# Patient Record
Sex: Female | Born: 1955 | Race: White | Hispanic: No | Smoking: Current every day smoker
Health system: Southern US, Community
[De-identification: ages and names within clinical notes are randomized; demographics above are authoritative.]

## PROBLEM LIST (undated history)

## (undated) DIAGNOSIS — K08109 Complete loss of teeth, unspecified cause, unspecified class: Secondary | ICD-10-CM

## (undated) DIAGNOSIS — Z972 Presence of dental prosthetic device (complete) (partial): Secondary | ICD-10-CM

## (undated) DIAGNOSIS — Z973 Presence of spectacles and contact lenses: Secondary | ICD-10-CM

## (undated) DIAGNOSIS — L905 Scar conditions and fibrosis of skin: Secondary | ICD-10-CM

## (undated) DIAGNOSIS — M797 Fibromyalgia: Secondary | ICD-10-CM

## (undated) DIAGNOSIS — M1990 Unspecified osteoarthritis, unspecified site: Secondary | ICD-10-CM

## (undated) DIAGNOSIS — E785 Hyperlipidemia, unspecified: Secondary | ICD-10-CM

## (undated) DIAGNOSIS — K219 Gastro-esophageal reflux disease without esophagitis: Secondary | ICD-10-CM

## (undated) DIAGNOSIS — K257 Chronic gastric ulcer without hemorrhage or perforation: Secondary | ICD-10-CM

## (undated) DIAGNOSIS — R51 Headache: Secondary | ICD-10-CM

## (undated) DIAGNOSIS — F32A Depression, unspecified: Secondary | ICD-10-CM

## (undated) DIAGNOSIS — K469 Unspecified abdominal hernia without obstruction or gangrene: Secondary | ICD-10-CM

## (undated) DIAGNOSIS — R351 Nocturia: Secondary | ICD-10-CM

## (undated) DIAGNOSIS — A4902 Methicillin resistant Staphylococcus aureus infection, unspecified site: Secondary | ICD-10-CM

## (undated) DIAGNOSIS — K5909 Other constipation: Secondary | ICD-10-CM

## (undated) DIAGNOSIS — F419 Anxiety disorder, unspecified: Secondary | ICD-10-CM

## (undated) DIAGNOSIS — F329 Major depressive disorder, single episode, unspecified: Secondary | ICD-10-CM

## (undated) DIAGNOSIS — I10 Essential (primary) hypertension: Secondary | ICD-10-CM

## (undated) DIAGNOSIS — Z9889 Other specified postprocedural states: Secondary | ICD-10-CM

## (undated) DIAGNOSIS — S069XAA Unspecified intracranial injury with loss of consciousness status unknown, initial encounter: Secondary | ICD-10-CM

## (undated) DIAGNOSIS — R42 Dizziness and giddiness: Secondary | ICD-10-CM

## (undated) DIAGNOSIS — I639 Cerebral infarction, unspecified: Secondary | ICD-10-CM

## (undated) DIAGNOSIS — IMO0001 Reserved for inherently not codable concepts without codable children: Secondary | ICD-10-CM

## (undated) DIAGNOSIS — S069X9A Unspecified intracranial injury with loss of consciousness of unspecified duration, initial encounter: Secondary | ICD-10-CM

## (undated) DIAGNOSIS — K429 Umbilical hernia without obstruction or gangrene: Secondary | ICD-10-CM

## (undated) DIAGNOSIS — G479 Sleep disorder, unspecified: Secondary | ICD-10-CM

## (undated) DIAGNOSIS — Z5189 Encounter for other specified aftercare: Secondary | ICD-10-CM

## (undated) HISTORY — DX: Presence of dental prosthetic device (complete) (partial): K08.109

## (undated) HISTORY — DX: Anxiety disorder, unspecified: F41.9

## (undated) HISTORY — DX: Unspecified osteoarthritis, unspecified site: M19.90

## (undated) HISTORY — DX: Umbilical hernia without obstruction or gangrene: K42.9

## (undated) HISTORY — DX: Chronic gastric ulcer without hemorrhage or perforation: K25.7

## (undated) HISTORY — DX: Complete loss of teeth, unspecified cause, unspecified class: Z97.2

## (undated) HISTORY — DX: Major depressive disorder, single episode, unspecified: F32.9

## (undated) HISTORY — PX: ABDOMINAL HYSTERECTOMY: SHX81

## (undated) HISTORY — DX: Unspecified abdominal hernia without obstruction or gangrene: K46.9

## (undated) HISTORY — DX: Other specified postprocedural states: Z98.890

## (undated) HISTORY — DX: Methicillin resistant Staphylococcus aureus infection, unspecified site: A49.02

## (undated) HISTORY — DX: Presence of spectacles and contact lenses: Z97.3

## (undated) HISTORY — DX: Depression, unspecified: F32.A

## (undated) HISTORY — DX: Fibromyalgia: M79.7

## (undated) HISTORY — DX: Reserved for inherently not codable concepts without codable children: IMO0001

## (undated) HISTORY — PX: APPENDECTOMY: SHX54

## (undated) HISTORY — PX: BACK SURGERY: SHX140

## (undated) HISTORY — DX: Unspecified intracranial injury with loss of consciousness status unknown, initial encounter: S06.9XAA

## (undated) HISTORY — DX: Scar conditions and fibrosis of skin: L90.5

## (undated) HISTORY — DX: Dizziness and giddiness: R42

## (undated) HISTORY — PX: TUBAL LIGATION: SHX77

## (undated) HISTORY — DX: Sleep disorder, unspecified: G47.9

## (undated) HISTORY — DX: Unspecified intracranial injury with loss of consciousness of unspecified duration, initial encounter: S06.9X9A

## (undated) HISTORY — DX: Headache: R51

## (undated) HISTORY — DX: Essential (primary) hypertension: I10

## (undated) HISTORY — DX: Other constipation: K59.09

## (undated) HISTORY — DX: Nocturia: R35.1

## (undated) HISTORY — DX: Gastro-esophageal reflux disease without esophagitis: K21.9

## (undated) HISTORY — DX: Hyperlipidemia, unspecified: E78.5

## (undated) HISTORY — PX: HERNIA REPAIR: SHX51

## (undated) HISTORY — DX: Encounter for other specified aftercare: Z51.89

## (undated) HISTORY — PX: CEREBRAL ANEURYSM REPAIR: SHX164

---

## 1982-07-05 HISTORY — PX: CHOLECYSTECTOMY: SHX55

## 1998-04-08 ENCOUNTER — Ambulatory Visit (HOSPITAL_COMMUNITY): Admission: RE | Admit: 1998-04-08 | Discharge: 1998-04-08 | Payer: Self-pay | Admitting: Specialist

## 1998-04-08 ENCOUNTER — Encounter: Payer: Self-pay | Admitting: Specialist

## 2000-11-22 ENCOUNTER — Encounter: Admission: RE | Admit: 2000-11-22 | Discharge: 2000-11-22 | Payer: Self-pay | Admitting: Neurosurgery

## 2001-03-03 ENCOUNTER — Ambulatory Visit (HOSPITAL_COMMUNITY): Admission: RE | Admit: 2001-03-03 | Discharge: 2001-03-03 | Payer: Self-pay | Admitting: Internal Medicine

## 2001-03-09 ENCOUNTER — Encounter: Admission: RE | Admit: 2001-03-09 | Discharge: 2001-03-09 | Payer: Self-pay | Admitting: Internal Medicine

## 2001-09-19 ENCOUNTER — Encounter: Admission: RE | Admit: 2001-09-19 | Discharge: 2001-09-19 | Payer: Self-pay | Admitting: Internal Medicine

## 2001-09-20 ENCOUNTER — Encounter: Admission: RE | Admit: 2001-09-20 | Discharge: 2001-09-20 | Payer: Self-pay | Admitting: Internal Medicine

## 2001-10-09 ENCOUNTER — Encounter: Payer: Self-pay | Admitting: Specialist

## 2001-10-09 ENCOUNTER — Ambulatory Visit (HOSPITAL_COMMUNITY): Admission: RE | Admit: 2001-10-09 | Discharge: 2001-10-09 | Payer: Self-pay | Admitting: Specialist

## 2001-10-26 ENCOUNTER — Ambulatory Visit (HOSPITAL_COMMUNITY): Admission: RE | Admit: 2001-10-26 | Discharge: 2001-10-26 | Payer: Self-pay | Admitting: Specialist

## 2001-10-26 ENCOUNTER — Encounter: Payer: Self-pay | Admitting: Specialist

## 2001-11-13 ENCOUNTER — Encounter (HOSPITAL_COMMUNITY): Admission: RE | Admit: 2001-11-13 | Discharge: 2001-12-13 | Payer: Self-pay | Admitting: Internal Medicine

## 2001-11-21 ENCOUNTER — Encounter: Admission: RE | Admit: 2001-11-21 | Discharge: 2001-11-21 | Payer: Self-pay | Admitting: Infectious Diseases

## 2002-02-14 ENCOUNTER — Encounter: Admission: RE | Admit: 2002-02-14 | Discharge: 2002-02-14 | Payer: Self-pay | Admitting: Infectious Diseases

## 2002-02-17 ENCOUNTER — Ambulatory Visit (HOSPITAL_COMMUNITY): Admission: RE | Admit: 2002-02-17 | Discharge: 2002-02-17 | Payer: Self-pay | Admitting: Infectious Diseases

## 2002-02-23 ENCOUNTER — Ambulatory Visit (HOSPITAL_COMMUNITY): Admission: RE | Admit: 2002-02-23 | Discharge: 2002-02-23 | Payer: Self-pay | Admitting: Family Medicine

## 2002-02-23 ENCOUNTER — Encounter: Payer: Self-pay | Admitting: Family Medicine

## 2002-04-23 ENCOUNTER — Other Ambulatory Visit: Admission: RE | Admit: 2002-04-23 | Discharge: 2002-04-23 | Payer: Self-pay | Admitting: Dermatology

## 2002-05-03 ENCOUNTER — Ambulatory Visit (HOSPITAL_COMMUNITY): Admission: RE | Admit: 2002-05-03 | Discharge: 2002-05-03 | Payer: Self-pay | Admitting: Internal Medicine

## 2002-05-03 ENCOUNTER — Encounter: Payer: Self-pay | Admitting: Internal Medicine

## 2002-05-08 ENCOUNTER — Encounter: Admission: RE | Admit: 2002-05-08 | Discharge: 2002-05-08 | Payer: Self-pay | Admitting: Infectious Diseases

## 2002-05-08 ENCOUNTER — Ambulatory Visit (HOSPITAL_COMMUNITY): Admission: RE | Admit: 2002-05-08 | Discharge: 2002-05-08 | Payer: Self-pay | Admitting: Internal Medicine

## 2002-05-08 ENCOUNTER — Encounter: Payer: Self-pay | Admitting: Internal Medicine

## 2002-05-11 ENCOUNTER — Encounter: Payer: Self-pay | Admitting: Specialist

## 2002-05-18 ENCOUNTER — Encounter: Payer: Self-pay | Admitting: Specialist

## 2002-05-19 ENCOUNTER — Inpatient Hospital Stay (HOSPITAL_COMMUNITY): Admission: RE | Admit: 2002-05-19 | Discharge: 2002-05-22 | Payer: Self-pay | Admitting: Specialist

## 2002-09-08 ENCOUNTER — Emergency Department (HOSPITAL_COMMUNITY): Admission: EM | Admit: 2002-09-08 | Discharge: 2002-09-09 | Payer: Self-pay | Admitting: Emergency Medicine

## 2002-12-11 ENCOUNTER — Ambulatory Visit (HOSPITAL_COMMUNITY): Admission: RE | Admit: 2002-12-11 | Discharge: 2002-12-11 | Payer: Self-pay | Admitting: Urology

## 2002-12-11 ENCOUNTER — Encounter: Payer: Self-pay | Admitting: Urology

## 2003-03-11 ENCOUNTER — Emergency Department (HOSPITAL_COMMUNITY): Admission: EM | Admit: 2003-03-11 | Discharge: 2003-03-11 | Payer: Self-pay | Admitting: Emergency Medicine

## 2003-03-29 ENCOUNTER — Encounter: Payer: Self-pay | Admitting: Emergency Medicine

## 2003-03-29 ENCOUNTER — Emergency Department (HOSPITAL_COMMUNITY): Admission: EM | Admit: 2003-03-29 | Discharge: 2003-03-29 | Payer: Self-pay

## 2003-03-29 ENCOUNTER — Emergency Department (HOSPITAL_COMMUNITY): Admission: EM | Admit: 2003-03-29 | Discharge: 2003-03-29 | Payer: Self-pay | Admitting: Emergency Medicine

## 2003-06-06 ENCOUNTER — Emergency Department (HOSPITAL_COMMUNITY): Admission: AD | Admit: 2003-06-06 | Discharge: 2003-06-06 | Payer: Self-pay | Admitting: Family Medicine

## 2003-07-24 ENCOUNTER — Emergency Department (HOSPITAL_COMMUNITY): Admission: EM | Admit: 2003-07-24 | Discharge: 2003-07-24 | Payer: Self-pay | Admitting: Emergency Medicine

## 2004-07-05 HISTORY — PX: SPINAL FUSION: SHX223

## 2004-09-24 ENCOUNTER — Ambulatory Visit (HOSPITAL_COMMUNITY): Admission: RE | Admit: 2004-09-24 | Discharge: 2004-09-24 | Payer: Self-pay | Admitting: Family Medicine

## 2004-10-01 ENCOUNTER — Encounter (HOSPITAL_COMMUNITY): Admission: RE | Admit: 2004-10-01 | Discharge: 2004-10-31 | Payer: Self-pay | Admitting: Pediatrics

## 2004-12-28 ENCOUNTER — Inpatient Hospital Stay (HOSPITAL_COMMUNITY): Admission: RE | Admit: 2004-12-28 | Discharge: 2004-12-30 | Payer: Self-pay | Admitting: Obstetrics and Gynecology

## 2004-12-28 ENCOUNTER — Encounter: Payer: Self-pay | Admitting: Obstetrics and Gynecology

## 2005-03-01 ENCOUNTER — Encounter: Admission: RE | Admit: 2005-03-01 | Discharge: 2005-03-01 | Payer: Self-pay | Admitting: Specialist

## 2005-09-21 ENCOUNTER — Ambulatory Visit (HOSPITAL_COMMUNITY): Admission: RE | Admit: 2005-09-21 | Discharge: 2005-09-21 | Payer: Self-pay | Admitting: Family Medicine

## 2005-10-06 ENCOUNTER — Emergency Department (HOSPITAL_COMMUNITY): Admission: EM | Admit: 2005-10-06 | Discharge: 2005-10-06 | Payer: Self-pay | Admitting: Emergency Medicine

## 2006-01-18 ENCOUNTER — Ambulatory Visit (HOSPITAL_COMMUNITY): Admission: RE | Admit: 2006-01-18 | Discharge: 2006-01-18 | Payer: Self-pay | Admitting: Family Medicine

## 2006-01-21 ENCOUNTER — Ambulatory Visit (HOSPITAL_COMMUNITY): Admission: RE | Admit: 2006-01-21 | Discharge: 2006-01-21 | Payer: Self-pay | Admitting: Obstetrics & Gynecology

## 2006-01-24 ENCOUNTER — Ambulatory Visit (HOSPITAL_COMMUNITY): Admission: RE | Admit: 2006-01-24 | Discharge: 2006-01-24 | Payer: Self-pay | Admitting: Family Medicine

## 2006-02-04 ENCOUNTER — Ambulatory Visit: Payer: Self-pay | Admitting: Gastroenterology

## 2006-04-06 ENCOUNTER — Ambulatory Visit (HOSPITAL_COMMUNITY): Admission: RE | Admit: 2006-04-06 | Discharge: 2006-04-06 | Payer: Self-pay | Admitting: Gastroenterology

## 2006-04-13 ENCOUNTER — Ambulatory Visit (HOSPITAL_COMMUNITY): Admission: RE | Admit: 2006-04-13 | Discharge: 2006-04-13 | Payer: Self-pay | Admitting: Gastroenterology

## 2006-12-29 ENCOUNTER — Ambulatory Visit: Payer: Self-pay | Admitting: Gastroenterology

## 2007-01-20 ENCOUNTER — Emergency Department (HOSPITAL_COMMUNITY): Admission: EM | Admit: 2007-01-20 | Discharge: 2007-01-20 | Payer: Self-pay | Admitting: Emergency Medicine

## 2007-03-10 ENCOUNTER — Ambulatory Visit: Payer: Self-pay | Admitting: Gastroenterology

## 2007-04-07 ENCOUNTER — Ambulatory Visit: Payer: Self-pay | Admitting: Gastroenterology

## 2007-04-07 ENCOUNTER — Ambulatory Visit (HOSPITAL_COMMUNITY): Admission: RE | Admit: 2007-04-07 | Discharge: 2007-04-07 | Payer: Self-pay | Admitting: Gastroenterology

## 2007-05-05 ENCOUNTER — Encounter (HOSPITAL_COMMUNITY): Admission: RE | Admit: 2007-05-05 | Discharge: 2007-06-04 | Payer: Self-pay | Admitting: Specialist

## 2007-05-26 ENCOUNTER — Ambulatory Visit: Payer: Self-pay | Admitting: Gastroenterology

## 2008-07-05 HISTORY — PX: EYE SURGERY: SHX253

## 2009-02-13 ENCOUNTER — Ambulatory Visit (HOSPITAL_COMMUNITY): Admission: RE | Admit: 2009-02-13 | Discharge: 2009-02-13 | Payer: Self-pay | Admitting: Ophthalmology

## 2009-04-15 ENCOUNTER — Encounter (HOSPITAL_COMMUNITY): Admission: RE | Admit: 2009-04-15 | Discharge: 2009-05-15 | Payer: Self-pay | Admitting: Specialist

## 2009-06-12 ENCOUNTER — Ambulatory Visit (HOSPITAL_COMMUNITY): Admission: RE | Admit: 2009-06-12 | Discharge: 2009-06-12 | Payer: Self-pay | Admitting: Ophthalmology

## 2009-06-17 ENCOUNTER — Observation Stay (HOSPITAL_COMMUNITY): Admission: RE | Admit: 2009-06-17 | Discharge: 2009-06-17 | Payer: Self-pay | Admitting: Ophthalmology

## 2009-06-19 ENCOUNTER — Observation Stay (HOSPITAL_COMMUNITY): Admission: EM | Admit: 2009-06-19 | Discharge: 2009-06-20 | Payer: Self-pay | Admitting: Emergency Medicine

## 2009-07-28 ENCOUNTER — Emergency Department (HOSPITAL_COMMUNITY): Admission: EM | Admit: 2009-07-28 | Discharge: 2009-07-28 | Payer: Self-pay | Admitting: Emergency Medicine

## 2009-07-30 ENCOUNTER — Emergency Department (HOSPITAL_COMMUNITY): Admission: EM | Admit: 2009-07-30 | Discharge: 2009-07-30 | Payer: Self-pay | Admitting: Emergency Medicine

## 2009-10-31 ENCOUNTER — Emergency Department (HOSPITAL_COMMUNITY): Admission: EM | Admit: 2009-10-31 | Discharge: 2009-10-31 | Payer: Self-pay | Admitting: Emergency Medicine

## 2009-12-28 ENCOUNTER — Inpatient Hospital Stay (HOSPITAL_COMMUNITY)
Admission: EM | Admit: 2009-12-28 | Discharge: 2010-01-01 | Payer: Self-pay | Source: Home / Self Care | Admitting: Emergency Medicine

## 2009-12-31 ENCOUNTER — Ambulatory Visit: Payer: Self-pay | Admitting: Physical Medicine & Rehabilitation

## 2010-01-28 ENCOUNTER — Encounter
Admission: RE | Admit: 2010-01-28 | Discharge: 2010-03-31 | Payer: Self-pay | Admitting: Physical Medicine & Rehabilitation

## 2010-02-03 ENCOUNTER — Encounter
Admission: RE | Admit: 2010-02-03 | Discharge: 2010-02-10 | Payer: Self-pay | Admitting: Physical Medicine & Rehabilitation

## 2010-02-10 ENCOUNTER — Ambulatory Visit: Payer: Self-pay | Admitting: Physical Medicine & Rehabilitation

## 2010-05-13 ENCOUNTER — Encounter
Admission: RE | Admit: 2010-05-13 | Discharge: 2010-05-13 | Payer: Self-pay | Source: Home / Self Care | Attending: Physical Medicine & Rehabilitation | Admitting: Physical Medicine & Rehabilitation

## 2010-06-03 ENCOUNTER — Encounter: Admission: RE | Admit: 2010-06-03 | Discharge: 2010-06-03 | Payer: Self-pay | Admitting: Family Medicine

## 2010-06-09 ENCOUNTER — Ambulatory Visit (HOSPITAL_COMMUNITY): Admission: RE | Admit: 2010-06-09 | Discharge: 2010-06-09 | Payer: Self-pay | Source: Home / Self Care

## 2010-06-19 ENCOUNTER — Encounter
Admission: RE | Admit: 2010-06-19 | Discharge: 2010-06-19 | Payer: Self-pay | Source: Home / Self Care | Attending: General Surgery | Admitting: General Surgery

## 2010-07-16 ENCOUNTER — Inpatient Hospital Stay (HOSPITAL_COMMUNITY)
Admission: RE | Admit: 2010-07-16 | Discharge: 2010-07-17 | Payer: Self-pay | Source: Home / Self Care | Attending: Interventional Radiology | Admitting: Interventional Radiology

## 2010-07-20 LAB — BASIC METABOLIC PANEL
BUN: 7 mg/dL (ref 6–23)
BUN: 4 mg/dL — ABNORMAL LOW (ref 6–23)
CO2: 29 mEq/L (ref 19–32)
CO2: 24 mEq/L (ref 19–32)
Calcium: 9.4 mg/dL (ref 8.4–10.5)
Calcium: 8.6 mg/dL (ref 8.4–10.5)
Chloride: 102 mEq/L (ref 96–112)
Chloride: 107 mEq/L (ref 96–112)
Creatinine, Ser: 0.61 mg/dL (ref 0.4–1.2)
Creatinine, Ser: 0.39 mg/dL — ABNORMAL LOW (ref 0.4–1.2)
GFR calc Af Amer: >60 mL/min (ref >60)
GFR calc Af Amer: >60 mL/min (ref >60)
GFR calc non Af Amer: >60 mL/min (ref >60)
GFR calc non Af Amer: >60 mL/min (ref >60)
Glucose, Bld: 129 mg/dL — ABNORMAL HIGH (ref 70–99)
Glucose, Bld: 149 mg/dL — ABNORMAL HIGH (ref 70–99)
Potassium: 4.1 mEq/L (ref 3.5–5.1)
Potassium: 3.5 mEq/L (ref 3.5–5.1)
Sodium: 140 mEq/L (ref 135–145)
Sodium: 139 mEq/L (ref 135–145)

## 2010-07-20 LAB — GLUCOSE, CAPILLARY
Glucose-Capillary: 154 mg/dL — ABNORMAL HIGH (ref 70–99)
Glucose-Capillary: 163 mg/dL — ABNORMAL HIGH (ref 70–99)
Glucose-Capillary: 260 mg/dL — ABNORMAL HIGH (ref 70–99)
Glucose-Capillary: 128 mg/dL — ABNORMAL HIGH (ref 70–99)
Glucose-Capillary: 162 mg/dL — ABNORMAL HIGH (ref 70–99)
Glucose-Capillary: 145 mg/dL — ABNORMAL HIGH (ref 70–99)

## 2010-07-20 LAB — DIFFERENTIAL
Basophils Absolute: 0 10*3/uL (ref 0.0–0.1)
Basophils Relative: 0 % (ref 0–1)
Eosinophils Absolute: 0.3 10*3/uL (ref 0.0–0.7)
Eosinophils Relative: 4 % (ref 0–5)
Lymphocytes Relative: 21 % (ref 12–46)
Lymphs Abs: 1.8 10*3/uL (ref 0.7–4.0)
Monocytes Absolute: 0.6 10*3/uL (ref 0.1–1.0)
Monocytes Relative: 8 % (ref 3–12)
Neutro Abs: 5.5 10*3/uL (ref 1.7–7.7)
Neutrophils Relative %: 67 % (ref 43–77)

## 2010-07-20 LAB — PLATELET INHIBITION P2Y12
P2Y12 % Inhibition: 6 %
P2Y12 % Inhibition: 0 %
Platelet Function  P2Y12: 205 [PRU] (ref 194–418)
Platelet Function  P2Y12: 259 [PRU] (ref 194–418)
Platelet Function Baseline: 218 [PRU] (ref 194–418)
Platelet Function Baseline: 230 [PRU] (ref 194–418)

## 2010-07-20 LAB — CBC
HCT: 40.5 % (ref 36.0–46.0)
HCT: 35.2 % — ABNORMAL LOW (ref 36.0–46.0)
Hemoglobin: 13.7 g/dL (ref 12.0–15.0)
Hemoglobin: 12 g/dL (ref 12.0–15.0)
MCH: 28.7 pg (ref 26.0–34.0)
MCH: 28.2 pg (ref 26.0–34.0)
MCHC: 33.8 g/dL (ref 30.0–36.0)
MCHC: 34.1 g/dL (ref 30.0–36.0)
MCV: 84.9 fL (ref 78.0–100.0)
MCV: 82.6 fL (ref 78.0–100.0)
Platelets: 159 10*3/uL (ref 150–400)
Platelets: 143 10*3/uL — ABNORMAL LOW (ref 150–400)
RBC: 4.77 MIL/uL (ref 3.87–5.11)
RBC: 4.26 MIL/uL (ref 3.87–5.11)
RDW: 13.9 % (ref 11.5–15.5)
RDW: 13.8 % (ref 11.5–15.5)
WBC: 8.2 10*3/uL (ref 4.0–10.5)
WBC: 8 10*3/uL (ref 4.0–10.5)

## 2010-07-20 LAB — SURGICAL PCR SCREEN
MRSA, PCR: NEGATIVE
Staphylococcus aureus: NEGATIVE

## 2010-07-20 LAB — APTT
aPTT: 27 seconds (ref 24–37)
aPTT: 42 seconds — ABNORMAL HIGH (ref 24–37)

## 2010-07-20 LAB — PROTIME-INR
INR: 0.9 (ref 0.00–1.49)
INR: 1.01 (ref 0.00–1.49)
Prothrombin Time: 12.4 seconds (ref 11.6–15.2)
Prothrombin Time: 13.5 seconds (ref 11.6–15.2)

## 2010-07-20 LAB — HEPARIN LEVEL (UNFRACTIONATED): Heparin Unfractionated: <0.1 IU/mL — ABNORMAL LOW (ref 0.30–0.70)

## 2010-07-25 ENCOUNTER — Encounter: Payer: Self-pay | Admitting: Family Medicine

## 2010-07-26 ENCOUNTER — Encounter: Payer: Self-pay | Admitting: Specialist

## 2010-07-26 ENCOUNTER — Encounter: Payer: Self-pay | Admitting: Family Medicine

## 2010-07-27 NOTE — Discharge Summary (Signed)
Mallory Mendez, Mallory Mendez              ACCOUNT NO.:  [id number]  MEDICAL RECORD NO.:  [id number]          PATIENT TYPE:  INP  LOCATION:  3101                         FACILITY:  [facility]  PHYSICIAN:  Kamden Stanislaw K. Wal[place]ga Hudman, M.D.DATE OF BIRTH:  17-Apr-1956  DATE OF ADMISSION:  07/16/2010 DATE OF DISCHARGE:  07/17/2010                              DISCHARGE SUMMARY   REASON FOR HOSPITALIZATION:  A 55 year old female with history of headaches that worsening since November 2011.  CT angiogram was performed on June 03, 2010, which showed basilar tip aneurysm.  She was referred to Dr. Corliss Skains by Dr. Gerilyn Pilgrim for evaluation and consult.  She was scheduled for angiogram and probable stent placement and coiling of aneurysm to be performed on July 15, 2010.  Basilar artery aneurysm coiling performed on July 16, 2010 without complication.  The patient tolerated well.  Left-sided weakness did occur following procedure.  CT was negative and MRI was negative. Gradual increase in movement over the next several hours.  When the patient was examined by Dr. Corliss Skains at 5:30 p.m. on July 16, 2010, she had much improved movement.  This continued overnight.  The patient reports all fours with good movement equally.  The patient has had slight nausea, but no vomiting.  She is tolerating liquids well and has no other complaints.  Treatment performed on July 16, 1010 was basilar artery aneurysm coiling with Dr. Corliss Skains.  Procedure went without complications.  OBJECTIVE:  Temperature 97.9, blood pressure 122/61, heart rate 80, 100% O2 sat on 2 liters.  Urine output on July 16, 2010 was 2.6 liters, on July 17, 2010 it was 475 mL and yellow.  Labs on July 17, 2010; potassium 3.5, BUN 4, creatinine 0.39, WBC 8, hemoglobin 12, hematocrit 35.2.  PHYSICAL EXAMINATION:   GENERAL:  Extraocular movements intact.  Alert and oriented; the patient is slightly sleepy.  She puffs cheeks  equally, smiles equally.  Face is symmetrical.  Full range of motion in all fours; good grips; push in both feet with left being slightly weaker on grip and push.  Right groin nontender.  No bleeding, no hematoma.  HEART:  Regular rate and rhythm without murmur. LUNGS:  Clear to auscultation. ABDOMEN:  Soft, positive bowel sounds and nontender and no masses.  DISCHARGE MEDICATIONS: 1. Plavix 75 mg. 2. Ambien 10 mg. 3. Amlodipine 10 mg. 4. Humalog 1-10 units. 5. Imipramine 50 mg. 6. Lantus insulin 21 units. 7. Lipitor 40 mg. 8. Lisinopril 20 mg. 9. Lubiprostone mcg. 10.MiraLax daily. 11.MS Contin. 12.Neurontin. 13.Nexium 40 mg. 14.Percocet 10/325 mg. 15.Prefest 1 mg. 16.Pristiq 100 mg. 17.Torsemide 20 mg. 18.Xanax 1 mg. 19.Aspirin 81 mg.  CONDITION AT DISCHARGE:  Stable and improved.  INSTRUCTIONS: 1. The patient is to be restful at home for the next 48 hours.  She     can get around her home and get to the restroom and kitchen. 2. She is to take Plavix 75 mg and aspirin 81 mg daily.3. She is not to drive for 2 weeks. 4. No heavy lifting over 10 pounds or strenuous exercise for 2 weeks. 5. No bending or stooping for 2 weeks.  6. She is to follow up with Dr. Corliss Skains, this appointment is     scheduled for July 30, 2010, which is a Thursday at 3 p.m. 7. She will have a repeat angiogram in 3 months.     Beatrice Cellar Carleene Mains, P.A.   ______________________________ Grandville Silos Corliss Skains, M.D.    PAT/MEDQ  D:  07/20/2010  T:  07/20/2010  Job:  [id number]  Electronically Signed by Ralene Muskrat P.A. on 07/22/2010 04:26:21 PM Electronically Signed by Julieanne Cotton M.D. on 07/27/2010 11:22:57 AM

## 2010-07-27 NOTE — H&P (Signed)
  Mallory Mendez, VALLIN              ACCOUNT NO.:  [id number]  MEDICAL RECORD NO.:  [id number]          PATIENT TYPE:  INP  LOCATION:  3101                         FACILITY:  [facility]  PHYSICIAN:  Gerrard Crystal K. [name], M.D.DATE OF BIRTH:  [birth year]  DATE OF ADMISSION:  07/16/2010 DATE OF DISCHARGE:                             HISTORY & PHYSICAL   This is a 55 year old female with history of headaches, worsening since November 2011.  CT angiogram was performed on June 03, 2010, which showed basilar tip aneurysm.  She was referred to Dr. Corliss Skains by Dr. Gerilyn Pilgrim for evaluation and consults.  This was performed on June 09, 2010.  She is now scheduled today for possible stent and possible coiling of aneurysm.  The patient is agreeable to proceed.  PAST MEDICAL HISTORY: 1. Diabetes. 2. Hyperlipidemia. 3. Hypertension. 4. Diabetic retinopathy. 5. Pancreatitis. 6. Anxiety/depression. 7. History of MRSA post back surgery. 8. Gastroparesis. 9. Fibromyalgia. 10.Previous head injury. 11.Ventral hernia. 12.Smoker.  SURGICAL HISTORY: 1. Cholecystectomy. 2. Hysterectomy. 3. Six back surgeries. 4. Hernia repair. 5. Cataract surgery.  ALLERGIES: 1. LATEX. 2. LYRICA. 3. CYMBALTA.  MEDICATIONS: 1. Lipitor 40 mg. 2. Prefest 1 mg. 3. Percocet 10/325 mg. 4. Nexium 40 mg. 5. Neurontin 800 mg. 6. MS Contin 30 mg. 7. Humalog. 8. Lantus 21 units. 9. Imipramine 50 mg. 10.MiraLax. 11.Lubiprostone. 12.Lisinopril 20 mg. 13.Amlodipine 10 mg. 14.Ambien 10 mg. 15.Pristiq XR 100 mg. 16.Torsemide 20 mg. 17.Xanax 1 mg. 18.Plavix 75 mg. 19.Aspirin 81 mg.  OBJECTIVE:  Extraocular movements intact; alert, appropriate.  Face symmetrical.  Tongue is midline; neuro intact.  HEART:  Regular rate and rhythm without murmur.  LUNGS:  Clear to auscultation.  ABDOMEN:  Soft, positive bowel sounds, nontender, no masses.  EXTREMITIES:  Full range of motion.  Gait is  steady.  ASSESSMENT:  Basilar artery tip aneurysm.  PLAN:  Scheduled for a stent/coiling of aneurysm today with Dr. Corliss Skains.  To be admitted to [address] Neuro ICU overnight.     Bliss Cellar Carleene Mains, P.A.   ______________________________ Grandville Silos Corliss Skains, M.D.    PAT/MEDQ  D:  07/16/2010  T:  07/17/2010  Job:  [id number]  Electronically Signed by Ralene Muskrat P.A. on 07/22/2010 04:24:39 PM Electronically Signed by Julieanne Cotton M.D. on 07/27/2010 11:22:55 AM

## 2010-07-30 LAB — POCT ACTIVATED CLOTTING TIME
Activated Clotting Time: 187 s
Activated Clotting Time: 199 s

## 2010-07-31 ENCOUNTER — Ambulatory Visit (HOSPITAL_COMMUNITY)
Admission: RE | Admit: 2010-07-31 | Discharge: 2010-07-31 | Payer: Self-pay | Source: Home / Self Care | Attending: Interventional Radiology | Admitting: Interventional Radiology

## 2010-07-31 NOTE — Consult Note (Addendum)
[name], [name] NO.:  [id number]  MEDICAL RECORD NO.:  [id number]          PATIENT TYPE:  INP  LOCATION:  3101                         FACILITY:  [facility]  PHYSICIAN:  Marlan Palau, M.D.  DATE OF BIRTH:  02-24-1956  DATE OF CONSULTATION:  07/16/2010 DATE OF DISCHARGE:                                CONSULTATION   HISTORY OF PRESENT ILLNESS:  Mallory Mendez is a 55 year old white female born, 04/16/1956, with a history of diabetes, hypertension, and dyslipidemia.  The patient was found to have a basilar tip aneurysm and has had occipital headaches and was admitted for coiling of the aneurysm.  This procedure was done today.  The aneurysm had to be stented and then was coiled.  The patient seemed to be doing well initially.  Following the procedure about 1:30 p.m. today, the patient was noted to have onset of increased headache and has had some left- sided weakness.  The patient was noted to have some drowsiness as well and was given fentanyl and some steroids.  The patient underwent a CT scan of the brain that was unremarkable and an MRI scan was subsequently done and showed no evidence of hemorrhage or stroke.  The patient was transferred to Neuroscience Intensive Care Unit.  Neurology was asked to see the patient as a code stroke.  The patient was not a tPA candidate secondary to the use of heparin bolus and the fact that no stroke was apparent on MRI.  PAST MEDICAL HISTORY: 1. New onset of lethargy and left-sided weakness. 2. 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 3. Diabetes. 4. Hypertension. 5. Dyslipidemia. 6. Diabetic retinopathy. 7. History of anxiety and depression. 8. Pancreatitis. 9. Chronic low back pain. 10.Fibromyalgia. 11.Gastroparesis. 12.Glaucoma. 13.Cataract surgery bilaterally. 14.Lumbosacral spine surgery on several occasions. 15.Bilateral tubal ligation. 16.History of  vertigo.  ALLERGIES:  The patient has allergies to LATEX, LYRICA and CYMBALTA.  HABITS:  Smokes 1/2-pack cigarettes daily.  Does not drink alcohol.  SOCIAL HISTORY:  The patient is separated, has 5 children, lives in [place], [place] and is on disability.  FAMILY MEDICAL HISTORY:  Mother is living and has a history of breast cancer.  Father died with liver cancer.  REVIEW OF SYSTEMS:  Difficult to obtain.  The patient does report some headache and left-sided numbness.  The patient denies shortness of breath, chest pain, abdominal pain, or nausea.  PHYSICAL EXAMINATION:  VITAL SIGNS:  Blood pressure is 130/88, heart rate 92, respiratory rate 20, and temperature afebrile. GENERAL:  This patient is a fairly well-developed white female who is sleepy at the time examination. HEENT:  Head is atraumatic.  Eyes:  Pupils are round and reactive to light. NECK:  Supple.  No carotid bruits noted. RESPIRATORY:  Clear. CARDIOVASCULAR:  Regular rate and rhythm.  No obvious murmurs or rubs noted. EXTREMITIES:  Without significant edema. NEUROLOGIC:  Cranial nerves as above.  Facial symmetry is present.  The patient has slight dysarthria.  Has full extraocular movements.  Visual fields are full.  Speech is not aphasic.  The patient notes  decreased pinprick sensation on the left lower face compared to right.  The patient has some drift with the left arm and left leg as compared to the right.  Decreased pinprick sensation on the left arm and left leg as compared to the right.  Vibratory sensation is more symmetric.  The patient has fair finger-nose-finger and heel-to-shin bilaterally.  Gait was not tested.  The patient has some questionable extinction to double simultaneous stimulation on the left as compared to right.  NIH stroke scale score was 6.  LABORATORY VALUES:  White count of 8.2, hemoglobin 13.7, hematocrit 40.5, MCV 84.9, and platelets of 159.  INR 0.90.  Sodium of  140, potassium 4.1, chloride of 102, CO2 29, glucose 129, BUN 7, creatinine 0.61, and calcium 9.4.  IMPRESSION: 1. History basilar tip aneurysm status post coiling procedure today. 2. Onset of headache, left-sided weakness, numbness. 3. Diabetes. 4. Hypertension. 5. Dyslipidemia.  This patient does have risk factors for stroke.  The patient, however, has not had any evidence of a stroke by MRI, but he does have some focal deficits on clinical examination.  Given the increasing headache and the coiling procedure, we need to consider the possibility of vasospasm or migrainous type of an event resulting in some focal symptoms.  At this point, the patient will be supported medically and be continued on heparin.  The patient will be followed clinically while in-house.  If symptoms persist, a repeat MRI can be done.  No indication for tPA given the fact the patient got a heparin bolus and that no stroke is apparent on MRI.  We will follow the patient's clinical course while in-house.  Thank you very much.    Marlan Palau, M.D.    CKW/MEDQ  D:  07/16/2010  T:  07/17/2010  Job:  [id number]  cc:   [facility]  Electronically Signed by Thana Farr M.D. on 07/31/2010 09:21:07 AM

## 2010-09-17 ENCOUNTER — Encounter (HOSPITAL_COMMUNITY)
Admission: RE | Admit: 2010-09-17 | Discharge: 2010-09-17 | Disposition: A | Discharge status: Home / Self Care | Payer: Medicare Other | Source: Ambulatory Visit | Attending: General Surgery | Admitting: General Surgery

## 2010-09-17 LAB — COMPREHENSIVE METABOLIC PANEL
Albumin: 4 g/dL (ref 3.5–5.2)
Alkaline Phosphatase: 135 U/L — ABNORMAL HIGH (ref 39–117)
BUN: 7 mg/dL (ref 6–23)
Creatinine, Ser: 0.56 mg/dL (ref 0.4–1.2)
Glucose, Bld: 116 mg/dL — ABNORMAL HIGH (ref 70–99)
Potassium: 4.5 mEq/L (ref 3.5–5.1)
Total Protein: 6.7 g/dL (ref 6.0–8.3)

## 2010-09-17 LAB — DIFFERENTIAL
Eosinophils Absolute: 0.2 10*3/uL (ref 0.0–0.7)
Eosinophils Relative: 2 % (ref 0–5)
Lymphocytes Relative: 15 % (ref 12–46)
Lymphs Abs: 1.4 10*3/uL (ref 0.7–4.0)
Monocytes Absolute: 0.5 10*3/uL (ref 0.1–1.0)
Monocytes Relative: 5 % (ref 3–12)

## 2010-09-17 LAB — CBC
HCT: 40.5 % (ref 36.0–46.0)
MCH: 28.3 pg (ref 26.0–34.0)
MCHC: 34.1 g/dL (ref 30.0–36.0)
MCV: 83.2 fL (ref 78.0–100.0)
Platelets: 147 10*3/uL — ABNORMAL LOW (ref 150–400)
RDW: 13.7 % (ref 11.5–15.5)
WBC: 9.5 10*3/uL (ref 4.0–10.5)

## 2010-09-17 LAB — SURGICAL PCR SCREEN
MRSA, PCR: NEGATIVE
Staphylococcus aureus: NEGATIVE

## 2010-09-20 LAB — BASIC METABOLIC PANEL
CO2: 25 mEq/L (ref 19–32)
Calcium: 8.6 mg/dL (ref 8.4–10.5)
Chloride: 109 mEq/L (ref 96–112)
Creatinine, Ser: 0.53 mg/dL (ref 0.4–1.2)
GFR calc Af Amer: >60 mL/min (ref >60)
GFR calc non Af Amer: >60 mL/min (ref >60)
Glucose, Bld: 114 mg/dL — ABNORMAL HIGH (ref 70–99)
Potassium: 3.8 mEq/L (ref 3.5–5.1)
Sodium: 136 mEq/L (ref 135–145)
Sodium: 140 mEq/L (ref 135–145)

## 2010-09-20 LAB — URINALYSIS, ROUTINE W REFLEX MICROSCOPIC
Bilirubin Urine: NEGATIVE
Ketones, ur: NEGATIVE mg/dL
Leukocytes, UA: NEGATIVE
Nitrite: NEGATIVE
Specific Gravity, Urine: 1.02 (ref 1.005–1.030)
Urobilinogen, UA: 0.2 mg/dL (ref 0.0–1.0)
pH: 6 (ref 5.0–8.0)

## 2010-09-20 LAB — GLUCOSE, CAPILLARY
Glucose-Capillary: 130 mg/dL — ABNORMAL HIGH (ref 70–99)
Glucose-Capillary: 132 mg/dL — ABNORMAL HIGH (ref 70–99)
Glucose-Capillary: 132 mg/dL — ABNORMAL HIGH (ref 70–99)
Glucose-Capillary: 161 mg/dL — ABNORMAL HIGH (ref 70–99)
Glucose-Capillary: 162 mg/dL — ABNORMAL HIGH (ref 70–99)
Glucose-Capillary: 176 mg/dL — ABNORMAL HIGH (ref 70–99)
Glucose-Capillary: 195 mg/dL — ABNORMAL HIGH (ref 70–99)
Glucose-Capillary: 221 mg/dL — ABNORMAL HIGH (ref 70–99)
Glucose-Capillary: 225 mg/dL — ABNORMAL HIGH (ref 70–99)

## 2010-09-20 LAB — DIFFERENTIAL
Basophils Relative: 0 % (ref 0–1)
Eosinophils Relative: 0 % (ref 0–5)
Lymphocytes Relative: 20 % (ref 12–46)
Lymphs Abs: 1.1 10*3/uL (ref 0.7–4.0)
Monocytes Absolute: 0.6 10*3/uL (ref 0.1–1.0)
Monocytes Relative: 8 % (ref 3–12)
Monocytes Relative: 6 % (ref 3–12)
Neutro Abs: 4 10*3/uL (ref 1.7–7.7)
Neutro Abs: 7.7 10*3/uL (ref 1.7–7.7)
Neutrophils Relative %: 71 % (ref 43–77)

## 2010-09-20 LAB — CBC
HCT: 36 % (ref 36.0–46.0)
Hemoglobin: 12.2 g/dL (ref 12.0–15.0)
Hemoglobin: 12.7 g/dL (ref 12.0–15.0)
MCHC: 35.4 g/dL (ref 30.0–36.0)
MCV: 83.6 fL (ref 78.0–100.0)
RBC: 4.24 MIL/uL (ref 3.87–5.11)
WBC: 5.6 10*3/uL (ref 4.0–10.5)

## 2010-09-20 LAB — RAPID URINE DRUG SCREEN, HOSP PERFORMED
Amphetamines: NOT DETECTED
Benzodiazepines: NOT DETECTED

## 2010-09-20 LAB — URINE MICROSCOPIC-ADD ON

## 2010-09-20 LAB — ETHANOL: Alcohol, Ethyl (B): <5 mg/dL (ref 0–10)

## 2010-09-21 ENCOUNTER — Other Ambulatory Visit: Payer: Self-pay | Admitting: General Surgery

## 2010-09-21 ENCOUNTER — Inpatient Hospital Stay (HOSPITAL_COMMUNITY)
Admission: RE | Admit: 2010-09-21 | Discharge: 2010-09-24 | DRG: 355 | Disposition: A | Discharge status: Home / Self Care | Payer: Medicare Other | Source: Ambulatory Visit | Attending: General Surgery | Admitting: General Surgery

## 2010-09-21 DIAGNOSIS — E119 Type 2 diabetes mellitus without complications: Secondary | ICD-10-CM | POA: Diagnosis present

## 2010-09-21 DIAGNOSIS — Z7982 Long term (current) use of aspirin: Secondary | ICD-10-CM

## 2010-09-21 DIAGNOSIS — K432 Incisional hernia without obstruction or gangrene: Principal | ICD-10-CM | POA: Diagnosis present

## 2010-09-21 DIAGNOSIS — Z01818 Encounter for other preprocedural examination: Secondary | ICD-10-CM

## 2010-09-21 DIAGNOSIS — G8929 Other chronic pain: Secondary | ICD-10-CM | POA: Diagnosis present

## 2010-09-21 DIAGNOSIS — Z794 Long term (current) use of insulin: Secondary | ICD-10-CM

## 2010-09-21 DIAGNOSIS — Z01812 Encounter for preprocedural laboratory examination: Secondary | ICD-10-CM

## 2010-09-21 LAB — GLUCOSE, CAPILLARY: Glucose-Capillary: 155 mg/dL — ABNORMAL HIGH (ref 70–99)

## 2010-09-22 LAB — BASIC METABOLIC PANEL
Calcium: 8.6 mg/dL (ref 8.4–10.5)
Creatinine, Ser: 0.4 mg/dL (ref 0.4–1.2)
GFR calc Af Amer: >60 mL/min (ref >60)

## 2010-09-22 LAB — GLUCOSE, CAPILLARY: Glucose-Capillary: 127 mg/dL — ABNORMAL HIGH (ref 70–99)

## 2010-09-22 LAB — CBC
MCH: 28.2 pg (ref 26.0–34.0)
MCHC: 33.8 g/dL (ref 30.0–36.0)
Platelets: 160 10*3/uL (ref 150–400)
RDW: 13.6 % (ref 11.5–15.5)

## 2010-09-23 LAB — GLUCOSE, CAPILLARY
Glucose-Capillary: 182 mg/dL — ABNORMAL HIGH (ref 70–99)
Glucose-Capillary: 80 mg/dL (ref 70–99)
Glucose-Capillary: 106 mg/dL — ABNORMAL HIGH (ref 70–99)

## 2010-09-24 LAB — GLUCOSE, CAPILLARY: Glucose-Capillary: 120 mg/dL — ABNORMAL HIGH (ref 70–99)

## 2010-10-05 LAB — COMPREHENSIVE METABOLIC PANEL
Albumin: 4 g/dL (ref 3.5–5.2)
Alkaline Phosphatase: 112 U/L (ref 39–117)
BUN: 9 mg/dL (ref 6–23)
Chloride: 103 mEq/L (ref 96–112)
Creatinine, Ser: 0.4 mg/dL (ref 0.4–1.2)
GFR calc non Af Amer: >60 mL/min (ref >60)
Glucose, Bld: 222 mg/dL — ABNORMAL HIGH (ref 70–99)
Potassium: 3.6 mEq/L (ref 3.5–5.1)
Total Bilirubin: 0.5 mg/dL (ref 0.3–1.2)

## 2010-10-05 LAB — DIFFERENTIAL
Basophils Absolute: 0 10*3/uL (ref 0.0–0.1)
Basophils Relative: 0 % (ref 0–1)
Lymphocytes Relative: 7 % — ABNORMAL LOW (ref 12–46)
Monocytes Absolute: 0.2 10*3/uL (ref 0.1–1.0)
Neutro Abs: 10 10*3/uL — ABNORMAL HIGH (ref 1.7–7.7)
Neutrophils Relative %: 92 % — ABNORMAL HIGH (ref 43–77)

## 2010-10-05 LAB — GLUCOSE, CAPILLARY
Glucose-Capillary: 138 mg/dL — ABNORMAL HIGH (ref 70–99)
Glucose-Capillary: 201 mg/dL — ABNORMAL HIGH (ref 70–99)

## 2010-10-05 LAB — CBC
HCT: 40.9 % (ref 36.0–46.0)
Hemoglobin: 14.2 g/dL (ref 12.0–15.0)
MCV: 84.2 fL (ref 78.0–100.0)
Platelets: 160 10*3/uL (ref 150–400)
WBC: 10.9 10*3/uL — ABNORMAL HIGH (ref 4.0–10.5)

## 2010-10-05 LAB — URINALYSIS, ROUTINE W REFLEX MICROSCOPIC
Bilirubin Urine: NEGATIVE
Nitrite: NEGATIVE
Protein, ur: 30 mg/dL — AB
Specific Gravity, Urine: 1.016 (ref 1.005–1.030)
Urobilinogen, UA: 0.2 mg/dL (ref 0.0–1.0)

## 2010-10-05 LAB — URINE MICROSCOPIC-ADD ON

## 2010-10-05 LAB — LIPASE, BLOOD: Lipase: 17 U/L (ref 11–59)

## 2010-10-05 NOTE — Op Note (Signed)
[name], [name]              ACCOUNT NO.:  [id number]  MEDICAL RECORD NO.:  [id number]           PATIENT TYPE:  O  LOCATION:  5118                         FACILITY:  [facility]  PHYSICIAN:  Anselm Pancoast. Breylan Lefevers, M.D.DATE OF BIRTH:  [birth year]  DATE OF PROCEDURE:  09/21/2010 DATE OF DISCHARGE:                              OPERATIVE REPORT   PREOPERATIVE DIAGNOSIS:  Recurrent incisional hernia, lower abdomen, previous repair with laparoscopic mesh repair, general anesthesia.  OPERATION:  Takedown of previous repaired incisional hernia and repair with [place] Prolene mesh with 3 x 6 inches area, general anesthesia.  HISTORY:  Mallory Mendez is a 55 year old Caucasian female, who has had a long, complex medical history.  She is a diabetic and she has had significant problems with back surgery, multiple surgeries for her back, fusion at 6 levels, and she I think it was in 2003 had her first procedure that was operated from the front and back and she developed an incisional hernia.  She then had a hysterectomy about 2-3 years later and then has had several repairs when trying to repair the hernia and then a laparoscopic hernia repair was performed down over in [place] area approximately 5 or 6 years ago.  She is on chronic narcotics.  She has got a history also of aneurysm within her head that was originally repaired with coils radiologically and she about 6 months ago had a loop of bowel that presented up above the fascia where she got a recurrent hernia in the lower abdomen, was reduced in emergency room down in [facility] area.  They suggested that she return to her regular physician. She lives in [place], but her mother lives down in the [place], [place], and I saw her back in the office in January at which time, she was not having any obstructive-type symptoms.  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  I have seen her back in the office, and she wants to go ahead and proceed with repair of this.  I recommended that we not plan on doing it laparoscopically since she has got a piece of mesh that was all up in the hernia sac, where it is broken down and I think it will be necessary to actually remove the previous mesh and then decide what type would be the best way of repairing it.  The patient is a diabetic.  She is on Percocet and Xanax and other medications chronically and did have a mechanical bowel prep including a mag citrate and clear liquids yesterday, which supposedly cleaned her out nicely. She is here for this planned procedure.  Her lab studies were done on Friday and at that time, her hematocrit was normal.  Her white count was normal.  Her glucose was 116 and her liver function studies were normal.  Preop, she was given a gram of Ancef.  She has got PAS stockings.  The patient had been identified and then taken  back to the operative suite. Induction of general anesthesia, endotracheal tube and then taken back to the OR,  Foley catheter was inserted.  She is allergic to LATEX and then the abdomen was prepped with the iodine with a standard prep and then draped in a sterile manner.  I made a small incision in the midportion of the old previous lower midline incision.  Sharp dissection down through the skin and the subcutaneous tissue identifying the hernia sac, and then I very carefully opened into this.  There was a piece of omentum that was up anterior to the mesh.  You could see where it had broken down, predominantly on the right and the superior side, and we made our skin incision as small as I could safely work through.  After we reduced the omentum and the little pedicles where it was tied, sutured up or actually adhered up to where the mesh was sutured to the under abdomen area,  was freed up circumferentially.  I went ahead and removed the old mesh.  I am not sure whether it is a piece of Gore-Tex or exactly what.  It was not incorporated into the abdominal wall, but was kind of up in the actual hernia sac.  After the mesh was removed, the small bowel fortunately, she has not that much of adhesion former, a lap was placed over it and then on, I decided to remove the hernia sac in all areas and then she also has a small fascial defect actually up the umbilicus.  I freed the fascia right on up near the umbilicus defect and then elected to since the hernia is not all that big, I think it would be best instead of trying to put a piece of mesh intraperitoneally, I do not think I can get anything actually within the abdominal layers since she has had so many previous surgeries, but I actually closed the full-thickness with interrupted sutures of #1 Novafil and the fascia comes together without too much or basically minimal tension.  I then used a piece of Prolene mesh 3 x 6 inches and incorporated the sutures that I had used for closure of the fascia bringing it through the middle of the mesh and then sutured the edges of the mesh down circumferentially with interrupted sutures of 2-0 Vicryl. The subcutaneous tissue was then closed over the mesh with several two layers of superficial layer and deep layer using 2-0 Vicryl and I put a 10-mm Blake drain in over the fascia and brought it out high.  The skin was then closed with staples and the abdominal binder, Vaseline gauze was placed around the incision where the drain comes out and then I used the reservoir hooked up and we got a water seal.  The 4 x 4's opened up, was placed over the gauze and then ABDs and abdominal binder placed. The patient tolerated the procedure nicely.  We will try not to use a nasogastric tube since it will not allow bowel manipulation, but not restarting on a diet today.  She can have ice chips  only.  The patient will continue PAS stockings.  We will start the Lovenox tomorrow and go with PCA morphine, hopefully on a low dose.  I am sure she will want a full dose because of her chronic narcotic use.  The patient's  estimated blood loss was minimal, and I think she should be able to go to the floor postoperatively.     Anselm Pancoast. Zachery Dakins, M.D.  WJW/MEDQ  D:  09/21/2010  T:  09/22/2010  Job:  [id number]  Electronically Signed by Consuello Bossier M.D. on 10/05/2010 03:55:17 PM

## 2010-10-05 NOTE — Discharge Summary (Signed)
NAMEAHTZIRI, JEFFRIES              ACCOUNT NO.:  [id number]  MEDICAL RECORD NO.:  [id number]           PATIENT TYPE:  I  LOCATION:  5118                         FACILITY:  [facility]  PHYSICIAN:  Anselm Pancoast. Jeanna Giuffre, M.D.DATE OF BIRTH:  [birth year]  DATE OF ADMISSION:  09/21/2010 DATE OF DISCHARGE:  09/24/2010                              DISCHARGE SUMMARY   DISCHARGING DIAGNOSES: 1. Recurrent incisional hernia. 2. History of diabetes. 3. Chronic pain problem with multiple back orthopedic previous     surgeries. 4. History of hypertension.  OPERATION:  Repair of recurrent incisional hernia in lower abdomen with mesh placed extraperitoneally.  HISTORY:  Mallory Mendez is a 55 year old Caucasian female who has a long complex medical history.  She is a diabetic and is on insulin four times a day and her problems with pain management, etc., first started with a back issue for which she had multiple back surgeries, firststarted in 2003.  She had an operation from posterior and anterior approach, had problems postoperatively, I think it was reoperated on, some of the hardware was removed.  Then, she had a hysterectomy and then has had other problems.  She has also had a problem of cerebral aneurysm that has been treated with coils; and because of progression of this, she had the last coil placed approximately 3 months ago.  I first saw her in the office about 4 months ago when she was referred in from [facility] where she was visiting her mother.  Mother lives down in [place] and had a problem with a painful bulge in the lower abdomen, went to the emergency room in [facility], they did a plain x-rays saw that she had a hernia.  They were able to manually reduce it and suggested that she see a general surgeon.  She had had a previous hernia repair with mesh placed, I think it was laparoscopically approached about 5 or 6 years ago in [place].  This area of course had given way and I saw  her and obtained a CT and the CT shows she has a fascia defect separation in the lower abdomen below the umbilicus with a piece of mesh as kind of within the actual hernia sac with the loop of bowel actually above the mesh but within the actual hernia sac, it was reducible; and when I saw her, she had been scheduled for a repeat coil placement in the cerebral aneurysm and this was done approximately 2 months ago and everything went successfully.  She is followed by one of the chronic pain medications for her narcotics and pain management which are as follows.  1. Ibuprofen 800 mg t.i.d. on p.r.n. basis. 2. She takes Phenergan 25 mg every 6 hours p.r.n. 3. She is on scopolamine patches 1.5 mg every 3 days. 4. She takes adult aspirin daily. 5. She is on Xanax three times a day. 6. She is on Percocet 10/325 one tablet every 8 hours p.r.n. 7. She is on OxyContin. 8. She is on MiraLax one dose daily. 9. MS Contin 30 mg t.i.d. 10.As far as for her diabetes, she is on 21 units of  Lantus at bedtime     or 8 o'clock and then she is on sliding scale basically at     mealtimes and she is using ranges from about 3-6 or 7 units t.i.d. 11.She takes Ambien at bedtime. 12.She takes amlodipine 10 mg daily.  I discussed with her in the office about the pain medication.  I thought it was excessive and she said she would talk to her pain doctor about that but also stated with her preoperatively that we would not add anymore pain medications postoperatively since I think that already she is more than ideal.  She was admitted, taken to surgery, Dr. Abbey Chatters assisted, and we were able to free up this hernia sac, the mesh that had separated from the anchoring muscle on the right side was what would allow the intestines to basically rotate above the mesh, and we removed the hernia sac, removed the mesh, and then with basically good mobilization, I was able to bring the midline fascia together, the  neck of the hernia was not that far apart; and instead of putting mesh back in the intraperitoneal cavity, I thought it would be best to try to close the fashion using the onlay mesh which I did with Prolene mesh 3 x 6 inches area.  Postop, she has done reasonably well.  We had PCA morphine in reduced dose, the first night of a Foley catheter.  The catheter has been removed.  She was able to void.  She was on 24 hours of Ancef and then her diet has been advanced to basically her diabetic diet.  She has not actually really had a good bowel movement.  We had a good mag citrate, bowel was cleaned out prior to surgery.  Her incision looks like is doing fine.  There was one Blake drain that has been removed with the incision painted with Betadine.  A ABD  and abdominal binder that has been narrowed to fit her proportionally.  She is ready for discharge.  Her sugars have not been a significant issue during this hospitalization.  She has ranged from about 120-175 and then she says this is a kind of typical for her and she is well adapted managing her sugars.  She is back on the MiraLax and will be taking all of her usual medications with my hopes that she can taper these down and not a plan of increasing or adding further narcotics.  She appears comfortable. She is up ambulating, will see me in the office every Tuesday or Wednesday for wound check, and we will start taking out the staples. She can shower on Saturday, not to get into a tub until after all the staples are out, and so far the incision appears to be healing nicely. She has been on Lovenox during this hospitalization and is ready for discharge in improved condition at this time.  The path report showed hernia sac in old mesh and it looked as this was probably a piece of cortex and it was a very kind of canvassy type of  mesh that had no incorporation of scar tissue and it probably leading to its failure.     Anselm Pancoast. Zachery Dakins,  M.D.     WJW/MEDQ  D:  09/24/2010  T:  09/25/2010  Job:  [id number]  Electronically Signed by Consuello Bossier M.D. on 10/05/2010 03:55:11 PM

## 2010-10-06 LAB — BASIC METABOLIC PANEL
CO2: 26 mEq/L (ref 19–32)
Chloride: 103 mEq/L (ref 96–112)
Creatinine, Ser: 0.57 mg/dL (ref 0.4–1.2)
GFR calc Af Amer: >60 mL/min (ref >60)
Sodium: 138 mEq/L (ref 135–145)

## 2010-10-06 LAB — CBC
Hemoglobin: 13.2 g/dL (ref 12.0–15.0)
MCHC: 35.9 g/dL (ref 30.0–36.0)
MCV: 83.2 fL (ref 78.0–100.0)
RBC: 4.43 MIL/uL (ref 3.87–5.11)
WBC: 6.7 10*3/uL (ref 4.0–10.5)

## 2010-10-06 LAB — GLUCOSE, CAPILLARY
Glucose-Capillary: 107 mg/dL — ABNORMAL HIGH (ref 70–99)
Glucose-Capillary: 112 mg/dL — ABNORMAL HIGH (ref 70–99)
Glucose-Capillary: 115 mg/dL — ABNORMAL HIGH (ref 70–99)
Glucose-Capillary: 90 mg/dL (ref 70–99)

## 2010-10-06 LAB — POCT I-STAT GLUCOSE
Glucose, Bld: 116 mg/dL — ABNORMAL HIGH (ref 70–99)
Operator id: 219281

## 2010-10-11 LAB — CBC
Platelets: 175 10*3/uL (ref 150–400)
RDW: 14.2 % (ref 11.5–15.5)
WBC: 8.6 10*3/uL (ref 4.0–10.5)

## 2010-10-11 LAB — GLUCOSE, CAPILLARY
Glucose-Capillary: 113 mg/dL — ABNORMAL HIGH (ref 70–99)
Glucose-Capillary: 94 mg/dL (ref 70–99)

## 2010-10-11 LAB — BASIC METABOLIC PANEL WITH GFR
BUN: 14 mg/dL (ref 6–23)
CO2: 30 meq/L (ref 19–32)
Calcium: 9.3 mg/dL (ref 8.4–10.5)
Chloride: 103 meq/L (ref 96–112)
Creatinine, Ser: 0.56 mg/dL (ref 0.4–1.2)
GFR calc Af Amer: >60 mL/min (ref >60)
GFR calc non Af Amer: >60 mL/min (ref >60)
Glucose, Bld: 111 mg/dL — ABNORMAL HIGH (ref 70–99)
Potassium: 4.5 meq/L (ref 3.5–5.1)
Sodium: 139 meq/L (ref 135–145)

## 2010-10-28 ENCOUNTER — Other Ambulatory Visit: Payer: Self-pay | Admitting: Neurology

## 2010-10-28 ENCOUNTER — Ambulatory Visit (HOSPITAL_COMMUNITY)
Admission: RE | Admit: 2010-10-28 | Discharge: 2010-10-28 | Disposition: A | Discharge status: Home / Self Care | Payer: Medicare Other | Source: Ambulatory Visit | Attending: Neurology | Admitting: Neurology

## 2010-10-28 DIAGNOSIS — M545 Low back pain, unspecified: Secondary | ICD-10-CM

## 2010-10-28 DIAGNOSIS — Z981 Arthrodesis status: Secondary | ICD-10-CM | POA: Insufficient documentation

## 2010-11-16 ENCOUNTER — Encounter (INDEPENDENT_AMBULATORY_CARE_PROVIDER_SITE_OTHER): Payer: Self-pay | Admitting: General Surgery

## 2010-11-17 NOTE — Assessment & Plan Note (Signed)
NAMEMarland Kitchen  ZORI, BENBROOK              CHART#:  [id number]   DATE:                                   DOB:  Nov 04, 1955   REFERRING PHYSICIAN:  Jeoffrey Massed, MD   PROBLEM LIST:  1. Constipation, likely secondary to irritable bowel.  2. Intrahepatic and extrahepatic bowel duct dilation, secondary to      papillary stenosis.  3. Hypertension.  4. Chronic pain and fibromyalgia.  5. Anxiety.  6. Diabetes.  7. Depression.  8. Hysterectomy.  9. Cholecystectomy in 1984 due to gallstones.  10.Appendectomy.   SUBJECTIVE:  [name] is a 55 year old female who presents as a  return patient visit.  She continues to complain of her chronic  abdominal pain.  She had an upper endoscopy in October of 2007, which  showed retained food in the stomach.  She had a colonoscopy in October  of 2008, which revealed a slightly tortuous colon and was asked to start  Amitiza for constipation.  She took the Amitiza, but when it ran out,  she was told by her pharmacist that she was taking too many medicines  for her constipation.  She has one bowel movement every 3 days, with the  help of Dulcolax, one to two tablets.  She did notice some improvement  in her constipation with the Amitiza.  She takes Dulcolax every 3 days,  two in the morning and two in the evening.  She also has bloating  associated with her constipation, as well as nausea.  She continues to  take Senna, two at night time.  If she passes gas, the pressure feeling  in her mid-abdomen gets better.  The pain is not bad today.  She has had  intentional weight loss.   MEDICATIONS:  1. Neurontin.  2. Nexium 40 mg daily.  3. Senna 2 q.h.s.  4. Ambien.  5. Oxycodone 3 daily.  6. Cymbalta 60 mg b.i.d.  7. Lantus.  8. Humalog.  9. GI cocktail less than 1 time a week, as she does not like the      taste.  10.Nausea medicine every other day.  11.Dulcolax four every three days.   OBJECTIVE:  VITAL SIGNS:  Weight 167 pounds (down 12  pounds since  September of 2008), height 5 foot 4 inches, BMI 28.7 (overweight),  temperature 98.8, blood pressure 128/70, pulse 72.  GENERAL:  She is in no apparent distress, alert and oriented x4.  LUNGS:  Clear to auscultation bilaterally.  CARDIOVASCULAR:  Regular rhythm, no murmurs, normal S1 and S2.  ABDOMEN:  Bowel sounds are present, soft.  Mild tenderness to palpation  in the epigastrium without rebound or guarding, no distention.   ASSESSMENT:  [name] is a 55 year old female who presents with  chronic abdominal pain.  The etiology of her abdominal pain is  multifactorial.  When being assisted off the exam table, she complains  of increased pain in her abdomen, with suggested musculoskeletal  component to her pain.  It would not be unusual considering she has  multiple complaints of low back pain.  She may also have irritable  bowel, constipation predominant. her constipation is exacebrated by  narcotic use.  Thank you for allowing me to see [name] in  consultation.  My recommendations follow.   RECOMMENDATIONS:  1. She is asked to restart the Amitiza 8 mcg b.i.d.  I reassured her      that she is not taking too many medicines for her constipation.  2. She may continue to use the Dulcolax as needed, but hopefully with      the Amitiza on board regularly, she will decrease her use of      Dulcolax.  3. She continued to use a high fiber diet.  4. She has a return patient visit to see me in two months.       Kassie Mends, M.D.  Electronically Signed     SM/MEDQ  D:  05/26/2007  T:  05/27/2007  Job:  [id number]   cc:   Jeoffrey Massed, MD

## 2010-11-17 NOTE — Op Note (Signed)
Mallory Mendez, Mallory Mendez NO.:  [id number]   MEDICAL RECORD NO.:  [id number]          PATIENT TYPE:  AMB   LOCATION:  DAY                           FACILITY:  [facility]   PHYSICIAN:  Kassie Mends, M.D.      DATE OF BIRTH:  03/11/1956   DATE OF PROCEDURE:  04/07/2007  DATE OF DISCHARGE:  04/07/2007                               OPERATIVE REPORT   REFERRING PHYSICIAN:  Jeoffrey Massed, MD.   PROCEDURE:  Colonoscopy.   INDICATION FOR EXAM:  Ms. Harle Stanford is a 55 year old female who  presented with severe constipation.  She is average risk for developing  colon cancer. She presents for screening.   FINDINGS:  1. Slightly tortuous colon.  Otherwise normal colon without evidence      of polyps, masses, inflammatory changes, diverticula or AVMs.  2. Small internal hemorrhoids.  Otherwise normal retroflexed view of      the rectum.   RECOMMENDATIONS:  1. She should continue all measures instituted for her constipation      including Amitiza 8 mcg twice daily.  2. She has a follow-up appointment to see me in November 2008.  3. She should follow a high fiber diet.  She is given a handout on      high-fiber diet and constipation.  4. Screening colonoscopy in 10 years.   MEDICATIONS:  1. Demerol 100 mg IV.  2. Versed 4 mg IV.  3. Phenergan 25 mg IV.   PROCEDURE TECHNIQUE:  Physical exam was performed.  Informed consent was  obtained from the patient after explaining the benefits, risks and  alternatives to the procedure.  The patient was connected to the monitor  and placed in the left lateral position.  Continuous oxygen was provided  by nasal cannula and IV medicine administered through an indwelling  cannula.  After administration of sedation and rectal exam, the  patient's rectum was intubated  and the scope was advanced under direct visualization to the cecum.  The  scope was removed slowly by carefully examining the color, texture,  anatomy and integrity of the  mucosa on the way out.  The patient was  recovered in endoscopy and discharged home in satisfactory condition.      Kassie Mends, M.D.  Electronically Signed     SM/MEDQ  D:  04/07/2007  T:  04/09/2007  Job:  [id number]   cc:   Jeoffrey Massed, MD  Fax: [phone]

## 2010-11-17 NOTE — Assessment & Plan Note (Signed)
NAMEMarland Kitchen  Mallory, Mendez              CHART#:  [id number]   DATE:  03/10/2007                       DOB:  August 19, 1955   REFERRING PHYSICIAN:  Dr. Nicoletta Mendez.   PROBLEM LIST:  1. 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  2. Constipation.  3. Hypertension.  4. Chronic pain and fibromyalgia.  5. Anxiety.  6. Diabetes.  7. Depression.  8. Hysterectomy.  9. Cholecystectomy in 1984 due to gallstones.  10.Appendectomy.   SUBJECTIVE:  [name] is a 55 year old female who presents as a  return patient visit. She was last seen in June 2008 and was complaining  of a long standing history of constipation. She also thought that she  had a colonoscopy in the past. The records obtained from Dr. Juanda Chance only  included a upper endoscopy performed in 1991. She complains of having a  bowel movement only 1 to 2 times per week. She has been taking milk of  magnesia, Benefiber, Colace, and has been on a high fiber diet, as well  as consuming 6 to 8 cups of water or juice daily. She complains that she  is gaining weight, but she is not hardly eating anything. She also  complains of bloating. She occasionally has pain at nighttime in her mid-  abdomen. She denies any bleeding her rectum.   MEDICATIONS:  1. Neurontin 800 mg 3 times daily.  2. Nexium 40 mg daily.  3. Senna daily.  4. Ambien CR daily.  5. Oxycodone 3 times daily.  6. Prefest daily.  7. Cymbalta twice daily.  8. Lantus 15 units nightly.  9. Humalog.  10.GI cocktails as needed.  11.Azor.  12.Nausea medicine.  13.Dulcolax as needed.  14.Stool softener.  15.Milk of magnesia once daily.   OBJECTIVE:  VITAL SIGNS:  Weight 179 pounds (up 14 pounds since August  2007), height 5 foot 4 inches, temperature 98.3, blood pressure 140/80,  pulse 80.  GENERAL:  She is in no apparent distress. She is alert and oriented  x4.LUNGS:  Clear to auscultation bilaterally.CARDIOVASCULAR:  She has  a  regular rhythm, no murmur, normal S1, S2.ABDOMEN:  Bowel sounds are  present, soft, nontender, nondistended, no rebound or  guarding.EXTREMITIES:  Without cyanosis, clubbing, or edema. NEUROLOGIC:  She has no focal neurologic deficits.   ASSESSMENT:  [name] is a 55 year old female who has a long  standing history of constipation which is being partially treated with  her current regimen. She also complains of nausea and mid-abdominal  pain, and she has never had age-appropriate colon cancer screening.  Thank you for allowing me to see [name] in consultation. My  recommendations follow.   RECOMMENDATIONS:  1. We will schedule a colonoscopy within the next month for colon      cancer screening, as well as to investigate nausea and abdominal      pain. She will have TriLyte bowel prep.  2. She may use Phenergan, 1/2 to 1 as needed, or every 6 hours as      needed for nausea or vomiting with 3 refills.  3. Begin Amitiza 8 mcg twice daily, 2 weeks supply, refill x3.  4. She should continue the milk of magnesia, Benefiber, Colace, high      fiber diet, and adequate fluid intake.  5.  She has a follow up appointment to see me in 2 months.       Mallory Mendez, M.D.  Electronically Signed     SM/MEDQ  D:  03/10/2007  T:  03/11/2007  Job:  [id number]   cc:   Mallory Massed, MD

## 2010-11-17 NOTE — Op Note (Signed)
NAMECASSADEE, VANZANDT NO.:  [id number]   MEDICAL RECORD NO.:  [id number]          PATIENT TYPE:  AMB   LOCATION:  SDS                          FACILITY:  [facility]   PHYSICIAN:  Chalmers Guest, M.D.     DATE OF BIRTH:  1955/12/05   DATE OF PROCEDURE:  02/13/2009  DATE OF DISCHARGE:  02/13/2009                               OPERATIVE REPORT   PREOPERATIVE DIAGNOSIS:  Visually significant cataract, right eye.  The  patient requires surgery of [facility] because of anxiety  disorder, MRSA, and latex allergy.   ANESTHESIA:  Consisted of 2% Xylocaine with epinephrine 50:50 mixture of  0.75% Marcaine with an ampule of Wydase.   PROCEDURE:  The patient was taken to the operating room.  After the  appropriate monitors were fixed, the patient was given a peribulbar  block with aforementioned local anesthetic agent.  Following this, the  patient's face was prepped and draped in usual sterile fashion with the  surgeon sitting temporally and the operating microscope in position.  A  Weck-cel sponge was used to fixate the eye, 15-degree blade was used to  enter the eye at the 11 o'clock position, and a Viscoat was injected.  Following this, a 2.75 mm keratome blade was used through the temporal  clear cornea in a stepwise fashion to enter the anterior chamber.  Then,  a 25-gauge needle was used to incise the anterior capsule.  A  curvilinear capsulorrhexis was formed.  Following this using Utrata  forceps, a capsule was removed.  BSS was used to hydrodissect the  nucleus.   Following this, with the nucleus hydrodissected, the phacoemulsification  unit was then used to sculpt the nucleus.  The nucleus was sculpted into  a ball, and the epinuclear shell was then removed with the IA.  The  epinuclear shell was quite difficult to remove, but it was removed with  IA with the posterior capsule remaining intact.  Following this, Viscoat  was injected in the eye, and  intraocular lens was with an alcohol anchor  saw lens was examined and noted have no defects.   It was an SN60WF 25.5 diopter lens, SN number [id number] lens was  placed in the lens shooter.  It was injected into the capsular bag,  positioned with a Kuglen hook.  The IA was then used to remove  viscoelastic and sub incisional cortex.  A small remaining portion of  sub incisional cortex would not come off the posterior capsule.  This  was left in place rather and rupture the capsule.  Following this, BSS  was used to fill the anterior chamber.  A single 10-0 nylon suture was  placed.  There was no leakage.  Therefore, the lid speculum was removed  as well as Steri-Strips had been placed to bury away the eyelashes.  Following this, topical TobraDex was applied to the eye patch and Fox  shields were placed, and the patient returned recovery area in stable  condition.  Complications were none.      Chalmers Guest, M.D.  Electronically Signed     RW/MEDQ  D:  02/13/2009  T:  02/13/2009  Job:  [id number]

## 2010-11-17 NOTE — Assessment & Plan Note (Signed)
NAMEMarland Kitchen  Mallory Mendez              CHART#:  [id number]   DATE:  12/29/2006                       DOB:  10-05-1955   PROBLEM LIST:  1. Intra and extrahepatic biliary duct dilatation due to papillary      stenosis.  2. Constipation.  3. Hypertension.  4. Chronic pain and fibromyalgia.  5. Anxiety.  6. Diabetes.  7. Depression.  8. Hysterectomy.  9. Cholecystectomy in 1984 due to cholelithiasis.  10.Appendectomy.   SUBJECTIVE:  Mallory Mendez is a 55 year old female who presents as a  return patient visit.  She was last seen in clinic in August, 2007.  She  was last seen by me in October 2007 to have her pancreatic duct stent  removed.  She states she did fairly well for a couple of months and then  she began to have stomach swelling and could not go to the bathroom.  She initially was using Dulcolax alone to have bowel movements.  She  would take Dulcolax 2 at nighttime and have a bowel movement the next  day.  Then she began to add MiraLax twice a day.  She took the MiraLax  with orange juice because she did not like the taste.  With the MiraLax,  she has a lot of gas and liquidy stool.  The combination of MiraLax and  Dulcolax causes her to have a bowel movement every other day.  With  Dulcolax previously, she would have a bowel movement every other day.  She has only had a few changes in her medications.  She is now off  Cardizem as well as Altace.  Since October 2007, she has gained 10 lb.  For her nausea, Dr. Milinda Cave has added a medicine.  She is not sure of  the name.  She reports having a colonoscopy 3 years ago in [place].  She says she has never ever had her thyroid checked.  She is not having  any rectal bleeding.  She reports drinking 4 glasses of water a day.  She has had no change in her diet.   MEDICATIONS:  1. Neurontin 800 mg 4 times a day for back pain and fibromyalgia.  2. Nexium 40 mg daily.  3. Senna 2 at bedtime.  4. Ambien 12.5 mg q. h.s.  5.  Oxycodone 10 mg 3 times a day.  6. Prefest daily.  7. Cymbalta 60 mg b.i.d.  8. Lantus 11 units at nighttime.  9. Humalog sliding scale 3 times a day.  10.GI cocktail 3 times a day.  11.Azor daily.  12.Nausea medicine twice daily.   OBJECTIVE:  VITAL SIGNS:  Weight 165.5 lb, height 5 feet 4 inches, BMI  28.3 (overweight), temperature 98.8, blood pressure 110/70, pulse 72.  GENERAL:  In no apparent distress.  Alert and oriented x4.  LUNGS:  Clear to auscultation bilaterally. CARDIOVASCULAR:  Regular rhythm, no  murmur.  Normal S1 and S2.ABDOMEN:  Bowel sounds present.  Soft,  nontender, nondistended.  No rebound or guarding, obese.EXTREMITIES:  Without clubbing, cyanosis or edema.  NEUROLOGIC:  She has no focal neurologic deficits.   ASSESSMENT:  Mallory Mendez is a 55 year old female who has a  longstanding history of constipation.  Her symptoms do not appear to be  significantly different now than before her endoscopic retrograde  cholangio-pancreatography and sphincterotomy.  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  The differential diagnosis includes  electrolyte disturbance, which may be contributing to her constipation  or thyroid disease.   Thank you for allowing me to see Mallory Mendez in consultation.  My  recommendations follow.   RECOMMENDATIONS:  1. Mallory Mendez is given her instructions in writing.  She is asked      to drink 6-8 cups of water or juice per day.  2. She is asked to follow a high fiber diet.  She is to try different      items and remember that some items may increase bloating.  If so,      she is asked to avoid them.  3. She is asked to add Benefiber once a day to achieve a bowel      movement every 2-3 days.  4. She is asked to discontinue the use of MiraLax, which can cause      bloating.  It may also cause gas.  She is asked to add Milk of      Magnesia once twice daily to achieve a bowel movement once every 2-      3 days.  5. She may  use Colace (docusate sodium) 100 mg capsules once twice      daily as needed for her constipation.  6. She has a followup appointment to see me in 2 months.  7. I will obtain the colonoscopy report from  GI to confirm      that she actually had a colonoscopy approximately 3 years ago.  8. She may use Dulcolax if needed if she has no bowel movement after 3      days.       Mallory Mendez, M.D.  Electronically Signed     SM/MEDQ  D:  12/29/2006  T:  12/29/2006  Job:  [id number]   cc:   Mallory Massed, MD

## 2010-11-20 NOTE — H&P (Signed)
NAME:  Mallory Mendez, Mallory Mendez                       ACCOUNT NO.:  [id number]   MEDICAL RECORD NO.:  [id number]                   PATIENT TYPE:  INP   LOCATION:  0449                                 [facility]ACILITY:  [facility]   PHYSICIAN:  Kerrin Champagne, M.D.                DATE OF BIRTH:  [birth year]   DATE OF ADMISSION:  DATE OF DISCHARGE:  05/22/2002                                HISTORY & PHYSICAL   CHIEF COMPLAINT:  Low back and radicular pain to the left lower extremity.   HISTORY OF PRESENT ILLNESS:  The patient is a 55 year old white female who  has had multiple lumbar surgeries previously.  She has undergone a lumbar  fusion L5 to sacrum with pedicle screws and posterolateral fusion  approximately seven to eight years prior to this admission.  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  She was taken back to the operating room and underwent removal of  the plate and screw as well as an anterior lumbar diskectomy and fusion.  Postoperatively she continued with persistent pain radiating into the left  lower extremity and eventually underwent extension of her fusion to the L4-5  level.  This was done by a surgeon in [place], [place] and  unfortunately the patient developed postoperative methicillin resistant  Staphylococcus aureus infection.  She was treated with prolonged antibiotics  and this eventually resolved.  At this time she has persistence of the left  lower extremity discomfort.  She has noted weakness in her left foot.  She  has intermittent symptoms of numbness and tingling.  She is unable to sit  for any period of time without severe discomfort.  She has failed  conservative treatment including antiinflammatory medications, weight loss,  physical therapy, and now requires narcotic analgesics routinely for pain  control.  She is also being seen at the pain management clinic.  An MRI has  shown findings  suggesting left L5-S1 foraminal stenosis.  Due to her  continued symptoms it was felt she would require surgical intervention and  is being admitted at this time to undergo a left L5-S1 and left L4-5  foraminotomy with left L5 hemilaminectomy.  This is being done in hopes of  decompressing the left L5-S1 and L4 nerve roots.  The patient also has  carpal tunnel syndrome of both upper extremities and this has been treated  with splinting and antiinflammatory medications over the last year.  Her  symptoms are progressing and much worse at this time.  She has undergone  previous right carpal tunnel release and now wishes to proceed with left  carpal tunnel release.  She will also undergo the left open left carpal  tunnel release during this operative procedure.   CURRENT MEDICATIONS:  1. OxyContin 20 mg 1 p.o. t.i.d.  2. Lorcet 10 mg 1 p.o. t.i.d.  3. Plavix which she  has not been taking since 05/03/02.  4. Altace questionable dose 1 p.o. b.i.d.  5. Cardizem 360 mg 1 p.o. daily.  6. Xanax questionable dose 1 daily.  7. Neurontin 300 mg 1 p.o. q.h.s.  8. Effexor 75 mg 1 p.o. daily.  9. Ambien 10 mg 1 p.o. q.h.s. p.r.n. sleep.  10.      Lantus insulin 10 units q.a.m. and 5 units after supper.   ALLERGIES:  The patient reports she is allergic to the powder in latex  gloves which causes swelling, hives, and trouble breathing.   PAST MEDICAL HISTORY:  1. Insulin-dependent diabetes mellitus.  2. Hypertension.  3. History of vertigo evaluated with an MRI of the brain on 05/03/02 showing     small vessel ischemic changes.  4. Frequent urinary tract infections.   PAST SURGICAL HISTORY:  1. Multiple lumbar surgeries as stated in history of present illness.  2. Abdominal hernia repair x2.  3. Tubal ligation.   SOCIAL HISTORY:  The patient lives with her family.  She is divorced.  She  smokes approximately 3/4 packs of cigarettes per day.  She has no intake of  alcohol.  Her primary care  physician is Dr. Sherwood Gambler in [place], [place].   FAMILY HISTORY:  Noncontributory to present illness.   REVIEW OF SYSTEMS:  CENTRAL NERVOUS SYSTEM: The patient denies blurred  vision, double vision, or seizure disorder.  She does continue to have  dizziness occasionally.  She has occasional headaches which are treated with  over-the-counter medications as well.  CARDIORESPIRATORY: No chest pain,  shortness of breath, cough, sputum production, or hemoptysis.  GENITOURINARY/GASTROINTESTINAL: No nausea and vomiting, diarrhea, or  constipation.  No dysuria, hematuria, melena, or bloody stools.  MUSCULOSKELETAL: As per history of present illness.  HEMATOLOGIC: The  patient denies history of blood clots, bleeding disorders.  She has had a  blood transfusion in the past in 1991 following a lumbar fusion.   PHYSICAL EXAMINATION:  VITAL SIGNS: Temperature 99.9, pulse 76 and regular,  blood pressure 130/80.  GENERAL: She is a well-developed well-nourished white female, alert and  oriented x4, in no acute distress.  HEENT: Normocephalic, atraumatic.  Pupils equal, round, and reactive to  light and accommodation.  Extraocular movements intact.  NECK: Supple.  No adenopathy or thyromegaly.  LUNGS: Clear to auscultation.  HEART: With regular rate and rhythm.  No murmur heard.  ABDOMEN: Soft and nontender.  Bowel sounds present.  GENITALIA/RECTAL/BREASTS: Not performed.  Not pertinent to present illness.  EXTREMITIES: Distal and bilateral lower extremities the patient has  sensation intact.  Distal pulses are +2 at dorsalis pedis bilaterally.  There is clubbing, cyanosis, or edema.  Deep tendon reflexes at the knee are  +2 and symmetric.  At the ankle 0 on the left and +2 on the right.  Dangling  straight leg raise is slightly positive on the left.  Popliteal compression  sign is slightly positive on the left as well.  There is no clubbing, cyanosis, or edema of the lower extremities.   Examination of the left hand  reveals decreased sensation in the median nerve distribution.  She has a  positive Phalen's sign at 5-10 seconds.  Positive Tinel's sign on the left.   IMPRESSION:  1. 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  2. Carpal tunnel syndrome left.  3. Insulin-dependent diabetes mellitus.  4. Hypertension.  5. Tobacco abuse.  6. Previous postoperative infection following lumbar surgery with  methicillin resistant Staphylococcus aurous requiring prolonged     antibiotic treatment.  7. History of vertigo with magnetic resonance imaging showing small vessel     ischemic changes.    PLAN:  The patient is being admitted to the hospital to undergo left L5 S1  and L4-5 foraminotomy with left L5 hemilaminectomy and decompression of the  left L5 S1 and L4 nerve roots.  She will also undergo an open left carpal  tunnel release.  She will require treatment preoperatively and  postoperatively with vancomycin to prevent increase risk of MSIR infection.     Wende Neighbors, P.A.                    Kerrin Champagne, M.D.    SMV/MEDQ  D:  07/27/2002  T:  07/27/2002  Job:  [id number]

## 2010-11-20 NOTE — Op Note (Signed)
NAMEKATHYRN, WARMUTH NO.:  [id number]   MEDICAL RECORD NO.:  [id number]          PATIENT TYPE:  INP   LOCATION:  A416                          FACILITY:  [facility]   PHYSICIAN:  Tilda Burrow, M.D. DATE OF BIRTH:  [birth year]   DATE OF PROCEDURE:  12/30/2004  DATE OF DISCHARGE:                                 OPERATIVE REPORT   POSTOPERATIVE DIAGNOSIS:  First degree uterine descensus, cystocele,  rectocele, anal polyp.   POSTOPERATIVE DIAGNOSIS:  First degree uterine descensus, cystocele,  rectocele, anal polyp.   PROCEDURE:  Vaginal hysterectomy, left salpingo-oophorectomy, anterior and  posterior repair, excision of anal polyp.   SURGEON:  Dr. Emelda Fear.   ASSISTANT:  Morrie Sheldon, R.N.   ANESTHESIA:  General.   COMPLICATIONS:  None.   ESTIMATED BLOOD LOSS:  400 cc.   FINDINGS:  Technical difficulty accessing right tube and ovary warranting  leaving the ovary in situ.   DETAILS OF PROCEDURE:  The patient was taken to the operating room and  prepped and draped for vaginal procedure. Legs were placed in their  operative position while the patient was awake to ensure no back discomfort  was worsened by the positioning. After general anesthesia was introduced,  the peritoneum was prepped and draped. The patient had stool soilage at  first surgical preparation, so we had to remove the drapes, evacuate the  rectum, and then prep again. We then proceeded with vaginal bib in place and  proceeded with hysterectomy by making a circumferential incision of the skin  which had been infiltrated with Xylocaine with epinephrine, prepped, then  performing posterior colpotomy incision. Uterosacral ligaments were clapped,  cut, and suture ligated using Zeppelin clamps, with the pedicles tagged for  future identification. The lower cardinal ligaments were clamped, cut, and  suture ligated with Zeppelin clamp, Mayo scissors, and 0 chromic suture  ligature. The anterior bladder  elevation had been easily performed, and then  we able to enter the peritoneum anteriorly. We then marched the upper  cardinal ligaments with serial bites using the same clamp, cut and tie  technique. We marched up to the level of the utero-ovarian ligaments on  either side without difficulty. Broad ligament was taken down to the level  of the round ligaments. We were then able to cross clamp the right adnexal  pedicle including the round ligament, doubly ligating this pedicle. Uterus  was then excised, and the left tube and ovary taken out with the surgical  specimen. In other words, we on the left side were able to get up high  enough to cross clamp the infundibular pelvic ligament instead of the utero-  ovarian ligament. Pedicles were doubly ligated at their apex, inspected for  hemostasis, and released. Reinspection of the right adnexa showed that the  ovary did not retract inferior sufficiently for easy access, so we left it  alone since it appeared grossly normal.   A 0 silk suture was placed in the utero-sacral ligaments on either side,  pulled and secured in the midline above the cuff, and the peritoneum was  closed in a transverse continuous running  fashion using 2-0 chromic. Sponge,  needle and instrument counts were correct on the pelvic closure. The cuff  was closed side to side with interrupted 2-0 chromic. The cystocele was then  addressed by splitting the skin over the cystocele, dissecting laterally,  obtaining good paravaginal support tissues, trimming approximately 1.5-cm  wide strip of tissue from the anterior skin over the cystocele and then  reapproximating with horizontal mattress sutures of 0 Dexon and then 2-0  chromic closure of the skin edges. Good elevation of the bladder resulted.   Posterior repair was then performed by splitting the perineal body,  dissecting the tissues off of the underlying rectal tissues. Digital rectal  exam was performed, confirming no  penetration of the thin rectal mucosa in  the area and then transverse horizontal mattress sutures placed to rebuild  the perineal body over the rectocele weakness. This resulted in a strong  improved perineal body, and then skin edges were pulled together with 2-0  chromic after trimming redundant posterior vaginal tissues.   Anal polyp was easily identified and placed on traction and excised at its  base with minimal oozing. The patient tolerated the procedure well and went  to recovery room in good condition, sponge and needle counts being correct.  EBL 400 cc.       JVF/MEDQ  D:  12/30/2004  T:  12/30/2004  Job:  [id number]   cc:   Francoise Schaumann. Halford Chessman  Fax: [phone]

## 2010-11-20 NOTE — Discharge Summary (Signed)
Mallory Mendez, BALENTINE NO.:  [id number]   MEDICAL RECORD NO.:  [id number]          PATIENT TYPE:  INP   LOCATION:  A416                          FACILITY:  [facility]   PHYSICIAN:  Tilda Burrow, M.D. 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   DISCHARGE DIAGNOSIS:  Stable.  Insulin-dependent diabetes mellitus (type 2).  Diffuse anxiety disorder, hyperlipidemia, hypertension, chronic low back  pain with disability.   PROCEDURES:  Vaginal hysterectomy with left salpingo-oophorectomy, anterior  and posterior repair by J.V. Emelda Fear.   DISCHARGE MEDICATIONS:  1.  Altace 10 mg p.o. daily.  2. Alprazolam 1 mg q.8h. p.r.n.  3. Ambien 10      mg p.o. q.h.s.  4. Cymbalta 60 mg p.o. daily.  5. Diltiazem 360 mg p.o.      daily.  6. Lipitor 20 mg p.o. daily.  7. Neurontin 300 mg t.i.d.  8.      Nexium 40 mg p.o. daily.  9. OxyContin 20 mg p.o. t.i.d.   ADDITIONAL MEDICINES THIS PREGNANCY:  1.  Nicoderm 21 mg patch to the skin daily.  2. Tylox 40 tablets 1 q.6h.      p.r.n. pain.  3. Doxycycline 100 mg b.i.d. x1 week.   Followup in one week with [facility].   HOSPITAL SUMMARY:  This 55 year old female on disability due to chronic back  pain with vaginal hysterectomy scheduled for cystocele/rectocele and uterine  descensus with also excision of an anal polyp required.  The patient was  admitted 6/26 and underwent surgery without difficulty.  We were unable to  remove the right tube and ovary, but the rest of the surgery was straight  forward.  Postoperative hemoglobin was 13, unchanged from preoperative.  It  reduced hemoglobin to 11.8 on postoperative day #2.  This was on a 460  estimated blood loss.  She had  normal electrolytes.  Blood sugars were  monitored during the pregnancy and remained less than 200.  She will, thus,  go home on her previous diabetic regimen of Lantus 20 mg subcutaneously  q.h.s.   The patient remained afebrile and received Levaquin intraoperative  prophylaxis and was given one day of vancomycin as an additional  prophylaxis.  She will discharge on doxycycline.  She remained afebrile with  normal white count and followup will be in one week in our office for  reassessment and continuation of care.   Pathology report pending at the time of discharge.       JVF/MEDQ  D:  12/30/2004  T:  12/30/2004  Job:  [id number]   cc:   Jeoffrey Massed, MD  [address]  [place]  [place] [address]  Fax: [phone]   [facility]   Ewing Schlein  [address]  [place]  [place] [address]  Fax: [phone]

## 2010-11-20 NOTE — Op Note (Signed)
NAMEKAMBRE, MESSNER NO.:  [id number]   MEDICAL RECORD NO.:  [id number]          PATIENT TYPE:  AMB   LOCATION:  DAY                           FACILITY:  [facility]   PHYSICIAN:  Kassie Mends, M.D.      DATE OF BIRTH:  28-Sep-1955   DATE OF PROCEDURE:  04/13/2006  DATE OF DISCHARGE:                                 OPERATIVE REPORT   PROCEDURE:  Esophagogastroduodenoscopy with pancreatic duct stent removal  via rat-tooth forceps.   INDICATION FOR EXAM:  Ms. Harle Stanford is a 55 year old female who is status  post complete biliary sphincterotomy on February 23, 2006.  She had a 5-French  3 cm Viaduct pancreatic stent placed to prevent post ERCP pancreatitis.  She  presents for stent removal because KUB confirmed the stent was not  spontaneously expelled.   FINDINGS:  1. Pancreatic duct stent seen in the lumen of the duodenum.  The      pancreatic duct stent was grasped with a rat-tooth forceps and removed.  2. Moderate amount of retained food in the stomach.  A limited view of the      gastric mucosa was obtained.  3. Normal esophagus without evidence of Barrett's esophagus.  4. Normal duodenum.   RECOMMENDATIONS:  1. Return patient visit in 3 weeks.  Will consider gastric emptying study      to evaluate for delayed gastric emptying.  Ms. Nicholson's gastric      emptying is likely secondary to high dose narcotics and her diabetes.  2. Begin step 3 gastroparesis diet.   MEDICATIONS:  1. Demerol 75 mg IV.  2. Versed 4 mg IV.   PROCEDURE TECHNIQUE:  Physical exam was performed and informed consent was  obtained from the patient after explaining all the benefits, risks and  alternatives to the procedure.  The patient was connected to the monitor and  placed in left lateral position.  Continuous oxygen was provided by nasal  cannula and IV medicine administered through an indwelling cannula.  After  administration of sedation and rectal exam, the scope was advanced  under  direct visualization to the second portion of the duodenum.  The scope was  subsequently removed slowly by carefully examining the color, texture,  anatomy and integrity of the mucosa on the way out.  The patient was  recovered in the endoscopy suite and discharged to home in satisfactory  condition.      Kassie Mends, M.D.  Electronically Signed     SM/MEDQ  D:  04/13/2006  T:  04/15/2006  Job:  [id number]   cc:   Holley Bouche, M.D.  Fax: [phone]

## 2010-11-20 NOTE — H&P (Signed)
NAMEALICEA, Mendez NO.:  [id number]   MEDICAL RECORD NO.:  [id number]          PATIENT TYPE:  AMB   LOCATION:  DAY                           FACILITY:  [facility]   PHYSICIAN:  Tilda Burrow, M.D. DATE OF BIRTH:  09-Jul-1955   DATE OF ADMISSION:  DATE OF DISCHARGE:  LH                                HISTORY & PHYSICAL   ADMISSION DIAGNOSES:  1.  First-degree uterine descensus.  2.  Cystocele.  3.  Rectocele.  4.  Left lower quadrant pain.  5.  History of methicillin resistant Staphylococcus aureus since 2003.   HISTORY OF PRESENT ILLNESS:  This 55 year old female is admitted at this  time for vaginal hysterectomy, anterior and posterior repair with possible  removal of both tubes and ovaries if they are acceptable.  Mallory Mendez has been  followed throughout this for over 12 years, including deliveries.  She has  been evaluated in 2003 with pelvic discomfort complaints.  She had mild  urinary loss with coughing and sneezing as far back as April 2004.  At that  time her weight was greater than it is now.  She has lost 30 pounds over the  last three years.  Her stress incontinence has improved.  She has been  urodynamic testing recently by Dr. Despina Hidden which identified that she has some  mild detrusor instability.  She did not have stress incontinence by  urodynamic testing at this point.  Her weight is now 160 pounds.  There are  no plans for any slings to be placed at the time of the surgery.   PAST SURGICAL HISTORY:  The patient was found to have MRSA in _[facility]_ .  Then she had a back surgery in ___2001_ in [facility].  She did very well with repeat back surgery in 2003 while on vancomycin  prophylaxis.   GYN HISTORY:  Gravida 5, para 5, status post laparoscopic tubal  sterilization and tubal cautery.  She has GYN complaints of dyspareunia  noted more on the left than on the right.  She complains of chronic  constipation and indeed there is a lax  perineal body and a rectocele  present.  There is a mild cystocele not to the introitus and uterus descends  to within 2-3 cm of the introitus.  She has had pelvic ultrasound in 2003  which showed uterus to be 9.8 x 4.2 x 6.2 cm.  Adnexal structures are  negative.  GC and Chlamydia are negative.  Most recent Pap smear on Nov 11, 2004 was Class I.   PAST MEDICAL HISTORY:  1.  Previously mentioned MRSA carrier status.  2.  Initially she has chronic back pain after a back injury.  She is      considered disabled by this.  She has had two surgeries as mentioned      earlier on her back.  The second of the surgeries was a combined      anterior and posterior approach which resulted in a midline abdominal      incision.  3.  She has surgical history notable for  abnormal hernia repair performed by      Pfannenstiel incision by Dr. Leona Carry at which time a mesh was put in      place.  4.  Physical examination in 2004 by Dr. Rito Ehrlich also revealed mild      cystocele.   Mallory Mendez is admitted for surgical procedure.  She is aware of the risks of  adhesions and problems that might result in the need for an open incision.  She is aware that surgical procedure may worsen her current symptoms and  indeed the plan is to place her on Vesicare or Ditropan as the prophylaxis  in the postoperative time frame.   ALLERGIES:  No known medical allergies.  There is a questionable LATEX  allergy.   PHYSICAL EXAMINATION:  GENERAL:  She is a healthy-appearing Caucasian  female. Weight 150, blood pressure 138/80. Urinalysis negative.  The patient  is not having periods, considered postmenopausal.  HEENT:  Pupils are equal, round and reactive.  NECK:  Supple.  CHEST:  Clear to auscultation.  BREASTS:.  Normal at last check.  ABDOMEN:  Well-healed midline scar from her orthopedic surgery with her  anterior approach to back surgery.  Lower abdominal Pfannenstiel incision  well-healed.  External genitalia with  multiparous.  Lax perineal body  present. Rectocele present.  Mild cystocele present.  Uterus within 2-3 cm  of the introitus.  Uterus is upper limits of normal size.  Adnexa is  nontender at this time.  History of left lower quadrant dyspareunia.  EXTREMITIES:  Grossly normal.   MEDICATIONS:  Xenical, Xanax, Neurontin 300 mg p.o. daily, Cardizem, Altace,  Lipitor with Lorcet taken for pain on a p.r.n. basis.   IMPRESSION:  1.  Cystocele, rectocele, first-degree uterine descensus.  2.  MRSA carrier status.   PLAN:  Admit in the a.m. for vaginal hysterectomy. Prophylaxis will be  required for MRSA.  Originally  planned to use Levaquin but in reviewing the  chart, the patient did well on vancomycin, so this change will be confirmed  in the a.m.  The risks of the procedure including the possibility that the  ovaries cannot be easily removed or that bowel adhesions might interfere  have been reviewed with the patient.   Plan is vaginal hysteroscopy, anterior and posterior repair.  Possible  removal of tubes and ovaries on December 28, 2004 at 7:30 a.m.       JVF/MEDQ  D:  12/27/2004  T:  12/28/2004  Job:  [id number]   cc:   [facility]  Fax: [phone]

## 2010-11-20 NOTE — Op Note (Signed)
NAME:  Mallory Mendez, Mallory Mendez                       ACCOUNT NO.:  [id number]   MEDICAL RECORD NO.:  [id number]                   PATIENT TYPE:  AMB   LOCATION:  DAY                                  FACILITY:  [facility]   PHYSICIAN:  Kerrin Champagne, M.D.                DATE OF BIRTH:  Dec 02, 1955   DATE OF PROCEDURE:  05/18/2002  DATE OF DISCHARGE:                                 OPERATIVE REPORT   PREOPERATIVE DIAGNOSES:  1. 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  2. Left carpal tunnel syndrome.   POSTOPERATIVE DIAGNOSES:  1. 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  2. Left carpal tunnel syndrome.   PROCEDURE:  1. Left L5-S1 and left L4-5 foraminotomy with left L5 hemilaminectomy.  2. Decompression of the left L5, S1, and L4 nerve roots.  3. Left carpal tunnel release, open.   SURGEON:  Kerrin Champagne, M.D.   ASSISTANT:  Georges Lynch. Darrelyn Hillock, M.D.   ANESTHESIA:  Hezzie Bump. Okey Dupre, M.D., GOT.   ESTIMATED BLOOD LOSS:  150 cc.   DRAINS:  Hemovac x 1 left lower lumbar.   TOTAL TOURNIQUET TIME:  250 mmHg, 7 minutes left arm.   BRIEF CLINICAL HISTORY:  The patient is a 55 year old female, who has  undergone three previous lumbar surgeries including a lumbar effusion L5 to  sacrum with pedicle screws and posterolateral fusion.  This done nearly 7-8  years ago.  The patient developed progressive nonunion, fracture of a screw  at the sacral level, was taken back to the operating room, underwent removal  of the plates and screws as well as anterior lumbar diskectomy and fusion.  Postoperatively continued to persist with pain radiating to the left lower  extremity, eventually seen by a surgeon in [place] and underwent an  extension of her fusion to the L4-5 level and developed a postoperative MRSA  infection.  This eventually resolved; however, she persisted with left lower  extremity complaints.  She had been in pain management for several years and  OxyContin chronically.   She was referred last year with findings on MRI  suggesting a left L5-S1 foraminal stenosis.  Repeat MRI scan was not as  impressive, and a CT scan also did not appear to demonstrate foraminal  narrowing with CT myelogram.  EMG's, nerve conduction studies were  unremarkable.   With this in mind, the patient has been treated conservatively.  She does  have problems with carpal tunnel syndrome, both upper extremities, and had  been treated with splinting and anti-inflammatory agents, weight loss over  the last year, but the symptoms have progressively worsened.  She has  undergone previous right carpal tunnel release and returns for left carpal  tunnel releases as well as her lumbar procedure today.   INTRAOPERATIVE FINDINGS:  Severe left-sided L5 nerve root entrapment  secondary to hypertrophic changes overlying the joint within the neural  foramen.  This is bone associated  with both fusion as well as with areas of  spondylosis and entrapment of the nerve root here.  There was an exit  lateral recess stenosis present as well due to hypertrophic ligamentum  flavum and pressing upon the thecal sac on the left side.  This would tend  to impress upon both the L5 and S1 nerve roots.  L4-5 did not show as much  foraminal stenosis as seen at the L5-S1 level affecting the L5 root.  Left  carpal tunnel demonstrated severe carpal tunnel stenosis with hourglass  deformity, loss of normal blood flow through the area of the area of tight  stenosis.  Following relief of the arteries, refilled without difficulty.   DESCRIPTION OF PROCEDURE:  After adequate general anesthesia, the patient in  knee-chest position, Andrews frame, standard preoperative antibiotics with  vancomycin for a history of MRSA.  Prepped with DuraPrep solution, draped in  the usual manner.  Iodine Vi-Drape was used.  The old incision scar was  ellipsed using a 10 blade scalpel.  Incision carried sharply down the  lumbodorsal  fascia.  This is incised along the left side, elevated off the  left sacrum, and then carried out to the left lateral bony masses, the L5-  S1, L4-5 facets posterolaterally.  McCullough retractor is inserted.  A  Penfield four was then used to carefully dissect the scar tissue off the  lateral aspect of the spinal canal of the expected L5-S1 level, and the  [place] four then placed within the spinal canal and held in place with  sponges; lateral radiograph obtained in the operating room demonstrating the  [place] four at the upper end of the S1 level.   Leksell rongeur then used to debride soft tissue overlying the posterior  aspect of the lamina at L4, L5, S1.  High-speed bur then used to carefully  extend the posterior elements along the left side, performing partial  facetectomy at L5-S1 facet as well as L4-5 facet.  A 3 mm Kerrison was able  to be introduced and used to debride the lateral aspect of the spinal canal  at L5-S1 level, removing the medial facet on this segment.  The lateral  aspect of the spinous process of L5 required debridement in order to be able  to visualize more readily the medial aspect of the neural canal at the L5-S1  level.  Decompression was then carried out, removing the left side of the  lamina at the L5 using 3 mm and 4 mm Kerrisons, debriding hypertrophic  ligamentum flavum overlying the left L5 nerve root, decompressing the L5  nerve root out through the neural foramen and beyond the neural foramen.  Lateral recess was decompressed on the left side so that hockey-stick nerve  probe could be passed underneath the L4-5 facet, and the L4-5 facet was  similarly debrided back to the level of the pedicle laterally.  Hockey-stick  nerve probe could then be passed out the neural foramen for the L4 nerve  root and the L5 nerve root and S1 nerve roots.  The left side of the thecal sac at the area of the origin of the L5 nerve root showed deep indentation   secondary to the patient's lamina impressing upon this area as well as  hypertrophic flavum, and a hemilaminotomy on the left side was performed to  decompress this side of the thecal sac.  There appeared to be solid fusion  at the L5-S1 level.  No motion evident at L4-5.  Irrigation was performed,  bone wax applied to bleeding cancellous bone surfaces.  No Gelfoam was left  present within the wound.  Medium Hemovac drain placed in the depth of the  incision, exiting over the left lower lumbar spine.  Lumbodorsal fascia  reapproximated in the midline with interrupted #1 Vicryl sutures, deep subcu  layers with interrupted #1 and 0 Vicryl sutures, more superficial areas with  interrupted 2-0 Vicryl sutures, and the skin closed with a running subcu  stitch of 4-0 suture.  This was performed as a running subcu stitch, ends  left open for later removal.  Tincture of Benzoin and Steri-Strips applied,  4 x 4's, ABD pad affixed to the skin with Hypafix tape.  The drain further  adhesed to the dressing using pink tape.  The patient then returned to a  supine position.  Left upper extremity then prepped using DuraPrep solution  from fingertips to the upper arm, tourniquet about the left upper arm,  draped in the usual manner.  Left arm was elevated, exsanguinated with  Esmarch bandage, tourniquet inflated to 250 mmHg.  An incision approximately  3.5 cm in length was then made along the left thenar eminence occurring  across the distal wrist folds.  Incision carried sharply through the skin  and subcu layers down to the area of the transverse carpal ligament.  Also  along the superficial fascia of the distal forearm.   The patient then had incision of the distal forearm fascia and a sharp  scissors then used to divide the fascia distally to the region of the  transverse carpal ligament.  A freer elevator then passed beneath the  transverse carpal ligament.  This was then divided sharply using a 15  blade  scalpel.  Sharp scissors then used to divide the fascial layers in the  palmar fascia distally until the transverse carpal vascular leash was noted  to be present.  There was no further compression of the nerve at this point.  Examination of the nerve demonstrated severe pressure in the region of the  transverse carpal ligament to be evident.  A small amount of epineural lysis  was carried out along the distal forearm.  Distal forearm fascia was also  divided proximally beneath the skin flap, elevating it using Senn retractor.  Thus, this was done until the entire carpal canal was felt to be completely  decompressed.  Tourniquet was then released.  Bleeders controlled using  bipolar electrocautery.  Skin then reapproximated with interrupted 4-0 nylon  sutures in both horizontal and vertical mattress sutures.  This being  completed, then the skin was then infiltrated with Marcaine 0.5% plain. Adaptic and 4 x 4's affixed to the skin with a sterile Webril.  A well-  padded volar splint then applied to the left wrist with the wrist in  approximately 20 degrees of dorsiflexion.  The patient was then reactivated  and returned to the recovery room in satisfactory condition.  All instrument  and sponge counts were correct.                                               Kerrin Champagne, M.D.    JEN/MEDQ  D:  05/18/2002  T:  05/18/2002  Job:  [id number]

## 2010-12-04 ENCOUNTER — Other Ambulatory Visit (HOSPITAL_COMMUNITY): Payer: Self-pay | Admitting: Interventional Radiology

## 2010-12-04 DIAGNOSIS — I729 Aneurysm of unspecified site: Secondary | ICD-10-CM

## 2010-12-08 ENCOUNTER — Other Ambulatory Visit (HOSPITAL_COMMUNITY): Payer: Self-pay | Admitting: Interventional Radiology

## 2010-12-08 ENCOUNTER — Ambulatory Visit (HOSPITAL_COMMUNITY)
Admission: RE | Admit: 2010-12-08 | Discharge: 2010-12-08 | Disposition: A | Discharge status: Home / Self Care | Payer: Medicare Other | Source: Ambulatory Visit | Attending: Interventional Radiology | Admitting: Interventional Radiology

## 2010-12-08 DIAGNOSIS — F411 Generalized anxiety disorder: Secondary | ICD-10-CM | POA: Insufficient documentation

## 2010-12-08 DIAGNOSIS — I729 Aneurysm of unspecified site: Secondary | ICD-10-CM

## 2010-12-08 DIAGNOSIS — I720 Aneurysm of carotid artery: Secondary | ICD-10-CM | POA: Insufficient documentation

## 2010-12-08 DIAGNOSIS — I10 Essential (primary) hypertension: Secondary | ICD-10-CM | POA: Insufficient documentation

## 2010-12-08 DIAGNOSIS — E119 Type 2 diabetes mellitus without complications: Secondary | ICD-10-CM | POA: Insufficient documentation

## 2010-12-08 DIAGNOSIS — R42 Dizziness and giddiness: Secondary | ICD-10-CM | POA: Insufficient documentation

## 2010-12-08 DIAGNOSIS — F17200 Nicotine dependence, unspecified, uncomplicated: Secondary | ICD-10-CM | POA: Insufficient documentation

## 2010-12-08 DIAGNOSIS — IMO0001 Reserved for inherently not codable concepts without codable children: Secondary | ICD-10-CM | POA: Insufficient documentation

## 2010-12-08 DIAGNOSIS — F329 Major depressive disorder, single episode, unspecified: Secondary | ICD-10-CM | POA: Insufficient documentation

## 2010-12-08 DIAGNOSIS — F3289 Other specified depressive episodes: Secondary | ICD-10-CM | POA: Insufficient documentation

## 2010-12-08 DIAGNOSIS — R51 Headache: Secondary | ICD-10-CM | POA: Insufficient documentation

## 2010-12-08 LAB — CBC
MCV: 82.7 fL (ref 78.0–100.0)
Platelets: 161 10*3/uL (ref 150–400)
RBC: 4.51 MIL/uL (ref 3.87–5.11)
WBC: 8.1 10*3/uL (ref 4.0–10.5)

## 2010-12-08 LAB — POCT I-STAT, CHEM 8
Calcium, Ion: 1.05 mmol/L — ABNORMAL LOW (ref 1.12–1.32)
Chloride: 106 mEq/L (ref 96–112)
HCT: 37 % (ref 36.0–46.0)
Hemoglobin: 12.6 g/dL (ref 12.0–15.0)
TCO2: 25 mmol/L (ref 0–100)

## 2010-12-08 LAB — APTT: aPTT: 29 seconds (ref 24–37)

## 2010-12-08 LAB — GLUCOSE, CAPILLARY: Glucose-Capillary: 116 mg/dL — ABNORMAL HIGH (ref 70–99)

## 2010-12-08 MED ORDER — IOHEXOL 300 MG/ML  SOLN
150.0000 mL | Freq: Once | INTRAMUSCULAR | Status: AC | PRN
  Administered 2010-12-08: 75 mL via INTRAVENOUS

## 2010-12-18 ENCOUNTER — Encounter: Payer: Medicare Other | Attending: Physical Medicine & Rehabilitation | Admitting: Physical Medicine & Rehabilitation

## 2010-12-18 DIAGNOSIS — M542 Cervicalgia: Secondary | ICD-10-CM | POA: Insufficient documentation

## 2010-12-18 DIAGNOSIS — X58XXXS Exposure to other specified factors, sequela: Secondary | ICD-10-CM | POA: Insufficient documentation

## 2010-12-18 DIAGNOSIS — R42 Dizziness and giddiness: Secondary | ICD-10-CM | POA: Insufficient documentation

## 2010-12-18 DIAGNOSIS — F341 Dysthymic disorder: Secondary | ICD-10-CM | POA: Insufficient documentation

## 2010-12-18 DIAGNOSIS — F0781 Postconcussional syndrome: Secondary | ICD-10-CM | POA: Insufficient documentation

## 2010-12-18 DIAGNOSIS — Z794 Long term (current) use of insulin: Secondary | ICD-10-CM | POA: Insufficient documentation

## 2010-12-18 DIAGNOSIS — Z79899 Other long term (current) drug therapy: Secondary | ICD-10-CM | POA: Insufficient documentation

## 2010-12-18 DIAGNOSIS — R110 Nausea: Secondary | ICD-10-CM | POA: Insufficient documentation

## 2010-12-18 DIAGNOSIS — S069XAA Unspecified intracranial injury with loss of consciousness status unknown, initial encounter: Secondary | ICD-10-CM

## 2010-12-18 DIAGNOSIS — M545 Low back pain, unspecified: Secondary | ICD-10-CM

## 2010-12-18 DIAGNOSIS — G44309 Post-traumatic headache, unspecified, not intractable: Secondary | ICD-10-CM | POA: Insufficient documentation

## 2010-12-18 DIAGNOSIS — R5381 Other malaise: Secondary | ICD-10-CM | POA: Insufficient documentation

## 2010-12-18 DIAGNOSIS — IMO0001 Reserved for inherently not codable concepts without codable children: Secondary | ICD-10-CM | POA: Insufficient documentation

## 2010-12-18 DIAGNOSIS — S069X9A Unspecified intracranial injury with loss of consciousness of unspecified duration, initial encounter: Secondary | ICD-10-CM

## 2010-12-19 NOTE — Assessment & Plan Note (Signed)
Mallory Mendez is back regarding her multiple problems related to her head injury.  I last saw her not August of last year.  Her husband left her and she had lot of family issues.  She states that he had prescribed her not to pursue any treatment for her problems.  She states that she had been on the scopolamine patch, I wrote for last time with some benefit. She ran out of this earlier in the year.  She is having persistent balance and dizziness issues particularly when she changes directions or stands.  She has nausea associated with this as well.  She has problems with attention and memory, anxiety, sleep, headache, and then the cervicalgia as well.  REVIEW OF SYSTEMS:  Notable for trouble walking, dizziness, confusion, nausea.  Full 12-point review is in the written health and history section of the chart.  SOCIAL HISTORY:  The patient is married, lives with her daughter.  MEDICATION LIST:  Lipitor, Lasix, Pristiq, lisinopril, imipramine, Lantus, and Humalog insulin, amlodipine, Phenergan p.r.n. for headaches and nausea, Neurontin, morphine, and Percocet in the past as well.  PHYSICAL EXAMINATION:  VITAL SIGNS:  Blood pressure is 120/60, pulse 82, respiratory rate 18, she is satting 100% on room air. GENERAL:  The patient is pleasant, generally alert, although has poor memory and focus.  She had dizziness with head turning and eye tracking. She did have a few beats of nystagmus with tracking to the right and left today.  She had lot of symptomatology, when we extended her neck and she looked up at the ceiling.  Extension of the neck did cause pain. MUSCULOSKELETAL:  On palpation, all tenderness over the trapezius muscles bilaterally the top end of muscle noted.  Spurling test is grossly negative.  Strength is 5/5 except for perhaps the left upper extremity which is 4/5, but inconsistent. HEART:  Regular. CHEST:  Clear. ABDOMEN:  Soft, nontender.  She was a bit anxious.  ASSESSMENT: 1.  Post concussion syndrome with positional vertigo, headaches,     altered memory, attention, sleep disturbance, fatigue, depression,     anxiety, myofascial pain.  She may have some underlying facet     arthropathy as well in the cervical spine. 2. Chronic low back pain. 3. Chronic anxiety and depression syndrome.  PLAN: 1. The patient agreed to follow through with therapy recommendations.     Her daughter will drive her to therapy.  I will send her to [facility] for vestibular treatment as well as treatment of myofascial     and cervical pain.  Also, I would like her to have speech language     pathology to assess this patient for cognitive needs. 2. We will resume the scopolamine 1.5 mg q.72 hours as well as start     Topamax today 25 mg for headaches #30 were dispensed. 3. Consider stimulant, but I would like to get some feedback from     speech therapy first. 4. The patient may benefit from a neuropsychological assessment. 5. I will see her back here in about a month.  I answered all     questions.  I still think she has potential for     improvement.  However, it is unfortunate that she has gone another     9-10 months essentially with these persistent problems having been     unaddressed essentially.     Ranelle Oyster, M.D. Electronically Signed    ZTS/MedQ D:  12/18/2010 14:21:35  T:  12/19/2010 01:23:33  Job #:  [id number]  cc:   Melida Quitter, M.D. Fax: [phone]

## 2011-01-14 ENCOUNTER — Ambulatory Visit (HOSPITAL_COMMUNITY): Payer: Medicare Other | Admitting: Physical Therapy

## 2011-01-25 ENCOUNTER — Encounter: Payer: Medicare Other | Admitting: Physical Medicine & Rehabilitation

## 2011-01-25 ENCOUNTER — Ambulatory Visit (HOSPITAL_COMMUNITY): Payer: Medicare Other | Admitting: Physical Therapy

## 2011-01-27 ENCOUNTER — Ambulatory Visit (HOSPITAL_COMMUNITY)
Admission: RE | Admit: 2011-01-27 | Discharge: 2011-01-27 | Disposition: A | Discharge status: Home / Self Care | Payer: Medicare Other | Source: Ambulatory Visit | Attending: Physical Medicine & Rehabilitation | Admitting: Physical Medicine & Rehabilitation

## 2011-01-27 DIAGNOSIS — IMO0001 Reserved for inherently not codable concepts without codable children: Secondary | ICD-10-CM | POA: Insufficient documentation

## 2011-01-27 DIAGNOSIS — M6281 Muscle weakness (generalized): Secondary | ICD-10-CM | POA: Insufficient documentation

## 2011-01-27 DIAGNOSIS — R42 Dizziness and giddiness: Secondary | ICD-10-CM | POA: Insufficient documentation

## 2011-01-27 DIAGNOSIS — H8110 Benign paroxysmal vertigo, unspecified ear: Secondary | ICD-10-CM | POA: Insufficient documentation

## 2011-01-27 DIAGNOSIS — E119 Type 2 diabetes mellitus without complications: Secondary | ICD-10-CM | POA: Insufficient documentation

## 2011-01-28 NOTE — Progress Notes (Signed)
Physical Therapy Evaluation  Patient Name: Mallory Mendez BJYNW'G Date: 01/28/2011 HPI: bppv Symptoms/Limitations Symptoms: Pt states that December 28 2009 she fell off a pick up backwards.  Pt hospitalized for six days.  Pt states that at this time she was using a walker she has improved as far as her walking as she is able to walk without an assistive device but  she continues to get dizzy.  She states that moving her head in any direction causes her to have increased vertigo that lasts for several minutes. How long can you walk comfortably?: Limited her ability to walk secondary to having these bouts of dizziness.  Pt states that she has the bouts over 20 times a day. Repetition: Increases Symptoms Pain Assessment Currently in Pain?: Yes Pain Score:   8 Pain Location: Head Pain Orientation: Posterior Pain Onset: More than a month ago Pain Frequency: Other (Comment) (2 times a week) Multiple Pain Sites: No Past Medical History:  Past Medical History  Diagnosis Date  . Diabetes mellitus   . Hyperlipidemia   . Blood transfusion   . Arthritis    Past Surgical History:  Past Surgical History  Procedure Date  . Cholecystectomy 1984  . Spinal fusion 2006  . Hernia repair     Precautions/Restrictions falls    Prior Functioning Pt did not have any dizziness prior to her fall.    Cognition wnl    Sensation/Coordination/Flexibility wnl  Strength:  Strength for cervical mm is generally 3/5  ROM:  Flexion decreased 70%; R rot decreased 20%; L rotation decreased 35%.    Assessment Pt with a hx of hitting her head and having vertigo for over a year now.  Pt with signs and symptoms of BPPV as well as cervicogenic vertigo. PT has significant weakened cervical mm. Pt will benefit from skilled therapy to improve sx and maximize quality of life.    Mobility (including Balance) Pt becomes off balane when walking multiple times a day.      Exercise/Treatments Pt received massage,  cervical isometric ex and canalith repositioning.      Goals Pt to state that she has improved 75% in the bouts of dizziness she is experiencing.  Pt cervical strength to be wnl.  ROM to be wnl to allow safe driving.   End of Session Patient Active Problem List  Diagnoses  . Benign paroxysmal positional vertigo         [name],Mallory Mendez 01/28/2011, 8:33 AM

## 2011-02-02 ENCOUNTER — Ambulatory Visit (HOSPITAL_COMMUNITY)
Admission: RE | Admit: 2011-02-02 | Discharge: 2011-02-02 | Disposition: A | Discharge status: Home / Self Care | Payer: Medicare Other | Source: Ambulatory Visit | Attending: Physical Medicine & Rehabilitation | Admitting: Physical Medicine & Rehabilitation

## 2011-02-02 NOTE — Progress Notes (Signed)
Physical Therapy Treatment Patient Name: Mallory Mendez ZOXWR'U Date: 02/02/2011  HPI: BPPV     RX 2/2  Symptoms/Limitations Symptoms: I felt a little better after last treatment. Pain Assessment Currently in Pain?: Yes Pain Score:   4 Pain Location: Head Pain Orientation: Posterior Pain Type: Acute pain Pain Onset: More than a month ago Pain Frequency: Intermittent Pain Relieving Factors: meds Multiple Pain Sites: No  Precautions/Restrictions     Mobility (including Balance)       Exercise/Treatments   massage f/b                                                                                                                                                                                              Cervical Stretches Neck Stretch:  (eye exercises up/down; diagonals x 5 rep @) Shoulder Rolls: shoulder up,back and relax Cervical Exercises Neck Extension: Strengthening;5 reps;Seated (isometric) Neck Lateral Flexion - Right: Strengthening;5 reps;Seated (isometric) Neck Lateral Flexion - Left: 5 reps;Strengthening;Seated W Back: 10 reps X to V: 5 reps Manual Therapy Manual Therapy: Other (comment) (canalith repositioning)  Goals   End of Session Patient Active Problem List  Diagnoses  . Benign paroxysmal positional vertigo   PT - End of Session Activity Tolerance: Patient tolerated treatment well General Behavior During Session: WFL for tasks performed Cognition: [facility] for tasks performed PT Assessment and Plan Clinical Impression Statement: Pt with BPPV Rehab Potential: Good PT Frequency: Min 2X/week PT Duration: 4 weeks PT Plan: Add sitting picking items from floor to counter as well as long-sit to supine habituation exercises.  Mendez,Mallory 02/02/2011, 3:24 PM

## 2011-02-02 NOTE — Patient Instructions (Signed)
Given HEP for new exercises

## 2011-02-04 ENCOUNTER — Inpatient Hospital Stay (HOSPITAL_COMMUNITY): Admission: RE | Admit: 2011-02-04 | Payer: Medicare Other | Source: Ambulatory Visit | Admitting: Physical Therapy

## 2011-02-09 ENCOUNTER — Ambulatory Visit (HOSPITAL_COMMUNITY)
Admission: RE | Admit: 2011-02-09 | Discharge: 2011-02-09 | Disposition: A | Discharge status: Home / Self Care | Payer: Medicare Other | Source: Ambulatory Visit | Attending: Physical Medicine & Rehabilitation | Admitting: Physical Medicine & Rehabilitation

## 2011-02-09 DIAGNOSIS — E119 Type 2 diabetes mellitus without complications: Secondary | ICD-10-CM | POA: Insufficient documentation

## 2011-02-09 DIAGNOSIS — IMO0001 Reserved for inherently not codable concepts without codable children: Secondary | ICD-10-CM | POA: Insufficient documentation

## 2011-02-09 DIAGNOSIS — R42 Dizziness and giddiness: Secondary | ICD-10-CM | POA: Insufficient documentation

## 2011-02-09 DIAGNOSIS — M6281 Muscle weakness (generalized): Secondary | ICD-10-CM | POA: Insufficient documentation

## 2011-02-09 NOTE — Patient Instructions (Signed)
To complete cervical strengthening ex to increase stability

## 2011-02-09 NOTE — Progress Notes (Signed)
Physical Therapy Treatment Patient Name: Mallory Mendez ZOXWR'U Date: 02/09/2011  HPI: vertigo. Symptoms/Limitations Symptoms: I've been bumping into things. Pain Assessment Currently in Pain?: Yes Pain Score:   4 Pain Location: Head  Precautions/Restrictions     Mobility (including Balance)       Exercise/Treatments Cervical Exercises Neck Extension with Towel Roll:  (prone cervical retraction  x10) Neck Lateral Flexion - Right: Sidelying;10 reps Neck Lateral Flexion - Left: Sidelying;10 reps W Back: 10 reps Manual Therapy Manual Therapy: Other (comment) Other Manual Therapy: massage to B cervical mid trap area f/b canalith repositioning.  Canalith repositioning started long sitting with head rotated L   Goals   End of Session Patient Active Problem List  Diagnoses  . Benign paroxysmal positional vertigo   PT - End of Session Activity Tolerance: Patient tolerated treatment well General Behavior During Session: [facility] for tasks performed Cognition: [facility] for tasks performed PT Assessment and Plan Rehab Potential: Good PT Frequency: Min 2X/week PT Treatment/Interventions: Therapeutic exercise;Other (comment) (massage and manual repositioning.) PT Plan: continue to work on cervical stability.  See if doing repositining starting left was more beneficial  [name],Alyene 02/09/2011, 4:03 PM

## 2011-02-11 ENCOUNTER — Ambulatory Visit (HOSPITAL_COMMUNITY)
Admission: RE | Admit: 2011-02-11 | Discharge: 2011-02-11 | Disposition: A | Discharge status: Home / Self Care | Payer: Medicare Other | Source: Ambulatory Visit | Attending: Physical Therapy | Admitting: Physical Therapy

## 2011-02-11 DIAGNOSIS — H8110 Benign paroxysmal vertigo, unspecified ear: Secondary | ICD-10-CM

## 2011-02-11 NOTE — Patient Instructions (Signed)
Educated pt to eliminate multitasking activities in order to maintain balance for now.

## 2011-02-11 NOTE — Progress Notes (Addendum)
Physical Therapy Treatment Patient Name: Mallory Mendez Date: 02/11/2011 Time: 249-330 Charges: 40 NMR Visit: # 4 HPI: Symptoms/Limitations Symptoms: "I think it is coming from my left side, but I keep falling to my R and I am not sure why.  I am having a very difficulty time getting my words out.  It is to the point that my friends are making fun of me for it over the past few months.  Is it normal to have this many bad days with what I have going on?" Pt demonstrates significant overshooting and undershooting with LUE and is lacking about 25% of her left lateral gaze to follow objects.  Pain Assessment Currently in Pain?: No/denies Pt demonstrates R lateropulsion and explains symptoms of dysarthria  Pt is demonstrates sign and symptoms related to central vertigo compared to peripheral vertigo today.  Exercise/Treatments  Eye stabilization exercise - pt had consistent dizziness that did not dissipate with smooth eye pursuit to the L.   1. head turns and with stationary object x10 ea direction  2. Head stable with moving object x10 each direcion  Coordination exercise:  1. Mimic Therapist x15 positions w/UE and LE w/10 sec hold to each and cueing for proper alignment. - reports   feeling "foogy headed"  2. LUE finger to nose: x10 needs max cueing for overshooting and undershooting - had moderate increase in   Dizziness  3. Rapidly alternating pronation and supination x1 minute.  Pt slow with LUE and had difficulty coordinating  4. Rapidly heel up shin 2x10 with BLE (one at a time).  Pt with decreased speed with LLE. Pt had LOB with mod A from therapist for recovery when ambulating and multi-tasking.  Educated pt to stop multitasking while performing higher level activities in order to decrease her falls risk.     Goals   End of Session Patient Active Problem List  Diagnoses  . Benign paroxysmal positional vertigo   PT Assessment and Plan Clinical Impression Statement: Pt has  extremely impaired coordination deficits that are contributing to her increased dizziness.  Pt did not demonstrate increased nystagmus today, but had notable LOB with min A for full recovery  PT Plan: Cont. to progress  Tracy Kinner 02/11/2011, 4:50 PM

## 2011-02-18 ENCOUNTER — Ambulatory Visit (HOSPITAL_COMMUNITY)
Admission: RE | Admit: 2011-02-18 | Discharge: 2011-02-18 | Disposition: A | Discharge status: Home / Self Care | Payer: Medicare Other | Source: Ambulatory Visit | Attending: Physical Therapy | Admitting: Physical Therapy

## 2011-02-18 DIAGNOSIS — H8110 Benign paroxysmal vertigo, unspecified ear: Secondary | ICD-10-CM

## 2011-02-18 NOTE — Progress Notes (Signed)
Physical Therapy Treatment Patient Details  Name: Mallory Mendez MRN: [id number] Date of Birth: 1955-08-07  Today's Date: 02/18/2011 Time: 6295-2841 Time Calculation (min): 55 min Charges: 55 min NMR Visit#: 5 of 8 Re-eval: 02/25/11  Subjective: Symptoms/Limitations Symptoms: "I am still really off balance and falling to my right.  I have been trying to work on focusing on my walking and not doing other activities.  I am not sure why, but I get so tired when I am trying to focus on one task."   Exercise/Treatments Balance and Coordination: Sit to stand x5 w/cueing for focus on single point Static standing 2x1 minute focus on single point Static standing feet together eyes closed 3x1 minutes - cueing for focus on LE points (i.e. Toe, ankle, knee and hip placement in space) Tandem Stance R and L 2x1 minute each focus on single point Unsupported sitting with eyes closed - cueing for awareness of LLE motion and position.  Toe flexion and extension x30 sec  Ankle DF and PF x30 sec  Ankle IN and EV x30 sec   Ankle CW and CCW x30 sec each  Knee extension and flexion - 3x5 Gait for coordination x10 minutes with mod A without holding onto outside objects.      Physical Therapy Assessment and Plan PT Assessment and Plan Clinical Impression Statement: Pt required moderate assistance with gait and standing activities today and needed motivation to not place hands of the wall.  Pt was able to coordinate LLE knee extension to within 10% of RLE at the end of NMR training and required less assistance with tandem stance with the use of a visual aide.  Pt continues to have a decrease in proprioceptive awareness.  PT Plan: Continue to work on Scientific laboratory technician.  Called MD to discuss OT and SLP order for UE coordination and attention.      Goals    Problem List Patient Active Problem List  Diagnoses  . Benign paroxysmal positional vertigo    PT - End of Session Activity Tolerance:  Patient limited by fatigue  Mallory Mendez 02/18/2011, 4:41 PM

## 2011-02-23 ENCOUNTER — Encounter (INDEPENDENT_AMBULATORY_CARE_PROVIDER_SITE_OTHER): Payer: Self-pay | Admitting: General Surgery

## 2011-02-23 ENCOUNTER — Inpatient Hospital Stay (HOSPITAL_COMMUNITY): Admission: RE | Admit: 2011-02-23 | Payer: Medicare Other | Source: Ambulatory Visit | Admitting: *Deleted

## 2011-02-23 ENCOUNTER — Telehealth (HOSPITAL_COMMUNITY): Payer: Self-pay | Admitting: *Deleted

## 2011-02-25 ENCOUNTER — Inpatient Hospital Stay (HOSPITAL_COMMUNITY): Admission: RE | Admit: 2011-02-25 | Payer: Medicare Other | Source: Ambulatory Visit | Admitting: Physical Therapy

## 2011-02-25 ENCOUNTER — Telehealth (HOSPITAL_COMMUNITY): Payer: Self-pay

## 2011-02-26 ENCOUNTER — Encounter: Payer: Medicare Other | Attending: Physical Medicine & Rehabilitation | Admitting: Physical Medicine & Rehabilitation

## 2011-02-26 DIAGNOSIS — R5383 Other fatigue: Secondary | ICD-10-CM | POA: Insufficient documentation

## 2011-02-26 DIAGNOSIS — F341 Dysthymic disorder: Secondary | ICD-10-CM

## 2011-02-26 DIAGNOSIS — M545 Low back pain, unspecified: Secondary | ICD-10-CM

## 2011-02-26 DIAGNOSIS — G8929 Other chronic pain: Secondary | ICD-10-CM | POA: Insufficient documentation

## 2011-02-26 DIAGNOSIS — R5381 Other malaise: Secondary | ICD-10-CM | POA: Insufficient documentation

## 2011-02-26 DIAGNOSIS — F909 Attention-deficit hyperactivity disorder, unspecified type: Secondary | ICD-10-CM | POA: Insufficient documentation

## 2011-02-26 DIAGNOSIS — S069XAA Unspecified intracranial injury with loss of consciousness status unknown, initial encounter: Secondary | ICD-10-CM

## 2011-02-26 DIAGNOSIS — S069X9A Unspecified intracranial injury with loss of consciousness of unspecified duration, initial encounter: Secondary | ICD-10-CM

## 2011-02-26 DIAGNOSIS — R42 Dizziness and giddiness: Secondary | ICD-10-CM | POA: Insufficient documentation

## 2011-02-26 DIAGNOSIS — R51 Headache: Secondary | ICD-10-CM | POA: Insufficient documentation

## 2011-02-26 DIAGNOSIS — F0781 Postconcussional syndrome: Secondary | ICD-10-CM | POA: Insufficient documentation

## 2011-02-26 NOTE — Assessment & Plan Note (Signed)
Mallory Mendez is back regarding her head injury.  I had seen her since June.  I am still not sure why she did not come to her last appointment.  It was scheduled in July.  She is working on vestibular treatments with therapies and finding this is helping somewhat.  She uses the meclizine p.r.n. for dizziness, but this does not assist a great deal.  Topamax has helped her headaches.  She lost her cane recently.  She is still struggling with attention problems.  She rates her pain at 4-5/10 usually is in the head as well as low back at times.  Sleep is fair.  REVIEW OF SYSTEMS:  Notable for confusion, anxiety, trouble walking, dizziness and high sugars.  Full 12-point review is in the written health and history section of the chart.  SOCIAL HISTORY:  Unchanged.  PHYSICAL EXAMINATION:  VITAL SIGNS:  Blood pressure is 126/77, pulse is 96, respiratory rate 18 and she is satting 100% on room air.  The patient is pleasant, alert and oriented x3. GENERAL:  Affect is generally bright and appropriate.  She was anxious at times.  When she is stiff remained, she still had some problems with balance and posterior lean.  She did not have her cane.  She has poor attention and is often very anxious.  She did have some nystagmus with tracking to the right and left today. EXTREMITIES:  Strength is 5/5 and normal sensory function of the most part. HEART:  Regular. CHEST:  Clear. ABDOMEN:  Soft, nontender.  ASSESSMENT: 1. Post concussion syndrome.  This positional vertigo headaches, poor     attention, memory and sleep was associated with fatigue, anxiety     and depression. 2. Chronic low back pain. 3. History of chronic anxiety and depression as well as history of     attention deficit hyperactivity disorder.  PLAN: 1. Continue with physical therapy. 2. We will push Topamax to 50 mg nightly. 3. Ritalin retrial.  We will start with 5 twice a day at 7 a.m. and     12, may need insurance authorization  apparently. 4. Consider antidepressant/consider counseling. 5. Meclizine p.r.n. for dizziness. 6. See her back in a month.     Ranelle Oyster, M.D. Electronically Signed    ZTS/MedQ D:  02/26/2011 14:57:52  T:  02/26/2011 22:56:42  Job #:  [id number]  cc:   Melida Quitter, M.D. Fax: [phone]

## 2011-03-03 ENCOUNTER — Ambulatory Visit (HOSPITAL_COMMUNITY)
Admission: RE | Admit: 2011-03-03 | Discharge: 2011-03-03 | Disposition: A | Discharge status: Home / Self Care | Payer: Medicare Other | Source: Ambulatory Visit | Attending: *Deleted | Admitting: *Deleted

## 2011-03-03 DIAGNOSIS — H8110 Benign paroxysmal vertigo, unspecified ear: Secondary | ICD-10-CM

## 2011-03-03 NOTE — Patient Instructions (Addendum)
Pt to complete HEP more than one time a day

## 2011-03-03 NOTE — Progress Notes (Signed)
Physical Therapy Evaluation  Patient Details  Name: Mallory Mendez MRN: [id number] Date of Birth: 01-Feb-1956  Today's Date: 03/03/2011 Time: 1112-1207 Time Calculation (min): 55 min Visit#:1 of 6  For re-evaluation.  Re-eval completed today Re-eval: 03/24/11  Past Medical History:  Past Medical History  Diagnosis Date  . Diabetes mellitus   . Hyperlipidemia   . Blood transfusion   . Arthritis   . History of back surgery     multiple  . Hypertension   . Hernia     incisional   Past Surgical History:  Past Surgical History  Procedure Date  . Cholecystectomy 1984  . Spinal fusion 2006  . Hernia repair     09/21/2010    Subjective Symptoms/Limitations Symptoms: Pt states that she went to see the MD last week.  The patient states that he wanted her to continue therapy   It just hits me.  I fall into the wall constantly.  I'm not getting dizzy as often but  it still takes me awhile to recover.  Pt states her headaches are less frequent and her head feels more stable. Pain Assessment Currently in Pain?: No/denies  Precautions/Restrictions    falls Prior Functioning  Home Living Type of Home: House Lives With: Daughter Prior Function Level of Independence: Independent with basic ADLs Driving:  (only when she has to) Able to Take Stairs Reciprically: No  Cognition Cognition Memory: Impaired Memory Impairment:  (Pt states she is having both long and short term memory loss)  Sensation/Coordination/Flexibility    Assessment Cervical AROM Cervical Flexion: deceased 70% due to fear of dizziness at initial evaluation now decreased 20%. Cervical - Right Rotation: decreased 20% Cervical - Left Rotation: decresed 35% Cervical Strength Cervical Flexion: 3/5 Cervical Extension: 3/5 Cervical - Right Side Bend: 3/5  Mobility (including Balance)  decreased static and dynamic balance.       Exercise/Treatments Stretches Upper Trapezius Stretch:  (eye exercises  focusing on finger then look  r to finger l ..) Shoulder Rolls: 5 times Neck Exercises Neck Extension: 10 reps;Seated (isometric) Neck Lateral Flexion - Right: Other (comment) (10 isometric seated; 10 left sidelying) Neck Lateral Flexion - Left: 10 reps;Seated;Other (comment) (10 isometric seated; 10 right side-lying) Neck Rotation - Right: AROM;10 reps (with gentle contract relax to increase rotation) Neck Rotation - Left: AROM;10 reps (with gentle contract/relax to increase rotation) Additional Neck Exercises    Manual Therapy Other Manual Therapy: massage with mod spasm in L mid trap area.  Canalith repositiong long sitting head L rotated 45 degree extended 30 degree  Physical Therapy Assessment and Plan PT Assessment and Plan Clinical Impression Statement: Worked on cervical ROM/strength as well as manual for spasm and canalith repositioning.  Will need balance training as well Rehab Potential: Fair PT Frequency: Min 2X/week PT Duration:  (3 more weeks D/C if no progress) PT Treatment/Interventions: Therapeutic exercise;Other (comment) (manual for spasm, canalith repositioning.) PT Plan: continue 2 times per week for 3 more weeks.    Goals PT Short Term Goals PT Short Term Goal 1 - Progress: Met PT Short Term Goal 2 - Progress: Met PT Short Term Goal 3 - Progress: Not met PT Short Term Goal 4: new goal 8/29- ROM to be wnl PT Short Term Goal 5: new goal 8/29. cervical strength to be at least 4/5 PT Long Term Goals PT Long Term Goal 1 - Progress: Not met PT Long Term Goal 2 - Progress: Not met  Problem List Patient Active Problem List  Diagnoses  . Benign paroxysmal positional vertigo    PT - End of Session Activity Tolerance: Patient tolerated treatment well General Behavior During Session: [facility] for tasks performed Cognition: [facility] for tasks performed   [name],Liannah 03/03/2011, 12:18 PM  Physician Documentation Your signature is required to indicate approval of the  treatment plan as stated above.  Please sign and either send electronically or make a copy of this report for your files and return this physician signed original.   Please mark one 1.__approve of plan  2. ___approve of plan with the following conditions.   ______________________________                                                          _____________________ Physician Signature                                                                                                             Date

## 2011-03-04 ENCOUNTER — Ambulatory Visit (HOSPITAL_COMMUNITY): Payer: Medicare Other | Admitting: *Deleted

## 2011-03-04 ENCOUNTER — Telehealth (HOSPITAL_COMMUNITY): Payer: Self-pay

## 2011-03-29 ENCOUNTER — Ambulatory Visit (HOSPITAL_COMMUNITY)
Admission: RE | Admit: 2011-03-29 | Discharge: 2011-03-29 | Disposition: A | Discharge status: Home / Self Care | Payer: Medicare Other | Source: Ambulatory Visit | Attending: Physical Medicine & Rehabilitation | Admitting: Physical Medicine & Rehabilitation

## 2011-03-29 DIAGNOSIS — R42 Dizziness and giddiness: Secondary | ICD-10-CM | POA: Insufficient documentation

## 2011-03-29 DIAGNOSIS — Z5189 Encounter for other specified aftercare: Secondary | ICD-10-CM | POA: Insufficient documentation

## 2011-03-29 DIAGNOSIS — H8110 Benign paroxysmal vertigo, unspecified ear: Secondary | ICD-10-CM

## 2011-03-29 DIAGNOSIS — R41841 Cognitive communication deficit: Secondary | ICD-10-CM | POA: Insufficient documentation

## 2011-03-29 DIAGNOSIS — F0781 Postconcussional syndrome: Secondary | ICD-10-CM | POA: Insufficient documentation

## 2011-03-29 NOTE — Progress Notes (Signed)
Speech Language Pathology Evaluation Patient Details  Name: Mallory Mendez MRN: [id number] Date of Birth: 1956/02/25  Today's Date: 03/29/2011 Time: 1030-1110 Time Calculation (min): 40 min  Past Medical History:  Past Medical History  Diagnosis Date  . Diabetes mellitus   . Hyperlipidemia   . Blood transfusion   . Arthritis   . History of back surgery     multiple  . Hypertension   . Hernia     incisional   Past Surgical History:  Past Surgical History  Procedure Date  . Cholecystectomy 1984  . Spinal fusion 2006  . Hernia repair     09/21/2010   HPI:  Symptoms/Limitations Symptoms: "I just can't remember anything or get anything done." Special Tests: Cognitive-Linguistic Evaluation Pain Assessment Currently in Pain?: No/denies  Prior Functional Status  Cognitive/Linguistic Baseline: Within functional limits (WFL prior to fall June 2011) Primary Language: English Type of Home: House Lives With: Daughter (Estranged husband recently back in her life) Receives Help From: Family Vocation: On disability (On disability for back pain)  Cognition  Overall Cognitive Status: Impaired Arousal/Alertness: Awake/alert Orientation Level: Oriented to person;Oriented to place;Oriented to situation (Did not know day/date) Attention: Sustained;Selective;Alternating;Divided Sustained Attention: Impaired Sustained Attention Impairment: Verbal complex;Functional complex Selective Attention: Impaired Selective Attention Impairment: Verbal complex;Functional complex Alternating Attention: Impaired Alternating Attention Impairment: Verbal complex;Functional complex Divided Attention: Impaired Divided Attention Impairment: Verbal complex;Functional complex Memory: Impaired Memory Impairment: Storage deficit;Retrieval deficit;Decreased recall of new information;Decreased short term memory;Prospective memory Decreased Short Term Memory: Verbal complex;Functional complex Awareness:  Appears intact Problem Solving: Appears intact Executive Function: Organizing;Decision Psychologist, sport and exercise: Impaired Organizing Impairment: Verbal complex;Functional complex Decision Making: Impaired Decision Making Impairment: Verbal complex;Functional complex Self Monitoring: Impaired Self Monitoring Impairment: Verbal complex;Functional complex Behaviors: Restless (wringing her hands on top of her purse) Safety/Judgment: Appears intact Comments: Acknowledges that driving is dangerous, leaving stove burner on... [place] Scales of Cognitive Functioning: Automatic/appropriate  Comprehension  Auditory Comprehension Yes/No Questions: Within Functional Limits Commands: Within Functional Limits Conversation: Simple Interfering Components: Attention;Anxiety;Working memory;Processing speed EffectiveTechniques: Media planner: Not tested Reading Comprehension Reading Status: Not tested  Expression   TBA Pt complains of losing her train of thought in conversation and not being able to explain herself well.  Oral/Motor   [facility]  SLP Goals  Home Exercise SLP Goal: Patient will Perform Home Exercise Program: with supervision, verbal cues required/provided SLP Short Term Goals SLP Short Term Goal 1: Pt will implement memory strategies in therapy activities with mod cues to increase recall. SLP Short Term Goal 2: Pt will record 3 or greater weekly appointments, reminders, to-do items in her smart phone to facilitate task completion. SLP Short Term Goal 3: Pt will complete selective and alternating attention tasks (moderately complex) with 90% acc and mod cues. SLP Short Term Goal 4: Pt will complete moderate-level thought organization and planning activities with 90% acc and mod assist. SLP Long Term Goals SLP Long Term Goal 1: Pt will utilize compensatory memory strategies to increase recall  of functional information/activities with min assist. SLP Long Term Goal 2: Pt will utilize compensatory strategies for attention in dynamic environment with min assist. SLP Long Term Goal 3: Pt will be independent with her personal memory assistive device (smart phone). SLP Long Term Goal 4: Pt and family education with be completed  Assessment/Plan  Patient Active Problem List  Diagnoses  . Benign paroxysmal positional vertigo  . Post concussive syndrome   SLP - End of  Session Activity Tolerance: Patient tolerated treatment well General Behavior During Session: [facility] for tasks performed Cognition: Impaired  SLP Assessment/Plan Clinical Impression Statement: Pt presents with high level cognitive deficits characterized by decreased working memory, decreased storage and recall, decreased complex attention skills, decreased complex planning and organization skills which negatively impact safety  and recall. Speech Therapy Frequency: min 2x/week Duration: 4 weeks Treatment/Interventions: Environmental controls;Compensatory techniques;Cueing hierarchy;Cognitive reorganization;Functional tasks;Internal/external aids;SLP instruction and feedback;Patient/family education Potential to Achieve Goals: Good Potential Considerations: Ability to learn/carryover information  Mallory Mendez, CCC-SLP [phone]  Mendez,Mallory 03/29/2011, 4:18 PM

## 2011-03-29 NOTE — Patient Instructions (Addendum)
HEP

## 2011-03-29 NOTE — Progress Notes (Signed)
Physical Therapy Evaluation  Patient Details  Name: Mallory Mendez MRN: [id number] Date of Birth: 1955-10-12  Today's Date: 03/29/2011 Time: 4098-1191 Time Calculation (min): 58 min Visit#: 1  of 8   Re-eval: 04/26/11    Past Medical History:  Past Medical History  Diagnosis Date  . Diabetes mellitus   . Hyperlipidemia   . Blood transfusion   . Arthritis   . History of back surgery     multiple  . Hypertension   . Hernia     incisional   Past Surgical History:  Past Surgical History  Procedure Date  . Cholecystectomy 1984  . Spinal fusion 2006  . Hernia repair     09/21/2010    Subjective Symptoms/Limitations Symptoms:Mallory Mendez is a known patient to this clinic.  She initially started therapy in July for BPPV.  Her symptoms have improved from having 20 bouts of BPPV a day to eight but she is still experiencing this debilitating sensation.   Pt states that she was ill and did not return to the cliinic.  She was then  required to get a new  prescription from the MD due to her number of no shows.  Pt states that she is still having headaches 3-4 times a week.  She states  that they feel like pressure headaches and seem to be insidious in onset.  She is also having difficulty in focusing.  The patient states she tries to force herself to focus but it does not seem to help.  She will forget appointments,only complete half of a task, she has even forgotten that  she lit a cigarette.  The patient also complains of dizzines.  She states this occurs with fast head movement.   The patient states that this is with any movement.  The patient states that the dizziness occurs approximately 6-8 times a day. How long can you sit comfortably?: no problem How long can you stand comfortably?: no problem How long can you walk comfortably?: no problem  Pain Assessment Currently in Pain?: No/denies (when she has H/A can go to a 9) Pain Location: Head Pain Orientation: Posterior Pain Type:  Other (Comment) (H/A) Pain Relieving Factors: meds  Precautions/Restrictions   FALLS  Prior Functioning  Pt was I in all activities prior to TBI in June of 2011   Assessment   cervical instability as well as concussion induced BPPV.  Exercise/Treatments Stretches   Neck Exercises Neck Retraction:  (R SL cervical sb x10; L SL cervical sb x5 supine c ret.x10) Neck Flexion: 10 reps Neck Rotation - Left: 10 reps Additional Neck Exercises       Physical Therapy Assessment and Plan PT Assessment and Plan Clinical Impression Statement: Pt with TBI who is experiencing instablility of the cervical area as well as positional vertigo.  Pt was given a HEP and then will be seen by therapy two times a week to improve her quality of life. Clinical Impairments Affecting Rehab Potential: decreased strength, BPPV PT Frequency: Min 2X/week PT Duration: 6 weeks PT Treatment/Interventions: Therapeutic exercise;Patient/family education;Neuromuscular re-education PT Plan: Therapy will begin mid-october pt is to complete HEP until this time    Goals Home Exercise Program Pt will Perform Home Exercise Program: with supervision, verbal cues required/provided PT Short Term Goals Time to Complete Short Term Goals: 3 weeks PT Short Term Goal 1: PT to state H/A are decreased 50%  PT Short Term Goal 2: Pain level associated with H/A to be no greater than 5 PT  Short Term Goal 3: Pt to not experience BPPV more than 4 times a day PT Long Term Goals Time to Complete Long Term Goals:  (6 wks) PT Long Term Goal 1: I in advance HEP 6wk PT Long Term Goal 2: Only 2 H/A per week-6 wk Long Term Goal 3: Pain associated with H/A to be 3 or less-6 w, Long Term Goal 4: BPPV to occur 2 or less a day-6 wkincrease cervical   Problem List Patient Active Problem List  Diagnoses  . Benign paroxysmal positional vertigo    PT - End of Session Activity Tolerance: Patient tolerated treatment well General Behavior  During Session: [facility] for tasks performed Cognition: [facility] for tasks performed   [name],Deamber 03/29/2011, 1:34 PM  Physician Documentation Your signature is required to indicate approval of the treatment plan as stated above.  Please sign and either send electronically or make a copy of this report for your files and return this physician signed original.   Please mark one 1.__approve of plan  2. ___approve of plan with the following conditions.   ______________________________                                                          _____________________ Physician Signature                                                                                                             Date

## 2011-03-30 ENCOUNTER — Encounter: Payer: Medicare Other | Attending: Physical Medicine & Rehabilitation | Admitting: Physical Medicine & Rehabilitation

## 2011-03-30 DIAGNOSIS — M545 Low back pain, unspecified: Secondary | ICD-10-CM

## 2011-03-30 DIAGNOSIS — G8929 Other chronic pain: Secondary | ICD-10-CM | POA: Insufficient documentation

## 2011-03-30 DIAGNOSIS — R42 Dizziness and giddiness: Secondary | ICD-10-CM | POA: Insufficient documentation

## 2011-03-30 DIAGNOSIS — F0781 Postconcussional syndrome: Secondary | ICD-10-CM

## 2011-03-30 DIAGNOSIS — F17200 Nicotine dependence, unspecified, uncomplicated: Secondary | ICD-10-CM | POA: Insufficient documentation

## 2011-03-30 DIAGNOSIS — F411 Generalized anxiety disorder: Secondary | ICD-10-CM | POA: Insufficient documentation

## 2011-03-30 DIAGNOSIS — F988 Other specified behavioral and emotional disorders with onset usually occurring in childhood and adolescence: Secondary | ICD-10-CM | POA: Insufficient documentation

## 2011-03-30 DIAGNOSIS — F329 Major depressive disorder, single episode, unspecified: Secondary | ICD-10-CM | POA: Insufficient documentation

## 2011-03-30 DIAGNOSIS — S069XAA Unspecified intracranial injury with loss of consciousness status unknown, initial encounter: Secondary | ICD-10-CM

## 2011-03-30 DIAGNOSIS — R413 Other amnesia: Secondary | ICD-10-CM | POA: Insufficient documentation

## 2011-03-30 DIAGNOSIS — F341 Dysthymic disorder: Secondary | ICD-10-CM

## 2011-03-30 DIAGNOSIS — S069X9A Unspecified intracranial injury with loss of consciousness of unspecified duration, initial encounter: Secondary | ICD-10-CM

## 2011-03-30 DIAGNOSIS — G479 Sleep disorder, unspecified: Secondary | ICD-10-CM | POA: Insufficient documentation

## 2011-03-30 DIAGNOSIS — F3289 Other specified depressive episodes: Secondary | ICD-10-CM | POA: Insufficient documentation

## 2011-03-30 NOTE — Assessment & Plan Note (Signed)
HISTORY OF PRESENT ILLNESS:  She is back regarding post concussion syndrome.  She now in physical therapy with vestibular assessment.  She had her evaluation yesterday.  She had BPPV diagnosed and has begun sessions to work on her balance and desensitization.  She is on Topamax for headaches at night as well as Ritalin.  Meclizine is used occasionally for dizziness.  She is not sleeping well.  Headaches are a problem, particularly in the evening hours over the crown of her head. Family states that she never rests during the day and seems to be always going.  REVIEW OF SYSTEMS:  Notable for the above.  Full 12-point review is in the written health history section of the chart.  SOCIAL HISTORY:  The patient lives alone but mother is going to help her temporarily.  She has other family involved.  She smokes still 1/2-pack of cigarettes per day.  MEDICATIONS:  On medication examination, I discovered she takes her Pristiq at nighttime to try and simplify her medication regimen.  She also takes other meds at night including imipramine.  PHYSICAL EXAMINATION:  VITAL SIGNS:  Blood pressure 159/94, pulse 91, respiratory rate 14, and she is saturating 97% on room air. NEUROLOGIC:  The patient is pleasant, alert, and oriented x3.  Affect is showing bright and appropriate.  She remains anxious, but I felt she is overall bit more grounded today and attentive.  She still has balance issues and tends to drift to the right and weans posteriorly.  She did not have her cane.  Strength is 5/5 with normal sensory function today. She had nystagmus with lateral gaze in either direction and head turning more to the left than right. HEART:  Regular. CHEST:  Clear. ABDOMEN:  Soft and nontender.  ASSESSMENT: 1. Post-concussion syndrome with positional vertigo, attention     deficit, memory and sleep disorder, anxiety and depression. 2. History of chronic low back pain.  PLAN: 1. We will increase  Ritalin to 10 mg daily at 7:00 a.m. and 12 noon. 2. Increase Topamax to 100 mg at night.  Discussed the importance of     sleeping every night and getting 8-10 hours at least in her case.     She needs to settle down a bit during the day and try not to do so     much as well and family will work on cuing her there. 3. Sitting with physical therapy as this is vital for her vestibular     desensitization. 4. Change Pristiq to morning administration 100 mg q.a.m. 5. Meclizine p.r.n. for dizziness, although this is not vital in my     opinion here. 6. I will see her back here in about a month.     Ranelle Oyster, M.D. Electronically Signed    ZTS/MedQ D:  03/30/2011 10:24:53  T:  03/30/2011 10:57:47  Job #:  [id number]  cc:   Melida Quitter, M.D. Fax: [phone]

## 2011-04-01 ENCOUNTER — Ambulatory Visit (HOSPITAL_COMMUNITY)
Admission: RE | Admit: 2011-04-01 | Discharge: 2011-04-01 | Disposition: A | Discharge status: Home / Self Care | Payer: Medicare Other | Source: Ambulatory Visit | Attending: Internal Medicine | Admitting: Internal Medicine

## 2011-04-01 DIAGNOSIS — F0781 Postconcussional syndrome: Secondary | ICD-10-CM

## 2011-04-01 NOTE — Progress Notes (Signed)
Speech Language Pathology Treatment Patient Details  Name: JAMIE HAFFORD MRN: [id number] Date of Birth: 19-Jan-1956 Re-authorization: 04/26/2011   Today's Date: 04/01/2011 Time: 7829-5621 Time Calculation (min): 42 min  HPI:  Symptoms/Limitations Symptoms: "I saw my doctor this morning." Pain Assessment Currently in Pain?: No/denies Multiple Pain Sites: No   Treatment  Cognitive-Linguistic Therapy Patient/Family Education Home Exercise Program  SLP Goals  Home Exercise SLP Goal: Patient will Perform Home Exercise Program: with supervision, verbal cues required/provided SLP Goal: Perform Home Exercise Program - Progress: Other (comment) (Encouraged to add all appts to phone/calendar) SLP Short Term Goals SLP Short Term Goal 1: Pt will implement memory strategies in therapy activities with mod cues to increase recall. SLP Short Term Goal 1 - Progress: Progressing toward goal SLP Short Term Goal 2: Pt will record 3 or greater weekly appointments, reminders, to-do items in her smart phone to facilitate task completion. SLP Short Term Goal 2 - Progress: Progressing toward goal SLP Short Term Goal 3: Pt will complete selective and alternating attention tasks (moderately complex) with 90% acc and mod cues. SLP Short Term Goal 3 - Progress: Progressing toward goal SLP Short Term Goal 4: Pt will complete moderate-level thought organization and planning activities with 90% acc and mod assist. SLP Short Term Goal 4 - Progress: Progressing toward goal SLP Long Term Goals SLP Long Term Goal 1: Pt will utilize compensatory memory strategies to increase recall of functional information/activities with min assist. SLP Long Term Goal 1 - Progress: Progressing toward goal SLP Long Term Goal 2: Pt will utilize compensatory strategies for attention in dynamic environment with min assist. SLP Long Term Goal 2 - Progress: Progressing toward goal SLP Long Term Goal 3: Pt will be independent with  her personal memory assistive device (smart phone). SLP Long Term Goal 3 - Progress: Progressing toward goal SLP Long Term Goal 4: Pt and family education with be completed SLP Long Term Goal 4 - Progress: Progressing toward goal  Assessment/Plan  Patient Active Problem List  Diagnoses  . Benign paroxysmal positional vertigo  . Post concussive syndrome   SLP - End of Session Activity Tolerance: Patient tolerated treatment well General Behavior During Session: [facility] for tasks performed Cognition: Impaired  SLP Assessment/Plan Clinical Impression Statement: Mrs. Fenderson was accompanied to today's appointment by her husband. She volunteered spontaneously that she saw Dr. Riley Kill and Dr. Gerilyn Pilgrim this week and was able to give me several details about the appointments. She added one appointment to her phone calendar and set up one alarm for wake up using her phone. In session, she added 3 more appointments, created a voice recording, and created a task/to-do list with mod cues. She needs assist to locate functions within her phone and it is in need of organization. She was encouraged to organize her phone this week end with her daughter who is visiting from [place]. Pt appears motivated and was excited to implement compensatory strategies at home. Speech Therapy Frequency: min 2x/week Duration: 4 weeks Treatment/Interventions: Environmental controls;Compensatory techniques;Cueing hierarchy;Cognitive reorganization;Functional tasks;Internal/external aids;SLP instruction and feedback;Patient/family education Potential to Achieve Goals: Good Potential Considerations: Ability to learn/carryover information  [name], CCC-SLP [phone]  Breyon Blass 04/01/2011, 2:48 PM

## 2011-04-02 ENCOUNTER — Encounter (INDEPENDENT_AMBULATORY_CARE_PROVIDER_SITE_OTHER): Payer: Self-pay | Admitting: General Surgery

## 2011-04-05 ENCOUNTER — Encounter (INDEPENDENT_AMBULATORY_CARE_PROVIDER_SITE_OTHER): Payer: Self-pay | Admitting: General Surgery

## 2011-04-06 ENCOUNTER — Inpatient Hospital Stay (HOSPITAL_COMMUNITY): Admission: RE | Admit: 2011-04-06 | Payer: Medicare Other | Source: Ambulatory Visit | Admitting: Speech Pathology

## 2011-04-06 ENCOUNTER — Ambulatory Visit (INDEPENDENT_AMBULATORY_CARE_PROVIDER_SITE_OTHER): Payer: Medicaid Other | Admitting: General Surgery

## 2011-04-06 ENCOUNTER — Encounter (INDEPENDENT_AMBULATORY_CARE_PROVIDER_SITE_OTHER): Payer: Self-pay | Admitting: General Surgery

## 2011-04-06 VITALS — BP 142/88 | HR 76 | Temp 98.4°F | Resp 16 | Ht 63.0 in | Wt 154.4 lb

## 2011-04-06 DIAGNOSIS — R1900 Intra-abdominal and pelvic swelling, mass and lump, unspecified site: Secondary | ICD-10-CM

## 2011-04-06 DIAGNOSIS — K429 Umbilical hernia without obstruction or gangrene: Secondary | ICD-10-CM

## 2011-04-06 NOTE — Progress Notes (Signed)
Subjective:     Patient ID: Mallory Mendez, female   DOB: [birth year], 55 y.o.   MRN: [id number]  HPIMs. Overall returns she had a very long hernia done in the suprapubic area that had been previously repaired over an regional that I repaired with an open procedure in March 12 and she's not had previous will follow problems with swelling in the lower abdomen the nail she's guide a bulge that is really cannot navel and upper abdomen to the left she is continued on the multiple chronic pain medications but says she has been able to reduce the pain medication by approximately 1/3. She took a laxative prior to her visit here and where I have noted that she's been extremely constipated intermittently she does not appear to be constipated today on physical exam I last saw her in May 2012 at which time she appeared to be due in satisfactory condition she had another showed and August.  On examination today she's got a death note soft tissue fullness above the umbilicus but over the left rectus area and I cannot distinguish exactly on physical exam through this actually originate I wonder if this is a umbilical hernia that pushed way over to the left or exactly what. I cannot appreciate a bulge in the lower right operated on in March   Review of Systems  Past Surgical History  Procedure Date  . Cholecystectomy 1984  . Spinal fusion 2006  . Hernia repair     09/21/2010  . Eye surgery 2010    bilateral  . Abdominal hysterectomy   . Cerebral aneurysm repair    Current Outpatient Prescriptions  Medication Sig Dispense Refill  . ALPRAZolam (XANAX) 1 MG tablet Take 1 mg by mouth at bedtime as needed.        Marland Kitchen atorvastatin (LIPITOR) 40 MG tablet Take 40 mg by mouth daily.        Marland Kitchen desvenlafaxine (PRISTIQ) 100 MG 24 hr tablet Take 100 mg by mouth daily.        Marland Kitchen esomeprazole (NEXIUM) 40 MG capsule Take 40 mg by mouth daily before breakfast.        . Estradiol-Norgestimate (PREFEST) 1/1-0.09 MG (15/15)  TABS Take by mouth.        . gabapentin (NEURONTIN) 800 MG tablet Take 800 mg by mouth 3 (three) times daily.        Marland Kitchen imipramine (TOFRANIL) 50 MG tablet Take 50 mg by mouth at bedtime.        . insulin glargine (LANTUS) 100 UNIT/ML injection Inject 100 Units into the skin at bedtime.        . insulin lispro (HUMALOG) 100 UNIT/ML injection Inject 100 Units into the skin 3 (three) times daily before meals.        Marland Kitchen lisinopril (PRINIVIL,ZESTRIL) 20 MG tablet Take 20 mg by mouth daily.        Marland Kitchen lubiprostone (AMITIZA) 24 MCG capsule Take 24 mcg by mouth 2 (two) times daily with a meal.        . methylphenidate (RITALIN) 10 MG tablet Take 10 mg by mouth 2 (two) times daily.        . minocycline (DYNACIN) 100 MG tablet Take 100 mg by mouth 2 (two) times daily.        Marland Kitchen morphine (MSIR) 30 MG tablet Take 30 mg by mouth every 4 (four) hours as needed.        . ondansetron (ZOFRAN) 4 MG tablet Take 4 mg  by mouth every 8 (eight) hours as needed.        Marland Kitchen oxyCODONE-acetaminophen (PERCOCET) 10-325 MG per tablet Take 1 tablet by mouth every 4 (four) hours as needed.        . topiramate (TOPAMAX) 100 MG tablet daily.      Marland Kitchen torsemide (DEMADEX) 20 MG tablet Take 20 mg by mouth daily.        Marland Kitchen zolpidem (AMBIEN) 10 MG tablet Take 10 mg by mouth at bedtime as needed.         Allergies  Allergen Reactions  . Cymbalta (Duloxetine Hcl) Anaphylaxis  . Latex        Objective:   Physical ExamBP 142/88  Pulse 76  Temp(Src) 98.4 F (36.9 C) (Temporal)  Resp 16  Ht 5\' 3"  (1.6 m)  Wt 154 lb 6.4 oz (70.035 kg)  BMI 27.35 kg/m2  On physical exam today she's got a four-inch area to the left and above the umbilicus and certainly could not appreciate a similar findings when I saw R6 months earlier on reviewing the CT there was a little defect right at the navel the top part of her midline incision and this possibly could be where the hernia is originated if it is an incisional hernia. On rectal examination she is not  constipated today and the stool is Hemoccult negative  She states that she still taken the chronic pain medication is Percocet more thing for her chronic back problem and that the baby is better but certainly not resolved    Assessment:    Schedule a repeat CT of the abdomen and pelvis with oral and IV contrast to see if we can identify the etiology of this mass but probably some type of abdominal wall hernia. We will obtain the lab studies from her recent visit Dr. Tiburcio Pea at a [place] [facility]    Plan:     Return after the CT and we'll excise on what type or procedure will be necessary for repair and this probable incisional hernia

## 2011-04-08 ENCOUNTER — Inpatient Hospital Stay (HOSPITAL_COMMUNITY): Admission: RE | Admit: 2011-04-08 | Payer: Medicare Other | Source: Ambulatory Visit | Admitting: Speech Pathology

## 2011-04-09 ENCOUNTER — Inpatient Hospital Stay: Admission: RE | Admit: 2011-04-09 | Payer: Medicare Other | Source: Ambulatory Visit

## 2011-04-09 ENCOUNTER — Telehealth (HOSPITAL_COMMUNITY): Payer: Self-pay | Admitting: Speech Pathology

## 2011-04-09 ENCOUNTER — Ambulatory Visit
Admission: RE | Admit: 2011-04-09 | Discharge: 2011-04-09 | Disposition: A | Discharge status: Home / Self Care | Payer: Medicare Other | Source: Ambulatory Visit | Attending: General Surgery | Admitting: General Surgery

## 2011-04-09 DIAGNOSIS — R1900 Intra-abdominal and pelvic swelling, mass and lump, unspecified site: Secondary | ICD-10-CM

## 2011-04-09 MED ORDER — IOHEXOL 300 MG/ML  SOLN: 100.0000 mL | Freq: Once | INTRAMUSCULAR | Status: AC | PRN

## 2011-04-13 ENCOUNTER — Ambulatory Visit (HOSPITAL_COMMUNITY)
Admission: RE | Admit: 2011-04-13 | Discharge: 2011-04-13 | Disposition: A | Discharge status: Home / Self Care | Payer: Medicare Other | Source: Ambulatory Visit | Attending: Physical Medicine & Rehabilitation | Admitting: Physical Medicine & Rehabilitation

## 2011-04-13 DIAGNOSIS — M6281 Muscle weakness (generalized): Secondary | ICD-10-CM | POA: Insufficient documentation

## 2011-04-13 DIAGNOSIS — E119 Type 2 diabetes mellitus without complications: Secondary | ICD-10-CM | POA: Insufficient documentation

## 2011-04-13 DIAGNOSIS — R42 Dizziness and giddiness: Secondary | ICD-10-CM | POA: Insufficient documentation

## 2011-04-13 DIAGNOSIS — IMO0001 Reserved for inherently not codable concepts without codable children: Secondary | ICD-10-CM | POA: Insufficient documentation

## 2011-04-13 DIAGNOSIS — F0781 Postconcussional syndrome: Secondary | ICD-10-CM

## 2011-04-13 NOTE — Progress Notes (Signed)
Speech Language Pathology Treatment Patient Details  Name: NOVALEIGH KOHLMAN MRN: [id number] Date of Birth: 01-Mar-1956 Evaluation Date: 03/29/2011 Authorization Through: 04/26/2011 Visit #: 3  Today's Date: 04/13/2011 Time: 4098-1191 Time Calculation (min): 56 min  HPI:  Symptoms/Limitations Symptoms: "I'm sorry I missed last week, I have a mass in my stomach." Special Tests: N/A Pain Assessment Currently in Pain?: No/denies Multiple Pain Sites: No   Treatment  Cognitive-Linguistic Therapy Oral Motor Exercises Patient/Family Education Home Exercise Program  SLP Goals  Home Exercise SLP Goal: Patient will Perform Home Exercise Program: with supervision, verbal cues required/provided SLP Goal: Perform Home Exercise Program - Progress: Progressing toward goal SLP Short Term Goals SLP Short Term Goal 1: Pt will implement memory strategies in therapy activities with mod cues to increase recall. SLP Short Term Goal 1 - Progress: Progressing toward goal SLP Short Term Goal 2: Pt will record 3 or greater weekly appointments, reminders, to-do items in her smart phone to facilitate task completion. SLP Short Term Goal 2 - Progress: Progressing toward goal SLP Short Term Goal 3: Pt will complete selective and alternating attention tasks (moderately complex) with 90% acc and mod cues. SLP Short Term Goal 3 - Progress: Progressing toward goal SLP Short Term Goal 4: Pt will complete moderate-level thought organization and planning activities with 90% acc and mod assist. SLP Short Term Goal 4 - Progress: Progressing toward goal SLP Long Term Goals SLP Long Term Goal 1: Pt will utilize compensatory memory strategies to increase recall of functional information/activities with min assist. SLP Long Term Goal 1 - Progress: Progressing toward goal SLP Long Term Goal 2: Pt will utilize compensatory strategies for attention in dynamic environment with min assist. SLP Long Term Goal 2 - Progress:  Progressing toward goal SLP Long Term Goal 3: Pt will be independent with her personal memory assistive device (smart phone). SLP Long Term Goal 3 - Progress: Progressing toward goal SLP Long Term Goal 4: Pt and family education with be completed SLP Long Term Goal 4 - Progress: Progressing toward goal  Assessment/Plan  Patient Active Problem List  Diagnoses  . Benign paroxysmal positional vertigo  . Post concussive syndrome  . Umbilical hernia without mention of obstruction or gangrene   SLP - End of Session Activity Tolerance: Patient tolerated treatment well General Behavior During Session: WFL for tasks performed Cognition: Impaired  SLP Assessment/Plan Clinical Impression Statement: Mrs. Vangieson apologized for missing last week's appointments and revealed that she has a mass in her stomach that will be removed later this week. She was not able to add all of her appointments to her calendar due to feeling unwell. She required mi/mod cues to add this week's appointments. She was encouraged to purchase a small notebook to carry in her purse to write short reminders in as needed. She completed memory/attention exercise to recall 10 words with association cues. 1st= 3/10, 2nd=  6/10, 3rs= 5/10, 4th after cues to verbalize association aloud = 9/10, and 5th= 10/10. After completion of 5 trials, she was able to recall 6/10 words without association cues presented. She was pleased with her performance. Memory strategies were reviewed and provided to her on paper to take home. Speech Therapy Frequency: min 2x/week Duration: 4 weeks Treatment/Interventions: Environmental controls;Compensatory techniques;Cueing hierarchy;Cognitive reorganization;Functional tasks;Internal/external aids;SLP instruction and feedback;Patient/family education Potential to Achieve Goals: Good Potential Considerations: Ability to learn/carryover information  Hazelyn Kallen 04/13/2011, 2:51 PM

## 2011-04-14 ENCOUNTER — Telehealth (INDEPENDENT_AMBULATORY_CARE_PROVIDER_SITE_OTHER): Payer: Self-pay | Admitting: General Surgery

## 2011-04-15 ENCOUNTER — Telehealth (HOSPITAL_COMMUNITY): Payer: Self-pay

## 2011-04-15 ENCOUNTER — Ambulatory Visit (HOSPITAL_COMMUNITY): Payer: Medicare Other | Admitting: Speech Pathology

## 2011-04-19 ENCOUNTER — Ambulatory Visit (HOSPITAL_COMMUNITY): Payer: Medicare Other | Admitting: Physical Therapy

## 2011-04-20 ENCOUNTER — Ambulatory Visit (HOSPITAL_COMMUNITY): Payer: Medicare Other | Admitting: Speech Pathology

## 2011-04-20 ENCOUNTER — Ambulatory Visit (HOSPITAL_COMMUNITY): Payer: Medicare Other | Admitting: Physical Therapy

## 2011-04-22 ENCOUNTER — Ambulatory Visit (HOSPITAL_COMMUNITY): Payer: Medicare Other | Admitting: Speech Pathology

## 2011-04-26 ENCOUNTER — Ambulatory Visit (HOSPITAL_COMMUNITY): Payer: Medicare Other | Admitting: Speech Pathology

## 2011-04-27 ENCOUNTER — Telehealth (HOSPITAL_COMMUNITY): Payer: Self-pay

## 2011-04-27 ENCOUNTER — Inpatient Hospital Stay (HOSPITAL_COMMUNITY): Admission: RE | Admit: 2011-04-27 | Payer: Medicare Other | Source: Ambulatory Visit | Admitting: Speech Pathology

## 2011-04-29 ENCOUNTER — Ambulatory Visit (HOSPITAL_COMMUNITY)
Admission: RE | Admit: 2011-04-29 | Discharge: 2011-04-29 | Disposition: A | Discharge status: Home / Self Care | Payer: Medicare Other | Source: Ambulatory Visit | Attending: Internal Medicine | Admitting: Internal Medicine

## 2011-04-29 DIAGNOSIS — F0781 Postconcussional syndrome: Secondary | ICD-10-CM

## 2011-04-29 NOTE — Progress Notes (Signed)
Speech Language Pathology Treatment Patient Details  Name: Mallory Mendez MRN: [id number] Date of Birth: 09-29-1955 Evaluation Date: 03/29/2011 Authorization Through: 04/26/2011 Visit #: 4   Today's Date: 04/29/2011 Time: 1300-1350 Time Calculation (min): 50 min  HPI:  Symptoms/Limitations Symptoms: "I'm sorry I missed my appointments." Special Tests: N/A Pain Assessment Currently in Pain?: No/denies Pain Score: 0-No pain Multiple Pain Sites: No   Treatment  Cognitive-Linguistic Therapy Patient/Family Education Home Exercise Program  SLP Goals  Home Exercise SLP Goal: Patient will Perform Home Exercise Program: with supervision, verbal cues required/provided SLP Goal: Perform Home Exercise Program - Progress: Progressing toward goal SLP Short Term Goals SLP Short Term Goal 1: Pt will implement memory strategies in therapy activities with mod cues to increase recall. SLP Short Term Goal 1 - Progress: Progressing toward goal SLP Short Term Goal 2: Pt will record 3 or greater weekly appointments, reminders, to-do items in her smart phone to facilitate task completion. SLP Short Term Goal 2 - Progress: Progressing toward goal SLP Short Term Goal 3: Pt will complete selective and alternating attention tasks (moderately complex) with 90% acc and mod cues. SLP Short Term Goal 3 - Progress: Progressing toward goal SLP Short Term Goal 4: Pt will complete moderate-level thought organization and planning activities with 90% acc and mod assist. SLP Short Term Goal 4 - Progress: Progressing toward goal SLP Long Term Goals SLP Long Term Goal 1: Pt will utilize compensatory memory strategies to increase recall of functional information/activities with min assist. SLP Long Term Goal 1 - Progress: Progressing toward goal SLP Long Term Goal 2: Pt will utilize compensatory strategies for attention in dynamic environment with min assist. SLP Long Term Goal 2 - Progress: Progressing toward  goal SLP Long Term Goal 3: Pt will be independent with her personal memory assistive device (smart phone). SLP Long Term Goal 3 - Progress: Progressing toward goal SLP Long Term Goal 4: Pt and family education with be completed SLP Long Term Goal 4 - Progress: Progressing toward goal  Assessment/Plan  Patient Active Problem List  Diagnoses  . Benign paroxysmal positional vertigo  . Post concussive syndrome  . Umbilical hernia without mention of obstruction or gangrene   SLP - End of Session Activity Tolerance: Patient tolerated treatment well General Behavior During Session: [facility] for tasks performed Cognition: Impaired  SLP Assessment/Plan Clinical Impression Statement: Mrs. Meline was accompanied by her husband today. She apologized for missing her appointments last week due to insufficient gas funds. Her husband reports that she is having a very hard time at home- not remembering things, unable to complete tasks, and going to bed at 3 am. She is inconsistently using her phone for appointments and reminders, so I gave her 3 months of paper calendars to keep in her purse.  She has made progress toward the short term goals this month, but has not met her goals due to inconsistent attendance. Will recommend another 4 weeks of therapy to address goals. Both the patient and her husband assure me that she will be in attendance. Speech Therapy Frequency: min 2x/week Duration: 4 weeks Treatment/Interventions: Environmental controls;Compensatory techniques;Cueing hierarchy;Cognitive reorganization;Functional tasks;Internal/external aids;SLP instruction and feedback;Patient/family education Potential to Achieve Goals: Good Potential Considerations: Ability to learn/carryover information  Physician Treatment Plan  Your signature is required to indicate approval of the treatment plan/progress as stated above. Please make a copy of this report for  your files and return this physician signed original in the self-addressed envelope or fax to [phone]. COMMENTS/CHANGES:__________________________________________________________________________________________________________________________   ____________________________________                 ____________________ PHYSICIAN SIGNATURE                                                   DATE    Thank you, [name], CCC-SLP [phone]  PORTER,DABNEY 04/29/2011, 3:09 PM

## 2011-05-05 ENCOUNTER — Ambulatory Visit (INDEPENDENT_AMBULATORY_CARE_PROVIDER_SITE_OTHER): Payer: Medicare Other | Admitting: General Surgery

## 2011-05-06 ENCOUNTER — Ambulatory Visit (HOSPITAL_COMMUNITY)
Admission: RE | Admit: 2011-05-06 | Discharge: 2011-05-06 | Disposition: A | Discharge status: Home / Self Care | Payer: Medicare Other | Source: Ambulatory Visit | Attending: Physical Medicine & Rehabilitation | Admitting: Physical Medicine & Rehabilitation

## 2011-05-06 DIAGNOSIS — R4789 Other speech disturbances: Secondary | ICD-10-CM | POA: Insufficient documentation

## 2011-05-06 DIAGNOSIS — R42 Dizziness and giddiness: Secondary | ICD-10-CM | POA: Insufficient documentation

## 2011-05-06 DIAGNOSIS — F0781 Postconcussional syndrome: Secondary | ICD-10-CM

## 2011-05-06 DIAGNOSIS — E119 Type 2 diabetes mellitus without complications: Secondary | ICD-10-CM | POA: Insufficient documentation

## 2011-05-06 DIAGNOSIS — M6281 Muscle weakness (generalized): Secondary | ICD-10-CM | POA: Insufficient documentation

## 2011-05-06 DIAGNOSIS — IMO0001 Reserved for inherently not codable concepts without codable children: Secondary | ICD-10-CM | POA: Insufficient documentation

## 2011-05-06 DIAGNOSIS — H8110 Benign paroxysmal vertigo, unspecified ear: Secondary | ICD-10-CM

## 2011-05-06 NOTE — Progress Notes (Signed)
Speech Language Pathology Treatment Patient Details  Name: GLENDIA OLSHEFSKI MRN: [id number] Date of Birth: 01-15-1956  Today's Date: 05/06/2011 Time: 1300-1346 Time Calculation (min): 46 min  HPI:  Symptoms/Limitations Symptoms: Patient was alert and cooperative. She staggered a few times walking into therapy office. Special Tests: N/A Pain Assessment Currently in Pain?: No/denies Pain Score: 0-No pain   Treatment  Cognitive-Linguistic Therapy Patient/Family Education Home Exercise Program  SLP Goals  Home Exercise SLP Goal: Patient will Perform Home Exercise Program: with supervision, verbal cues required/provided SLP Goal: Perform Home Exercise Program - Progress: Progressing toward goal SLP Short Term Goals SLP Short Term Goal 1: Pt will implement memory strategies in therapy activities with mod cues to increase recall. SLP Short Term Goal 1 - Progress: Progressing toward goal SLP Short Term Goal 2: Pt will record 3 or greater weekly appointments, reminders, to-do items in her smart phone to facilitate task completion. SLP Short Term Goal 2 - Progress: Progressing toward goal SLP Short Term Goal 3: Pt will complete selective and alternating attention tasks (moderately complex) with 90% acc and mod cues. SLP Short Term Goal 3 - Progress: Progressing toward goal SLP Short Term Goal 4: Pt will complete moderate-level thought organization and planning activities with 90% acc and mod assist. SLP Short Term Goal 4 - Progress: Progressing toward goal SLP Long Term Goals SLP Long Term Goal 1: Pt will utilize compensatory memory strategies to increase recall of functional information/activities with min assist. SLP Long Term Goal 1 - Progress: Progressing toward goal SLP Long Term Goal 2: Pt will utilize compensatory strategies for attention in dynamic environment with min assist. SLP Long Term Goal 2 - Progress: Progressing toward goal SLP Long Term Goal 3: Pt will be independent  with her personal memory assistive device (smart phone). SLP Long Term Goal 3 - Progress: Progressing toward goal SLP Long Term Goal 4: Pt and family education with be completed SLP Long Term Goal 4 - Progress: Progressing toward goal  Assessment/Plan  Patient Active Problem List  Diagnoses  . Benign paroxysmal positional vertigo  . Post concussive syndrome  . Umbilical hernia without mention of obstruction or gangrene   SLP - End of Session Activity Tolerance: Patient tolerated treatment well General Behavior During Session: [facility] for tasks performed Cognition: Impaired  SLP Assessment/Plan Clinical Impression Statement: Mrs. Twining was accompanied by her mother today. She added a couple of her appointments in her phone, but not all. She was also encouraged to write at least one activity per day on her calendar given to her last session. Memory strategies were reviewed and re-emphasized the need for her to write information down and review thourought the day. She may move to [place] when her lease is out in [place]. She felt relief that her husband found a job in [place]. Speech Therapy Frequency: min 2x/week Duration: 2 weeks Treatment/Interventions: Environmental controls;Compensatory techniques;Cueing hierarchy;Cognitive reorganization;Functional tasks;Internal/external aids;SLP instruction and feedback;Patient/family education Potential to Achieve Goals: Good Potential Considerations: Ability to learn/carryover information  PORTER,DABNEY 05/06/2011, 6:17 PM

## 2011-05-06 NOTE — Progress Notes (Signed)
Physical Therapy Evaluation  Patient Details  Name: Mallory Mendez MRN: [id number] Date of Birth: 1955/08/23  Today's Date: 05/06/2011 Time: 4098-1191 Time Calculation (min): 58 min Visit#: 2  of 8   Re-eval: 06/05/11  neuro re-ed 34; manual 10  Past Medical History:  Past Medical History  Diagnosis Date  . Diabetes mellitus   . Hyperlipidemia   . Blood transfusion   . Arthritis   . History of back surgery     multiple  . Hypertension   . Hernia     incisional  . Wears glasses   . MRSA (methicillin resistant Staphylococcus aureus)   . Full dentures   . Chronic constipation    Past Surgical History:  Past Surgical History  Procedure Date  . Cholecystectomy 1984  . Spinal fusion 2006  . Hernia repair     09/21/2010  . Eye surgery 2010    bilateral  . Abdominal hysterectomy   . Cerebral aneurysm repair     Subjective Symptoms/Limitations Symptoms: Pt observed turning her head to the left with loss of balance.  Pt states that she is losing her balance whenever she turns her head to quickly or if she gets up to quickly..  She estimates that she is losing her balance 10-12 times a day.  She states that she has lost two canes already because she forgets where she leaves them.     Prior Functioning-Pt is losing her balance multiple times per day.    Objective:  No nystagmus was noted with canalith repositioning.    Exercise/Treatments Balance Exercises Tandem Walking: 1 round trip Retro Gait: 1 round trip Gait with Head Turns (Round Trips): 2 RT  cervical isometrics.  : sitting eye focusing exercises with  head turning keeping fixed on object. Eyes on object then look away then back to the object.  Two objects look back and forth to them. Balance Poses  (canalith repositioning; Start long sit head turned to L 45x2)   Physical Therapy Assessment and Plan PT Assessment and Plan Clinical Impression Statement: Pt has only been see for two times will continue two  times a week for the next three weeks to improve balance disorder  Rehab Potential: Good Clinical Impairments Affecting Rehab Potential: balance and vertigo disorder. PT Plan: Berg balance test and DGI next treatment.    Goals Home Exercise Program PT Goal: Perform Home Exercise Program - Progress: Not met PT Short Term Goals PT Short Term Goal 2 - Progress: Not met PT Short Term Goal 3 - Progress: Not met PT Short Term Goal 4 - Progress: Not met PT Short Term Goal 5 - Progress: Not met PT Long Term Goals PT Long Term Goal 1 - Progress: Not met PT Long Term Goal 2 - Progress: Not met Long Term Goal 3 Progress: Not met Long Term Goal 4 Progress: Not met  Problem List Patient Active Problem List  Diagnoses  . Benign paroxysmal positional vertigo  . Post concussive syndrome  . Umbilical hernia without mention of obstruction or gangrene    PT - End of Session Equipment Utilized During Treatment: Gait belt Activity Tolerance: Patient tolerated treatment well General Behavior During Session: [facility] for tasks performed Cognition: [facility] for tasks performed   Demarquis Osley,[place] 05/06/2011, 2:48 PM  Physician Documentation Your signature is required to indicate approval of the treatment plan as stated above.  Please sign and either send electronically or make a copy of this report for your files and return this physician signed  original.   Please mark one 1.__approve of plan  2. ___approve of plan with the following conditions.   ______________________________                                                          _____________________ Physician Signature                                                                                                             Date

## 2011-05-06 NOTE — Patient Instructions (Addendum)
HEP

## 2011-05-07 ENCOUNTER — Encounter (INDEPENDENT_AMBULATORY_CARE_PROVIDER_SITE_OTHER): Payer: Self-pay | Admitting: General Surgery

## 2011-05-07 ENCOUNTER — Ambulatory Visit (INDEPENDENT_AMBULATORY_CARE_PROVIDER_SITE_OTHER): Payer: Medicare Other | Admitting: General Surgery

## 2011-05-07 VITALS — BP 128/82 | HR 60 | Temp 98.2°F | Resp 20 | Ht 64.0 in | Wt 143.2 lb

## 2011-05-07 DIAGNOSIS — K5909 Other constipation: Secondary | ICD-10-CM

## 2011-05-07 DIAGNOSIS — K432 Incisional hernia without obstruction or gangrene: Secondary | ICD-10-CM

## 2011-05-07 DIAGNOSIS — K5900 Constipation, unspecified: Secondary | ICD-10-CM

## 2011-05-07 NOTE — Progress Notes (Signed)
Subjective:     Patient ID: Mallory Mendez, female   DOB: [birth year], 55 y.o.   MRN: [id number]  HPICindy Hahne returns accompanied by her friend and we reviewed the CT that was done after her last visit that showed extreme constipation and all areas of her colon and she states that she has been able to decrease the p.o. morphine and fentanyl etc. that she is on and that she is having bowel movements female and a better. I would recommend that we try to put her on MiraLax b.i.d. I do think we need to get a full column barium enema to make sure there is no evidence of any definite problem with her colon in spite of her being in stool Hemoccult negative and then plan on repairing this incisional hernia that's in the top part of her lower midline incision plaque I repaired the lower one for 5 months ago. She's had attempts at a laparoscopic repair by a physician in refill earlier and I would not recommend that this be attempted laparoscopically. Fortunately the fascia defect is not that large in its all round and navel and we'll do this at [facility] after next   Review of Systems Current Outpatient Prescriptions  Medication Sig Dispense Refill  . ALPRAZolam (XANAX) 1 MG tablet Take 1 mg by mouth at bedtime as needed.        Marland Kitchen atorvastatin (LIPITOR) 40 MG tablet Take 40 mg by mouth daily.        Marland Kitchen desvenlafaxine (PRISTIQ) 100 MG 24 hr tablet Take 100 mg by mouth daily.        Marland Kitchen esomeprazole (NEXIUM) 40 MG capsule Take 40 mg by mouth daily before breakfast.        . Estradiol-Norgestimate (PREFEST) 1/1-0.09 MG (15/15) TABS Take by mouth.        . gabapentin (NEURONTIN) 800 MG tablet Take 800 mg by mouth 3 (three) times daily.        Marland Kitchen imipramine (TOFRANIL) 50 MG tablet Take 50 mg by mouth at bedtime.        . insulin glargine (LANTUS) 100 UNIT/ML injection Inject 100 Units into the skin at bedtime.        . insulin lispro (HUMALOG) 100 UNIT/ML injection Inject 100 Units into the skin 3 (three)  times daily before meals.        Marland Kitchen lisinopril (PRINIVIL,ZESTRIL) 20 MG tablet Take 20 mg by mouth daily.        Marland Kitchen lubiprostone (AMITIZA) 24 MCG capsule Take 24 mcg by mouth 2 (two) times daily with a meal.        . methylphenidate (RITALIN) 10 MG tablet Take 10 mg by mouth 2 (two) times daily.        . minocycline (DYNACIN) 100 MG tablet Take 100 mg by mouth 2 (two) times daily.        Marland Kitchen morphine (MSIR) 30 MG tablet Take 30 mg by mouth every 4 (four) hours as needed.        . ondansetron (ZOFRAN) 4 MG tablet Take 4 mg by mouth every 8 (eight) hours as needed.        Marland Kitchen oxyCODONE-acetaminophen (PERCOCET) 10-325 MG per tablet Take 1 tablet by mouth every 4 (four) hours as needed.        . topiramate (TOPAMAX) 100 MG tablet daily.      Marland Kitchen torsemide (DEMADEX) 20 MG tablet Take 20 mg by mouth daily.        Marland Kitchen  zolpidem (AMBIEN) 10 MG tablet Take 10 mg by mouth at bedtime as needed.         Allergies  Allergen Reactions  . Cymbalta (Duloxetine Hcl) Anaphylaxis  . Latex    Past Surgical History  Procedure Date  . Cholecystectomy 1984  . Spinal fusion 2006  . Hernia repair     09/21/2010  . Eye surgery 2010    bilateral  . Abdominal hysterectomy   . Cerebral aneurysm repair          Objective:   Physical ExamBP 128/82  Pulse 60  Temp(Src) 98.2 F (36.8 C) (Temporal)  Resp 20  Ht 5\' 4"  (1.626 m)  Wt 143 lb 4 oz (64.978 kg)  BMI 24.59 kg/m2 Returns and she appears less sedated today than when I seen her previously in our abdominal exam she is not as bloated and a hernia that was a prominent last time its palpable ports she's listen to her abdomen she has no acute cramping bloating symptom and all rectal examination was a moderate amount of soft stool and I would recommend that we put her on a full liquid diet MiraLax b.i.d. get a barium enema next week and plan on doing her hernia repair at [facility] we week after next. She is trying to decrease her pain medications are being prescribed  by her pain Dr. and she is aware that the constipation is then precipitated probably from the chronic narcotic use. The eyes ears nose and throat normal lungs clear cardiac normal sinus rhythm and did not do a breast exam midline incision appears to be intact there is a hernia to the left of the navel and she's got a big right subcostal incision from her previous open cholecystectomy. Rectal examination stool Hemoccult negative soft brown stool in the rectum     Assessment:     Plan barium enema full column as an outpatient and it repair the hernia at [facility] she'll to schedule her at this time. She will probably be in the hospital about 2 nights hopefully   Plan:    Recurrent incisional hernia lower midline incision and a history of chronic constipation secondary to chronic narcotic use

## 2011-05-10 ENCOUNTER — Encounter: Payer: Medicare Other | Attending: Physical Medicine & Rehabilitation | Admitting: Physical Medicine & Rehabilitation

## 2011-05-10 ENCOUNTER — Other Ambulatory Visit (INDEPENDENT_AMBULATORY_CARE_PROVIDER_SITE_OTHER): Payer: Self-pay | Admitting: General Surgery

## 2011-05-10 DIAGNOSIS — S069XAA Unspecified intracranial injury with loss of consciousness status unknown, initial encounter: Secondary | ICD-10-CM

## 2011-05-10 DIAGNOSIS — M545 Low back pain, unspecified: Secondary | ICD-10-CM

## 2011-05-10 DIAGNOSIS — F0781 Postconcussional syndrome: Secondary | ICD-10-CM

## 2011-05-10 DIAGNOSIS — R413 Other amnesia: Secondary | ICD-10-CM | POA: Insufficient documentation

## 2011-05-10 DIAGNOSIS — F3289 Other specified depressive episodes: Secondary | ICD-10-CM | POA: Insufficient documentation

## 2011-05-10 DIAGNOSIS — R41840 Attention and concentration deficit: Secondary | ICD-10-CM | POA: Insufficient documentation

## 2011-05-10 DIAGNOSIS — F341 Dysthymic disorder: Secondary | ICD-10-CM

## 2011-05-10 DIAGNOSIS — R51 Headache: Secondary | ICD-10-CM | POA: Insufficient documentation

## 2011-05-10 DIAGNOSIS — G479 Sleep disorder, unspecified: Secondary | ICD-10-CM | POA: Insufficient documentation

## 2011-05-10 DIAGNOSIS — S069X9A Unspecified intracranial injury with loss of consciousness of unspecified duration, initial encounter: Secondary | ICD-10-CM

## 2011-05-10 DIAGNOSIS — G8929 Other chronic pain: Secondary | ICD-10-CM | POA: Insufficient documentation

## 2011-05-10 DIAGNOSIS — R42 Dizziness and giddiness: Secondary | ICD-10-CM | POA: Insufficient documentation

## 2011-05-10 DIAGNOSIS — M549 Dorsalgia, unspecified: Secondary | ICD-10-CM | POA: Insufficient documentation

## 2011-05-10 DIAGNOSIS — F411 Generalized anxiety disorder: Secondary | ICD-10-CM | POA: Insufficient documentation

## 2011-05-10 DIAGNOSIS — F329 Major depressive disorder, single episode, unspecified: Secondary | ICD-10-CM | POA: Insufficient documentation

## 2011-05-10 NOTE — Assessment & Plan Note (Signed)
HISTORY:  Mallory Mendez is back regarding her post concussion syndrome.  She continues to struggle.  She just started therapy and only has had used 2 sessions thus far.  She struggles with balance and coordination.  Sleep is poor.  According to her significant other, on her home remains chaotic with family and now presenting on their own problems.  She has hard time sleeping because of pain, but also it is noted that the family is often stimulating her at night, calling her, coming into the home, etc.  She is afraid to do her vestibular exercises.  Ritalin seems to help with her attention until she ran out of a week or 2 ago.Marland Kitchen  REVIEW OF SYSTEMS:  Notable for multiple items.  Full review is in the written health and history section of the chart.  SOCIAL HISTORY:  Noted above.  Husband is with her today.  The patient still smokes regularly.  PHYSICAL EXAMINATION:  VITAL SIGNS:  Blood pressure 125/49, pulse 89, respiratory rate 16, she is satting 99% on room air. GENERAL:  The patient has continued balance issues and becomes dizzy with ambulation.  She moves and wanders to the right frequently.  She has notable nystagmus in all fields.  Strength 5/5.  Attention rates poor.  She is less anxious overall today.  Strength 5/5.  Normal sensory function. HEART:  Regular. CHEST:  Clear. ABDOMEN:  Soft and nontender.  ASSESSMENT: 1. Post concussion syndrome with positional vertigo, attention     deficit, memory and sleep dysfunction, anxiety and depression. 2. Chronic back pain. 3. Headache acute on chronic.  PLAN: 1. We will increase Ritalin to 15 mg at 7:00 a.m. and 12 noon daily. 2. We will add Klonopin for sleep 0.5 mg at bedtime to help with     anxiety as well.  I wrote the patient a "prescription" outlining     that the fact that I want her to be in bed by 10 or 10:30 p.m.  I     want no interruptions at night.  Ultimately up to the patient and     her family to work on things to decrease  the stress and shuttle     around her home. 3. We will continue with therapy.  She may be moving to [place], so     we will try to transition her perhaps down to [facility]. 4. The patient asked about me taking over her pain medication which I     would consider.  She is on MS Contin and Percocet currently. 5. Pristiq in the morning 100 mg q.a.m. 6. I will see her back here in about a month.  I spoke at length with     the patient and her husband today.     Ranelle Oyster, M.D. Electronically Signed    ZTS/MedQ D:  05/10/2011 13:57:28  T:  05/10/2011 14:55:08  Job #:  [id number]

## 2011-05-11 ENCOUNTER — Ambulatory Visit (HOSPITAL_COMMUNITY): Payer: Medicare Other | Admitting: Speech Pathology

## 2011-05-12 ENCOUNTER — Encounter (HOSPITAL_COMMUNITY): Payer: Self-pay | Admitting: Pharmacy Technician

## 2011-05-12 NOTE — Progress Notes (Signed)
Addended by: Iona Coach on: 05/12/2011 06:26 PM   Modules accepted: Orders

## 2011-05-12 NOTE — H&P (Signed)
Patient ID: Mallory Mendez, female DOB: 1955-07-09, 55 y.o. MRN: [id number]  HPICindy Mendez returns accompanied by her friend and we reviewed the CT that was done after her last visit that showed extreme constipation and all areas of her colon and she states that she has been able to decrease the p.o. morphine and fentanyl etc. that she is on and that she is having bowel movements female and a better. I would recommend that we try to put her on MiraLax b.i.d. I do think we need to get a full column barium enema to make sure there is no evidence of any definite problem with her colon in spite of her being in stool Hemoccult negative and then plan on repairing this incisional hernia that's in the top part of her lower midline incision plaque I repaired the lower one for 5 months ago. She's had attempts at a laparoscopic repair by a physician in refill earlier and I would not recommend that this be attempted laparoscopically. Fortunately the fascia defect is not that large in its all round and navel and we'll do this at [facility] after next  Review of Systems  Current Outpatient Prescriptions   Medication  Sig  Dispense  Refill   .  ALPRAZolam (XANAX) 1 MG tablet  Take 1 mg by mouth at bedtime as needed.     Marland Kitchen  atorvastatin (LIPITOR) 40 MG tablet  Take 40 mg by mouth daily.     Marland Kitchen  desvenlafaxine (PRISTIQ) 100 MG 24 hr tablet  Take 100 mg by mouth daily.     Marland Kitchen  esomeprazole (NEXIUM) 40 MG capsule  Take 40 mg by mouth daily before breakfast.     .  Estradiol-Norgestimate (PREFEST) 1/1-0.09 MG (15/15) TABS  Take by mouth.     .  gabapentin (NEURONTIN) 800 MG tablet  Take 800 mg by mouth 3 (three) times daily.     Marland Kitchen  imipramine (TOFRANIL) 50 MG tablet  Take 50 mg by mouth at bedtime.     .  insulin glargine (LANTUS) 100 UNIT/ML injection  Inject 100 Units into the skin at bedtime.     .  insulin lispro (HUMALOG) 100 UNIT/ML injection  Inject 100 Units into the skin 3 (three) times daily before meals.      Marland Kitchen  lisinopril (PRINIVIL,ZESTRIL) 20 MG tablet  Take 20 mg by mouth daily.     Marland Kitchen  lubiprostone (AMITIZA) 24 MCG capsule  Take 24 mcg by mouth 2 (two) times daily with a meal.     .  methylphenidate (RITALIN) 10 MG tablet  Take 10 mg by mouth 2 (two) times daily.     .  minocycline (DYNACIN) 100 MG tablet  Take 100 mg by mouth 2 (two) times daily.     Marland Kitchen  morphine (MSIR) 30 MG tablet  Take 30 mg by mouth every 4 (four) hours as needed.     .  ondansetron (ZOFRAN) 4 MG tablet  Take 4 mg by mouth every 8 (eight) hours as needed.     Marland Kitchen  oxyCODONE-acetaminophen (PERCOCET) 10-325 MG per tablet  Take 1 tablet by mouth every 4 (four) hours as needed.     .  topiramate (TOPAMAX) 100 MG tablet  daily.     Marland Kitchen  torsemide (DEMADEX) 20 MG tablet  Take 20 mg by mouth daily.     Marland Kitchen  zolpidem (AMBIEN) 10 MG tablet  Take 10 mg by mouth at bedtime as needed.  Allergies   Allergen  Reactions   .  Cymbalta (Duloxetine Hcl)  Anaphylaxis   .  Latex     Past Surgical History   Procedure  Date   .  Cholecystectomy  1984   .  Spinal fusion  2006   .  Hernia repair      09/21/2010   .  Eye surgery  2010     bilateral   .  Abdominal hysterectomy    .  Cerebral aneurysm repair      Objective:   Physical ExamBP 128/82  Pulse 60  Temp(Src) 98.2 F (36.8 C) (Temporal)  Resp 20  Ht 5\' 4"  (1.626 m)  Wt 143 lb 4 oz (64.978 kg)  BMI 24.59 kg/m2  Returns and she appears less sedated today than when I seen her previously in our abdominal exam she is not as bloated and a hernia that was a prominent last time its palpable ports she's listen to her abdomen she has no acute cramping bloating symptom and all rectal examination was a moderate amount of soft stool and I would recommend that we put her on a full liquid diet MiraLax b.i.d. get a barium enema next week and plan on doing her hernia repair at [facility] we week after next. She is trying to decrease her pain medications are being prescribed by her pain Dr. and  she is aware that the constipation is then precipitated probably from the chronic narcotic use. The eyes ears nose and throat normal lungs clear cardiac normal sinus rhythm and did not do a breast exam midline incision appears to be intact there is a hernia to the left of the navel and she's got a big right subcostal incision from her previous open cholecystectomy. Rectal examination stool Hemoccult negative soft brown stool in the rectum   Assessment:    Plan barium enema full column as an outpatient and it repair the hernia at [facility] she'll to schedule her at this time. She will probably be in the hospital about 2 nights hopefully  Plan:   Recurrent incisional hernia lower midline incision and a history of chronic constipation secondary to chronic narcotic use

## 2011-05-13 ENCOUNTER — Ambulatory Visit (HOSPITAL_COMMUNITY)
Admission: RE | Admit: 2011-05-13 | Discharge: 2011-05-13 | Disposition: A | Discharge status: Home / Self Care | Payer: Medicare Other | Source: Ambulatory Visit | Attending: General Surgery | Admitting: General Surgery

## 2011-05-13 ENCOUNTER — Ambulatory Visit (HOSPITAL_COMMUNITY): Payer: Medicare Other | Admitting: Speech Pathology

## 2011-05-13 DIAGNOSIS — K439 Ventral hernia without obstruction or gangrene: Secondary | ICD-10-CM | POA: Insufficient documentation

## 2011-05-13 DIAGNOSIS — K5909 Other constipation: Secondary | ICD-10-CM

## 2011-05-13 DIAGNOSIS — K5900 Constipation, unspecified: Secondary | ICD-10-CM | POA: Insufficient documentation

## 2011-05-13 DIAGNOSIS — K432 Incisional hernia without obstruction or gangrene: Secondary | ICD-10-CM

## 2011-05-13 DIAGNOSIS — Z9089 Acquired absence of other organs: Secondary | ICD-10-CM | POA: Insufficient documentation

## 2011-05-14 ENCOUNTER — Encounter (HOSPITAL_COMMUNITY): Payer: Self-pay

## 2011-05-14 ENCOUNTER — Encounter (HOSPITAL_COMMUNITY): Payer: Medicare Other

## 2011-05-14 LAB — COMPREHENSIVE METABOLIC PANEL
BUN: 9 mg/dL (ref 6–23)
CO2: 27 mEq/L (ref 19–32)
Chloride: 102 mEq/L (ref 96–112)
Creatinine, Ser: 0.65 mg/dL (ref 0.50–1.10)
GFR calc non Af Amer: >90 mL/min (ref >90)
Glucose, Bld: 93 mg/dL (ref 70–99)
Total Bilirubin: 0.2 mg/dL — ABNORMAL LOW (ref 0.3–1.2)

## 2011-05-14 LAB — CBC
HCT: 37.5 % (ref 36.0–46.0)
Hemoglobin: 12.9 g/dL (ref 12.0–15.0)
MCV: 83.1 fL (ref 78.0–100.0)
RBC: 4.51 MIL/uL (ref 3.87–5.11)
WBC: 5.9 10*3/uL (ref 4.0–10.5)

## 2011-05-14 LAB — SURGICAL PCR SCREEN: Staphylococcus aureus: NEGATIVE

## 2011-05-14 LAB — DIFFERENTIAL
Basophils Absolute: 0 10*3/uL (ref 0.0–0.1)
Basophils Relative: 0 % (ref 0–1)
Neutro Abs: 3 10*3/uL (ref 1.7–7.7)
Neutrophils Relative %: 51 % (ref 43–77)

## 2011-05-14 NOTE — Patient Instructions (Addendum)
20 Mallory Mendez  05/14/2011   Your procedure is scheduled on: 05/19/11  Report to [facility] at 6:30 AM.  Call this number if you have problems the morning of surgery: [phone]   Remember:   Do not eat food:After Midnight.  Do not drink clear liquids: After Midnight.  Take these medicines the morning of surgery with A SIP OF WATER: ALPRAZOLAM / Derwood Kaplan Daleen Snook / MORPHINE  - TAKE 1/2 DOSE OF LANTUS INSULING THE NIGHT BEFOR SURGERY    Do not wear jewelry, make-up or nail polish.  Do not wear lotions, powders, or perfumes.   Do not shave 48 hours prior to surgery.  Do not bring valuables to the hospital.  Contacts, dentures or bridgework may not be worn into surgery.  Leave suitcase in the car. After surgery it may be brought to your room.  For patients admitted to the hospital, checkout time is 11:00 AM the day of discharge.   Patients discharged the day of surgery will not be allowed to drive home.  Name and phone number of your driver:  Special Instructions: CHG Shower Use Special Wash: 1/2 bottle night before surgery and 1/2 bottle morning of surgery.   Please read over the following fact sheets that you were given: MRSA Information

## 2011-05-18 ENCOUNTER — Ambulatory Visit (HOSPITAL_COMMUNITY): Payer: Medicare Other | Admitting: Speech Pathology

## 2011-05-19 ENCOUNTER — Encounter (HOSPITAL_COMMUNITY): Payer: Self-pay | Admitting: Certified Registered Nurse Anesthetist

## 2011-05-19 ENCOUNTER — Other Ambulatory Visit (INDEPENDENT_AMBULATORY_CARE_PROVIDER_SITE_OTHER): Payer: Self-pay | Admitting: General Surgery

## 2011-05-19 ENCOUNTER — Encounter (HOSPITAL_COMMUNITY)
Admission: RE | Disposition: A | Discharge status: Home / Self Care | Payer: Self-pay | Source: Ambulatory Visit | Attending: General Surgery

## 2011-05-19 ENCOUNTER — Observation Stay (HOSPITAL_COMMUNITY)
Admission: RE | Admit: 2011-05-19 | Discharge: 2011-05-20 | Disposition: A | Discharge status: Home / Self Care | Payer: Medicare Other | Source: Ambulatory Visit | Attending: General Surgery | Admitting: General Surgery

## 2011-05-19 ENCOUNTER — Encounter (HOSPITAL_COMMUNITY): Payer: Self-pay | Admitting: *Deleted

## 2011-05-19 ENCOUNTER — Ambulatory Visit (HOSPITAL_COMMUNITY): Payer: Medicare Other | Admitting: Certified Registered Nurse Anesthetist

## 2011-05-19 DIAGNOSIS — Z79899 Other long term (current) drug therapy: Secondary | ICD-10-CM | POA: Insufficient documentation

## 2011-05-19 DIAGNOSIS — Z01812 Encounter for preprocedural laboratory examination: Secondary | ICD-10-CM | POA: Insufficient documentation

## 2011-05-19 DIAGNOSIS — K5909 Other constipation: Secondary | ICD-10-CM

## 2011-05-19 DIAGNOSIS — K432 Incisional hernia without obstruction or gangrene: Principal | ICD-10-CM | POA: Insufficient documentation

## 2011-05-19 HISTORY — PX: INCISIONAL HERNIA REPAIR: SHX193

## 2011-05-19 HISTORY — PX: HERNIA REPAIR: SHX51

## 2011-05-19 LAB — GLUCOSE, CAPILLARY
Glucose-Capillary: 135 mg/dL — ABNORMAL HIGH (ref 70–99)
Glucose-Capillary: 88 mg/dL (ref 70–99)
Glucose-Capillary: 89 mg/dL (ref 70–99)

## 2011-05-19 SURGERY — REPAIR, HERNIA, INCISIONAL
Anesthesia: General | Wound class: Clean

## 2011-05-19 MED ORDER — ONDANSETRON HCL 4 MG/2ML IJ SOLN
INTRAMUSCULAR | Status: DC | PRN
  Administered 2011-05-19: 4 mg via INTRAVENOUS

## 2011-05-19 MED ORDER — PROMETHAZINE HCL 25 MG/ML IJ SOLN: 6.2500 mg | INTRAMUSCULAR | Status: DC | PRN

## 2011-05-19 MED ORDER — KCL IN DEXTROSE-NACL 20-5-0.45 MEQ/L-%-% IV SOLN
INTRAVENOUS | Status: DC
  Administered 2011-05-20: 13:00:00 via INTRAVENOUS
  Filled 2011-05-19 (×3): qty 1000

## 2011-05-19 MED ORDER — KETOROLAC TROMETHAMINE 30 MG/ML IJ SOLN
30.0000 mg | Freq: Once | INTRAMUSCULAR | Status: AC
  Administered 2011-05-19: 30 mg via INTRAVENOUS
  Filled 2011-05-19: qty 1

## 2011-05-19 MED ORDER — FENTANYL CITRATE 0.05 MG/ML IJ SOLN
INTRAMUSCULAR | Status: DC | PRN
  Administered 2011-05-19 (×7): 50 ug via INTRAVENOUS

## 2011-05-19 MED ORDER — ACETAMINOPHEN 325 MG PO TABS: 650.0000 mg | ORAL_TABLET | ORAL | Status: DC | PRN

## 2011-05-19 MED ORDER — INSULIN ASPART 100 UNIT/ML ~~LOC~~ SOLN
0.0000 [IU] | SUBCUTANEOUS | Status: DC
  Administered 2011-05-19 – 2011-05-20 (×3): 2 [IU] via SUBCUTANEOUS
  Filled 2011-05-19: qty 3

## 2011-05-19 MED ORDER — HYDROMORPHONE HCL PF 1 MG/ML IJ SOLN
0.2500 mg | INTRAMUSCULAR | Status: DC | PRN
  Administered 2011-05-19 (×4): 0.25 mg via INTRAVENOUS

## 2011-05-19 MED ORDER — ROCURONIUM BROMIDE 100 MG/10ML IV SOLN
INTRAVENOUS | Status: DC | PRN
  Administered 2011-05-19: 5 mg via INTRAVENOUS
  Administered 2011-05-19: 10 mg via INTRAVENOUS
  Administered 2011-05-19: 40 mg via INTRAVENOUS

## 2011-05-19 MED ORDER — SODIUM CHLORIDE 0.9 % IR SOLN
Status: DC | PRN
  Administered 2011-05-19: 1000 mL

## 2011-05-19 MED ORDER — LIDOCAINE HCL (CARDIAC) 20 MG/ML IV SOLN
INTRAVENOUS | Status: DC | PRN
  Administered 2011-05-19: 80 mg via INTRAVENOUS

## 2011-05-19 MED ORDER — KETAMINE HCL 50 MG/ML IJ SOLN
INTRAMUSCULAR | Status: DC | PRN
  Administered 2011-05-19: 50 mg via INTRAMUSCULAR

## 2011-05-19 MED ORDER — LACTATED RINGERS IV SOLN: INTRAVENOUS | Status: DC

## 2011-05-19 MED ORDER — ACETAMINOPHEN 650 MG RE SUPP
650.0000 mg | RECTAL | Status: DC | PRN
  Filled 2011-05-19: qty 1

## 2011-05-19 MED ORDER — MIDAZOLAM HCL 5 MG/5ML IJ SOLN
INTRAMUSCULAR | Status: DC | PRN
  Administered 2011-05-19: 2 mg via INTRAVENOUS

## 2011-05-19 MED ORDER — MORPHINE SULFATE 10 MG/ML IJ SOLN
2.0000 mg | INTRAMUSCULAR | Status: DC | PRN
  Administered 2011-05-19 (×2): 2 mg via INTRAVENOUS
  Filled 2011-05-19 (×2): qty 1

## 2011-05-19 MED ORDER — ACETAMINOPHEN 10 MG/ML IV SOLN
INTRAVENOUS | Status: DC | PRN
  Administered 2011-05-19: 1000 mg via INTRAVENOUS

## 2011-05-19 MED ORDER — BUPIVACAINE HCL 0.5 % IJ SOLN
INTRAMUSCULAR | Status: DC | PRN
  Administered 2011-05-19: 30 mL

## 2011-05-19 MED ORDER — CEFAZOLIN SODIUM 1-5 GM-% IV SOLN
1.0000 g | INTRAVENOUS | Status: AC
  Administered 2011-05-19: 1 g via INTRAVENOUS

## 2011-05-19 MED ORDER — PROMETHAZINE HCL 25 MG/ML IJ SOLN: 12.5000 mg | Freq: Four times a day (QID) | INTRAMUSCULAR | Status: DC | PRN

## 2011-05-19 MED ORDER — MORPHINE SULFATE 2 MG/ML IJ SOLN
2.0000 mg | INTRAMUSCULAR | Status: DC | PRN
  Administered 2011-05-20 (×2): 2 mg via INTRAVENOUS
  Filled 2011-05-19 (×2): qty 1

## 2011-05-19 MED ORDER — ONDANSETRON HCL 4 MG/2ML IJ SOLN: 4.0000 mg | Freq: Four times a day (QID) | INTRAMUSCULAR | Status: DC | PRN

## 2011-05-19 MED ORDER — PROPOFOL 10 MG/ML IV EMUL
INTRAVENOUS | Status: DC | PRN
  Administered 2011-05-19: 200 mg via INTRAVENOUS

## 2011-05-19 MED ORDER — GLYCOPYRROLATE 0.2 MG/ML IJ SOLN
INTRAMUSCULAR | Status: DC | PRN
  Administered 2011-05-19: .7 mg via INTRAVENOUS

## 2011-05-19 MED ORDER — FENTANYL CITRATE 0.05 MG/ML IJ SOLN
50.0000 ug | INTRAMUSCULAR | Status: DC | PRN
  Administered 2011-05-19: 50 ug via INTRAVENOUS

## 2011-05-19 MED ORDER — LACTATED RINGERS IV SOLN
INTRAVENOUS | Status: DC | PRN
  Administered 2011-05-19 (×2): via INTRAVENOUS

## 2011-05-19 MED ORDER — NEOSTIGMINE METHYLSULFATE 1 MG/ML IJ SOLN
INTRAMUSCULAR | Status: DC | PRN
  Administered 2011-05-19: 5 mg via INTRAVENOUS

## 2011-05-19 MED ORDER — MEPERIDINE HCL 25 MG/ML IJ SOLN: 6.2500 mg | INTRAMUSCULAR | Status: DC | PRN

## 2011-05-19 SURGICAL SUPPLY — 37 items
BINDER ABD UNIV 12 45-62 (WOUND CARE) IMPLANT
BINDER ABDOMINAL 46IN 62IN (WOUND CARE)
BLADE HEX COATED 2.75 (ELECTRODE) ×2 IMPLANT
CANISTER SUCTION 2500CC (MISCELLANEOUS) ×2 IMPLANT
CLOTH BEACON ORANGE TIMEOUT ST (SAFETY) ×2 IMPLANT
DECANTER SPIKE VIAL GLASS SM (MISCELLANEOUS) IMPLANT
DISSECTOR ROUND CHERRY 3/8 STR (MISCELLANEOUS) ×1 IMPLANT
DRAIN CHANNEL 10F 3/8 F FF (DRAIN) IMPLANT
DRAIN CHANNEL RND F F (WOUND CARE) IMPLANT
DRAPE LAPAROSCOPIC ABDOMINAL (DRAPES) ×2 IMPLANT
ELECT REM PT RETURN 9FT ADLT (ELECTROSURGICAL) ×2
ELECTRODE REM PT RTRN 9FT ADLT (ELECTROSURGICAL) ×1 IMPLANT
EVACUATOR SILICONE 100CC (DRAIN) IMPLANT
GLOVE BIOGEL PI IND STRL 7.0 (GLOVE) ×1 IMPLANT
GLOVE BIOGEL PI INDICATOR 7.0 (GLOVE) ×1
GLOVE ORTHO TXT STRL SZ7.5 (GLOVE) ×3 IMPLANT
GOWN PREVENTION PLUS XLARGE (GOWN DISPOSABLE) ×2 IMPLANT
GOWN STRL NON-REIN LRG LVL3 (GOWN DISPOSABLE) ×3 IMPLANT
GOWN STRL REIN XL XLG (GOWN DISPOSABLE) ×1 IMPLANT
KIT BASIN OR (CUSTOM PROCEDURE TRAY) ×2 IMPLANT
NEEDLE HYPO 22GX1.5 SAFETY (NEEDLE) IMPLANT
NS IRRIG 1000ML POUR BTL (IV SOLUTION) ×2 IMPLANT
PACK GENERAL/GYN (CUSTOM PROCEDURE TRAY) ×2 IMPLANT
PATCH VENTRAL MEDIUM 6.4 (Mesh Specialty) ×1 IMPLANT
SPONGE GAUZE 4X4 12PLY (GAUZE/BANDAGES/DRESSINGS) ×2 IMPLANT
STAPLER VISISTAT 35W (STAPLE) ×2 IMPLANT
SUT ETHILON 3 0 PS 1 (SUTURE) IMPLANT
SUT PDS AB 1 CTX 36 (SUTURE) IMPLANT
SUT PROLENE 0 CT 1 CR/8 (SUTURE) ×2 IMPLANT
SUT SURGILON 0 BLK (SUTURE) ×1 IMPLANT
SUT VIC AB 2-0 SH 18 (SUTURE) ×2 IMPLANT
SUT VIC AB 3-0 54XBRD REEL (SUTURE) IMPLANT
SUT VIC AB 3-0 BRD 54 (SUTURE) ×4
SUT VIC AB 4-0 SH 18 (SUTURE) IMPLANT
SYR CONTROL 10ML LL (SYRINGE) IMPLANT
TAPE CLOTH SURG 4X10 WHT LF (GAUZE/BANDAGES/DRESSINGS) ×1 IMPLANT
TRAY FOLEY CATH 14FRSI W/METER (CATHETERS) IMPLANT

## 2011-05-19 NOTE — Op Note (Signed)
Umbilical Herniorrhaphy Procedure Note  Indications: Symptomatic incisional & umbilical hernia  Pre-operative Diagnosis:Incisional Recurranrt incisional umbilical hernia  Post-operative Diagnosis: Recurrant  incisionalumbilical hernia  Surgeon: Iona Coach   Assistants: nurse  Anesthesia: General endotracheal anesthesia  ASA Class: 1 The patient has had multiple gyn & back procedures through a lower midline incisional and tree  four previous hernia repairs with mesh. I did last repair of a very low incisional hernia which had caused partial obstruction. She now has a 3-4 cm defect to the left of the navel. Plan open repair with mesh. Procedure Details  The patient was seen in the Holding Room. The risks, benefits, complications, treatment options, and expected outcomes were discussed with the patient. The possibilities of reaction to medication, pulmonary aspiration, perforation of viscus, bleeding, recurrent infection, the need for additional procedures, failure to diagnose a condition, and creating a complication requiring transfusion or operation were discussed with the patient. The patient concurred with the proposed plan, giving informed consent.  The site of surgery properly noted/marked. The patient was taken to Operating Room # 6, identified as Mallory Mendez and the procedure verified as Incisional Herniorrhaphy. A Time Out was held and the above information confirmed.  The patient was placed supine.  After establishing general anesthesia, the abdomen was prepped and draped in standard fashion.    A vertical midline incision was created.  Dissection was carried down to the hernia sac located above the fascia and was mobilized from surrounding structures.  Intact fascia was identified circumferentially around the defect. The omentum was separated from the hernia sack and previously placed mesh at midline fascia.A medium size ventral patch was sutured with 7 interpreted  0 prolene  sutures  And 0 Surgilene sutures closed the fascia over the mesh.This was closed with a 2-0 Vicryl. The soft tissue was irrigated and closed in layers.  Hemostasis was confirmed.  The skin incision was closed in layers with a Staples closed the skin. Instrument, sponge, and needle counts were correct prior to closure and at the conclusion of the case.       Estimated Blood Loss:  Minimal         Drains: none        Total IV Fluids: ml         Specimens: hernia sac         Implants: mesh         Complications:  None; patient tolerated the procedure well.         Disposition: PACU - hemodynamically stable.         Condition: stable

## 2011-05-19 NOTE — H&P (Signed)
Barium enema last week normal colon passed barium. No change on exam this am labs reveried ekg chest xray on chart BP 124/75  Pulse 87  Temp 98.5 F (36.9 C)  Resp 18  SpO2 100% Patient reexamined permit signed

## 2011-05-19 NOTE — Preoperative (Signed)
Beta Blockers   Reason not to administer Beta Blockers:Not Applicable 

## 2011-05-19 NOTE — Anesthesia Preprocedure Evaluation (Addendum)
Anesthesia Evaluation  Patient identified by MRN, date of birth, ID band Patient awake    Reviewed: Allergy & Precautions, H&P , NPO status , Patient's Chart, lab work & pertinent test results  Airway Mallampati: II TM Distance: >3 FB Neck ROM: Full    Dental No notable dental hx. (+) Edentulous Upper   Pulmonary neg pulmonary ROS,  clear to auscultation  Pulmonary exam normal       Cardiovascular hypertension, Pt. on medications neg cardio ROS Regular Normal    Neuro/Psych  Headaches, PSYCHIATRIC DISORDERS Anxiety Depression  Neuromuscular disease Negative Neurological ROS  Negative Psych ROS   GI/Hepatic negative GI ROS, Neg liver ROS, PUD, GERD-  ,  Endo/Other  Negative Endocrine ROSDiabetes mellitus-, Insulin Dependent  Renal/GU negative Renal ROS  Genitourinary negative   Musculoskeletal negative musculoskeletal ROS (+) Fibromyalgia -, narcotic dependent  Abdominal   Peds negative pediatric ROS (+)  Hematology negative hematology ROS (+)   Anesthesia Other Findings   Reproductive/Obstetrics negative OB ROS                          Anesthesia Physical Anesthesia Plan  ASA: III  Anesthesia Plan: General   Post-op Pain Management:    Induction: Intravenous  Airway Management Planned: Oral ETT  Additional Equipment:   Intra-op Plan:   Post-operative Plan: Extubation in OR  Informed Consent: I have reviewed the patients History and Physical, chart, labs and discussed the procedure including the risks, benefits and alternatives for the proposed anesthesia with the patient or authorized representative who has indicated his/her understanding and acceptance.   Dental advisory given  Plan Discussed with: CRNA  Anesthesia Plan Comments:        Anesthesia Quick Evaluation

## 2011-05-19 NOTE — Anesthesia Postprocedure Evaluation (Signed)
  Anesthesia Post-op Note  Patient: Mallory Mendez  Procedure(s) Performed:  HERNIA REPAIR INCISIONAL - repair incisional hernia with mesh  Patient Location: PACU  Anesthesia Type: General  Level of Consciousness: awake and alert   Airway and Oxygen Therapy: Patient Spontanous Breathing  Post-op Pain: mild  Post-op Assessment: Post-op Vital signs reviewed, Patient's Cardiovascular Status Stable, Respiratory Function Stable, Patent Airway and No signs of Nausea or vomiting  Post-op Vital Signs: stable  Complications: No apparent anesthesia complications

## 2011-05-19 NOTE — Transfer of Care (Signed)
Immediate Anesthesia Transfer of Care Note  Patient: Mallory Mendez  Procedure(s) Performed:  HERNIA REPAIR INCISIONAL - repair incisional hernia with mesh  Patient Location: PACU  Anesthesia Type: General  Level of Consciousness: sedated, patient cooperative and responds to stimulaton  Airway & Oxygen Therapy: Patient Spontanous Breathing and Patient connected to face mask oxgen  Post-op Assessment: Report given to PACU RN, Post -op Vital signs reviewed and stable and Patient moving all extremities X 4  Post vital signs: Reviewed and stable  Complications: No apparent anesthesia complications

## 2011-05-19 NOTE — Addendum Note (Signed)
Addendum  created 05/19/11 1252 by Mechele Dawley   Modules edited:Anesthesia Medication Administration

## 2011-05-20 LAB — GLUCOSE, CAPILLARY: Glucose-Capillary: 144 mg/dL — ABNORMAL HIGH (ref 70–99)

## 2011-05-20 MED ORDER — MORPHINE SULFATE 4 MG/ML IJ SOLN
INTRAMUSCULAR | Status: AC
  Administered 2011-05-20: 2 mg
  Filled 2011-05-20: qty 1

## 2011-05-20 MED ORDER — MORPHINE SULFATE 2 MG/ML IJ SOLN
2.0000 mg | INTRAMUSCULAR | Status: DC | PRN
  Administered 2011-05-20 (×2): 3 mg via INTRAVENOUS
  Filled 2011-05-20: qty 1
  Filled 2011-05-20: qty 2
  Filled 2011-05-20: qty 1

## 2011-05-20 MED ORDER — KETOROLAC TROMETHAMINE 30 MG/ML IJ SOLN
30.0000 mg | Freq: Once | INTRAMUSCULAR | Status: AC
  Administered 2011-05-20: 30 mg via INTRAVENOUS
  Filled 2011-05-20: qty 1

## 2011-05-20 NOTE — Discharge Summary (Signed)
Physician Discharge Summary  Patient ID: Mallory Mendez MRN: [id number] DOB/AGE: Jan 28, 1956 55 y.o.  Admit date: 05/19/2011 Discharge date: 05/20/2011  Admission Diagnoses:Recurrant incisional hernia  Discharge Diagnoses: Re currant incisional hernia Active Problems:  * No active hospital problems. *    Discharged Condition: good  Hospital Course: Patient had repair of this umbilical hernia with medium ventral patch     Treatments: surgery: incisional hernia repair  Discharge Exam: Blood pressure 140/79, pulse 82, temperature 98.4 F (36.9 C), temperature source Oral, resp. rate 20, height 5\' 3"  (1.6 m), weight 150 lb (68.04 kg), SpO2 98.00%.   Disposition: Home or Self Care  Discharge Orders    Future Appointments: Provider: Department: Dept Phone: Center:   06/15/2011 10:40 AM Ranelle Oyster [facility] [phone] [facility]     Current Discharge Medication List    CONTINUE these medications which have NOT CHANGED   Details  ALPRAZolam (XANAX) 1 MG tablet Take 1 mg by mouth 3 (three) times daily as needed. Anxiety      aspirin EC 81 MG tablet Take 81 mg by mouth every morning.      cycloSPORINE (RESTASIS) 0.05 % ophthalmic emulsion Place 1 drop into both eyes 2 (two) times daily.      desvenlafaxine (PRISTIQ) 100 MG 24 hr tablet Take 100 mg by mouth every morning.     docusate sodium (COLACE) 100 MG capsule Take 100 mg by mouth 2 (two) times daily.      Estradiol-Norgestimate (PREFEST) 1/1-0.09 MG (15/15) TABS Take 1 tablet by mouth at bedtime.     gabapentin (NEURONTIN) 800 MG tablet Take 800 mg by mouth 3 (three) times daily.     imipramine (TOFRANIL) 50 MG tablet Take 50 mg by mouth at bedtime.     insulin glargine (LANTUS) 100 UNIT/ML injection Inject 21 Units into the skin at bedtime.     lubiprostone (AMITIZA) 24 MCG capsule Take 24 mcg by mouth 2 (two) times daily with a meal.     methylphenidate (RITALIN) 10 MG tablet Take 15 mg by mouth 2  (two) times daily.     minocycline (DYNACIN) 100 MG tablet Take 100 mg by mouth 2 (two) times daily as needed. outbreaks    morphine (MSIR) 30 MG tablet Take 30 mg by mouth every 4 (four) hours as needed. Pain     ondansetron (ZOFRAN) 4 MG tablet Take 4 mg by mouth every 8 (eight) hours as needed. Nausea      oxyCODONE-acetaminophen (PERCOCET) 10-325 MG per tablet Take 1 tablet by mouth every 4 (four) hours as needed. pain    zolpidem (AMBIEN) 10 MG tablet Take 10 mg by mouth at bedtime as needed. sleep    atorvastatin (LIPITOR) 40 MG tablet Take 40 mg by mouth at bedtime.     esomeprazole (NEXIUM) 40 MG capsule Take 40 mg by mouth at bedtime.     insulin lispro (HUMALOG) 100 UNIT/ML injection Inject 1-4 Units into the skin 3 (three) times daily before meals. Sliding scale    lisinopril (PRINIVIL,ZESTRIL) 20 MG tablet Take 20 mg by mouth at bedtime.     torsemide (DEMADEX) 20 MG tablet Take 20 mg by mouth every morning.       STOP taking these medications     fish oil-omega-3 fatty acids 1000 MG capsule      topiramate (TOPAMAX) 100 MG tablet      clonazePAM (KLONOPIN) 0.5 MG tablet        Follow-up  Information    Follow up with Iona Coach, MD. Call on 05/31/2011.   Contact information:   [facility] [address] [place] [address] [phone]          Signed: Iona Coach 05/20/2011, 9:37 AM

## 2011-05-20 NOTE — Progress Notes (Signed)
Patients brother called to check on discharge status.  Checked with patient in order to release this information to brother.  Patient consented to notifying brother that she is to be discharged.  Spoke with patients nurse concerning discharge, and discharge order confirmed.  Informed brother of above information.  Brother become loud on phone, and yelling, " she can not be discharged, I will call Dr Zachery Dakins myself and have him reverse this."  Phone disconnected on other end and dial tone heard.  Spoke with patient concerning above situation, but voiced no concerns about discharge at this time, and states her husband will be with her and here to transport her home.  Patient denies any reason to postpone discharge.  Patients rn made aware of the above. Discharge in process currently.

## 2011-05-20 NOTE — Progress Notes (Signed)
1 Day Post-Op  Subjective: Abdomen soft voided ok afebrile ok to discharge today. Will use her percocet on prn basis  Objective: Vital signs in last 24 hours: Temp:  [96.7 F (35.9 C)-99 F (37.2 C)] 98.4 F (36.9 C) (11/15 0430) Pulse Rate:  [62-93] 82  (11/15 0430) Resp:  [11-20] 20  (11/15 0430) BP: (105-143)/(57-88) 140/79 mmHg (11/15 0430) SpO2:  [98 %-100 %] 98 % (11/15 0430) Weight:  [150 lb (68.04 kg)] 150 lb (68.04 kg) (11/14 1942) Last BM Date: 05/18/11  Intake/Output from previous day: 11/14 0701 - 11/15 0700 In: 3514 [P.O.:600; I.V.:2914] Out: 2975 [Urine:2950; Blood:25]  Anti-infectives: Anti-infectives     Start     Dose/Rate Route Frequency Ordered Stop   05/19/11 0745   ceFAZolin (ANCEF) IVPB 1 g/50 mL premix        1 g 100 mL/hr over 30 Minutes Intravenous 60 min pre-op 05/19/11 0709 05/19/11 0836          Assessment/Plan: s/p Procedure(s): HERNIA REPAIR INCISIONAL Discharge  LOS: 1 day    Labrisha Wuellner J 05/20/2011

## 2011-05-20 NOTE — Progress Notes (Signed)
Discharge instructions reviewed with pt.  Pt verbalized understanding of discharge instructions.  No voiced concerns.  Pt discharged home with family.

## 2011-05-21 ENCOUNTER — Encounter (HOSPITAL_COMMUNITY): Payer: Self-pay | Admitting: General Surgery

## 2011-05-24 ENCOUNTER — Ambulatory Visit (INDEPENDENT_AMBULATORY_CARE_PROVIDER_SITE_OTHER): Payer: Medicare Other | Admitting: General Surgery

## 2011-06-02 ENCOUNTER — Encounter (INDEPENDENT_AMBULATORY_CARE_PROVIDER_SITE_OTHER): Payer: Self-pay | Admitting: General Surgery

## 2011-06-02 ENCOUNTER — Other Ambulatory Visit (INDEPENDENT_AMBULATORY_CARE_PROVIDER_SITE_OTHER): Payer: Self-pay | Admitting: General Surgery

## 2011-06-02 ENCOUNTER — Ambulatory Visit (INDEPENDENT_AMBULATORY_CARE_PROVIDER_SITE_OTHER): Payer: Medicare Other | Admitting: General Surgery

## 2011-06-02 VITALS — BP 132/76 | HR 80 | Temp 97.2°F | Resp 16 | Ht 63.0 in | Wt 155.2 lb

## 2011-06-02 DIAGNOSIS — K432 Incisional hernia without obstruction or gangrene: Secondary | ICD-10-CM

## 2011-06-02 NOTE — Progress Notes (Signed)
Subjective:     Patient ID: Mallory Mendez, female   DOB: 1955/12/27, 55 y.o.   MRN: [id number]  HPI pt returns now about 2 weeks following repair recurrant incisional hernia with med umbilical hernia. Says no fever but small amount drainage top if umbilical incision no fever pain deareasing.   Review of Systems     Objective:   Physical ExamBP 132/76  Pulse 80  Temp(Src) 97.2 F (36.2 C) (Temporal)  Resp 16  Ht 5\' 3"  (1.6 m)  Wt 155 lb 4 oz (70.421 kg)  BMI 27.50 kg/m2 Small mass in old hernia site but no reddness or signs of infection. Staples removed mass aspirated serosanguinous fluid 4cc mass decreased. Culture sent     Assessment:     Hopefully small hematoma without infection. Start po Keflex and await culture  Staples out steristrips on    Plan:     Return Monday

## 2011-06-02 NOTE — Patient Instructions (Signed)
Keflex 500 mg q.i.d. start today and he may continue to shower and if areas drainages wash the area off I want to see you Monday so I'll have the results of the cultures I may need to up and the top part of the incision if there is bacteria  in the culture

## 2011-06-03 ENCOUNTER — Inpatient Hospital Stay (HOSPITAL_COMMUNITY): Admission: RE | Admit: 2011-06-03 | Payer: Medicare Other | Source: Ambulatory Visit | Admitting: Speech Pathology

## 2011-06-05 LAB — WOUND CULTURE
Gram Stain: NONE SEEN
Gram Stain: NONE SEEN
Organism ID, Bacteria: NO GROWTH

## 2011-06-07 ENCOUNTER — Encounter (INDEPENDENT_AMBULATORY_CARE_PROVIDER_SITE_OTHER): Payer: Self-pay | Admitting: General Surgery

## 2011-06-07 ENCOUNTER — Ambulatory Visit (INDEPENDENT_AMBULATORY_CARE_PROVIDER_SITE_OTHER): Payer: Medicare Other | Admitting: General Surgery

## 2011-06-07 VITALS — BP 127/84 | HR 80 | Temp 97.9°F | Resp 16 | Ht 63.0 in | Wt 150.2 lb

## 2011-06-07 DIAGNOSIS — K429 Umbilical hernia without obstruction or gangrene: Secondary | ICD-10-CM

## 2011-06-07 NOTE — Patient Instructions (Signed)
Complete the present antibiotics and plan on seeing me Thursday of next week. Hopefully no further aspirations will be needed. If you haven't increased pain incision or redness that I can see you here in the office this Friday

## 2011-06-07 NOTE — Progress Notes (Signed)
Patient ID: Mallory Mendez, female   DOB: 1956-04-28, 55 y.o.   MRN: [id number] Ascending returns today swelling to the left side where she had a big hernia sac is not as swollen as it was when I saw her last week and aspirated the area and some occult or the culture did not show any bacteria she's not having any fever an ultrasound today you can see that there is probably a good walnut-sized area of the hematoma aspirated with a #18-gauge needle the fluid does not have any odor I'm not going to send a repeat culture like for her to see me Friday or Thursday one week from now she's got his got about 3 more days of Keflex placed roll in order to complete that antibiotic.  She said that she had a little drainage some time of the last 3 or 4 days but there is no drainage and I can see from the incision and remove the Steri-Strips her bowels are working satisfactory I did not do a rectal exam orifice and no change in her chronic pain medications. BP 127/84  Pulse 80  Temp(Src) 97.9 F (36.6 C) (Temporal)  Resp 16  Ht 5\' 3"  (1.6 m)  Wt 150 lb 3.2 oz (68.13 kg)  BMI 26.61 kg/m2

## 2011-06-11 ENCOUNTER — Ambulatory Visit (INDEPENDENT_AMBULATORY_CARE_PROVIDER_SITE_OTHER): Payer: Medicare Other | Admitting: Ophthalmology

## 2011-06-15 ENCOUNTER — Encounter: Payer: Medicare Other | Attending: Physical Medicine & Rehabilitation | Admitting: Physical Medicine & Rehabilitation

## 2011-06-17 ENCOUNTER — Encounter (INDEPENDENT_AMBULATORY_CARE_PROVIDER_SITE_OTHER): Payer: Medicare Other | Admitting: General Surgery

## 2011-06-18 ENCOUNTER — Ambulatory Visit (INDEPENDENT_AMBULATORY_CARE_PROVIDER_SITE_OTHER): Payer: Medicare Other | Admitting: Ophthalmology

## 2011-06-18 DIAGNOSIS — H43819 Vitreous degeneration, unspecified eye: Secondary | ICD-10-CM

## 2011-06-18 DIAGNOSIS — E1065 Type 1 diabetes mellitus with hyperglycemia: Secondary | ICD-10-CM

## 2011-06-18 DIAGNOSIS — E1039 Type 1 diabetes mellitus with other diabetic ophthalmic complication: Secondary | ICD-10-CM

## 2011-06-18 DIAGNOSIS — E11319 Type 2 diabetes mellitus with unspecified diabetic retinopathy without macular edema: Secondary | ICD-10-CM

## 2011-06-18 DIAGNOSIS — H35039 Hypertensive retinopathy, unspecified eye: Secondary | ICD-10-CM

## 2011-06-18 DIAGNOSIS — I10 Essential (primary) hypertension: Secondary | ICD-10-CM

## 2011-06-22 ENCOUNTER — Encounter: Payer: Medicare Other | Attending: Physical Medicine & Rehabilitation | Admitting: Physical Medicine & Rehabilitation

## 2011-06-22 DIAGNOSIS — G479 Sleep disorder, unspecified: Secondary | ICD-10-CM | POA: Insufficient documentation

## 2011-06-22 DIAGNOSIS — F0781 Postconcussional syndrome: Secondary | ICD-10-CM

## 2011-06-22 DIAGNOSIS — R51 Headache: Secondary | ICD-10-CM | POA: Insufficient documentation

## 2011-06-22 DIAGNOSIS — R413 Other amnesia: Secondary | ICD-10-CM | POA: Insufficient documentation

## 2011-06-22 DIAGNOSIS — S069XAA Unspecified intracranial injury with loss of consciousness status unknown, initial encounter: Secondary | ICD-10-CM

## 2011-06-22 DIAGNOSIS — F3289 Other specified depressive episodes: Secondary | ICD-10-CM | POA: Insufficient documentation

## 2011-06-22 DIAGNOSIS — G8929 Other chronic pain: Secondary | ICD-10-CM | POA: Insufficient documentation

## 2011-06-22 DIAGNOSIS — R41840 Attention and concentration deficit: Secondary | ICD-10-CM | POA: Insufficient documentation

## 2011-06-22 DIAGNOSIS — M545 Low back pain, unspecified: Secondary | ICD-10-CM | POA: Insufficient documentation

## 2011-06-22 DIAGNOSIS — F329 Major depressive disorder, single episode, unspecified: Secondary | ICD-10-CM | POA: Insufficient documentation

## 2011-06-22 DIAGNOSIS — S069X9A Unspecified intracranial injury with loss of consciousness of unspecified duration, initial encounter: Secondary | ICD-10-CM

## 2011-06-22 DIAGNOSIS — F411 Generalized anxiety disorder: Secondary | ICD-10-CM | POA: Insufficient documentation

## 2011-06-22 DIAGNOSIS — F341 Dysthymic disorder: Secondary | ICD-10-CM

## 2011-06-22 NOTE — Assessment & Plan Note (Signed)
HISTORY:  Mallory Mendez is back regarding her postconcussion syndrome. Unfortunately she was in the hospital for ventral hernia and had a mesh placed over this in November.  This is doing better she tells me.  Her headaches are present but a bit better, but seems to be more anything at the end of the day when she becomes stressed and overloaded.  She is sleeping much better with the Klonopin now.  We increase the Ritalin to helping with her attention during the day.  She is not to continue with physical therapy due to her medical problems above.  Pain today is about 4-7/10.  REVIEW OF SYSTEMS:  Notable for weakness, trouble, walking, dizziness, confusion, weight loss, nausea.  Full 12-point review is in the written health and history section of the chart.  SOCIAL HISTORY:  The patient is married.  She is with her daughter today who she is now living with.  Daughter seems fairly interested.  PHYSICAL EXAMINATION:  VITAL SIGNS:  Blood pressure is 127/52, pulse 79, respiratory rate 16, she is satting 96% on room air. GENERAL:  The patient is a pleasant.  She does see more calm today and she is more focused overall.  Memory is still poor as well as insight, however.  I had her walk for me today.  She still tends to drift to the right.  She needs hand on for balance and uses walls frequently for assistance.  She states that she lost her cane.  Strength generally 5/5. Anxiety was much better. HEART:  Regular. CHEST:  Clear. ABDOMEN:  Soft, nontender.  ASSESSMENT: 1. 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 2. Chronic low back pain. 3. Acute-on-chronic headaches.  I feel that her headaches are     generally driven by her problems listed above.  PLAN: 1. We will change Ritalin to allow her some extra coverage late in the     day.  We will use 15 mg at 7:00 a.m. at approximately  noon and     another 10 mg at 3:30.  With this dosing schedule affects her     sleep, she needs to change perhaps maybe remove the late dose and     take the second dose a bit later in the afternoon perhaps at 1 or     1:30. 2. Stay with Klonopin and trazodone and Topamax as written. 3. We wrote therapy at [facility] to work on     cognition and vestibular symptoms. 4. No other med changes were made today. 5. I will see her back here in about 4-6 weeks' time.     Ranelle Oyster, M.D. Electronically Signed    ZTS/MedQ D:  06/22/2011 13:17:19  T:  06/22/2011 19:39:13  Job #:  [id number]

## 2011-06-24 ENCOUNTER — Ambulatory Visit (INDEPENDENT_AMBULATORY_CARE_PROVIDER_SITE_OTHER): Payer: Medicare Other | Admitting: General Surgery

## 2011-06-24 ENCOUNTER — Encounter (INDEPENDENT_AMBULATORY_CARE_PROVIDER_SITE_OTHER): Payer: Self-pay | Admitting: General Surgery

## 2011-06-24 VITALS — BP 124/88 | HR 84 | Temp 97.0°F | Resp 18 | Ht 63.0 in | Wt 155.4 lb

## 2011-06-24 DIAGNOSIS — K432 Incisional hernia without obstruction or gangrene: Secondary | ICD-10-CM

## 2011-06-24 NOTE — Progress Notes (Signed)
Patient ID: Mallory Mendez, female   DOB: 11-13-1955, 55 y.o.   MRN: [id number] BP 124/88  Pulse 84  Temp 97 F (36.1 C)  Resp 18  Ht 5\' 3"  (1.6 m)  Wt 155 lb 6.4 oz (70.489 kg)  BMI 27.53 kg/m2 [name] was accompanied today by her daughter who had not previously met and she states that she is helping her mother cut back significantly only there is pain medications that she's been taken. Her incision where she had this hernia really been unable is healing evidence of inflammation and I don't appreciate any evidence of any fluid today I aspirated the little seroma hematoma on 2 occasions he did not grow any organisms and there was one staple in the medical that I remove. She cholecystis been given her medications for the various pain at home the pain clinic and I would not prescribe any pain medicines postoperatively. She states her bowels are working more regular and usual most likely related to decrease in the chronic narcotic usage and she can return to see one of my partners for follow up visit in approximately one month. I recommend that Dr.Toth since in the middle age of our group. She'll call and make an appointment to see him an approximate 6 weeks if there's any issues and I do not and her daughter is able to intervene and try to decrease some of her mother's chronic narcotic usage

## 2011-07-26 ENCOUNTER — Other Ambulatory Visit (HOSPITAL_COMMUNITY): Payer: Self-pay | Admitting: Interventional Radiology

## 2011-07-26 DIAGNOSIS — R4781 Slurred speech: Secondary | ICD-10-CM

## 2011-07-26 DIAGNOSIS — R413 Other amnesia: Secondary | ICD-10-CM

## 2011-07-26 DIAGNOSIS — R519 Headache, unspecified: Secondary | ICD-10-CM

## 2011-07-26 DIAGNOSIS — I729 Aneurysm of unspecified site: Secondary | ICD-10-CM

## 2011-07-26 DIAGNOSIS — F0781 Postconcussional syndrome: Secondary | ICD-10-CM

## 2011-07-26 DIAGNOSIS — R42 Dizziness and giddiness: Secondary | ICD-10-CM

## 2011-07-27 ENCOUNTER — Ambulatory Visit (HOSPITAL_COMMUNITY): Admission: RE | Admit: 2011-07-27 | Payer: Medicare Other | Source: Ambulatory Visit

## 2011-07-27 ENCOUNTER — Other Ambulatory Visit (HOSPITAL_COMMUNITY): Payer: Self-pay | Admitting: Interventional Radiology

## 2011-07-27 ENCOUNTER — Ambulatory Visit (HOSPITAL_COMMUNITY)
Admission: RE | Admit: 2011-07-27 | Discharge: 2011-07-27 | Disposition: A | Discharge status: Home / Self Care | Payer: Medicare Other | Source: Ambulatory Visit | Attending: Interventional Radiology | Admitting: Interventional Radiology

## 2011-07-27 DIAGNOSIS — R4789 Other speech disturbances: Secondary | ICD-10-CM | POA: Insufficient documentation

## 2011-07-27 DIAGNOSIS — H538 Other visual disturbances: Secondary | ICD-10-CM

## 2011-07-27 DIAGNOSIS — R4781 Slurred speech: Secondary | ICD-10-CM

## 2011-07-27 DIAGNOSIS — R413 Other amnesia: Secondary | ICD-10-CM

## 2011-07-27 DIAGNOSIS — R42 Dizziness and giddiness: Secondary | ICD-10-CM

## 2011-07-27 DIAGNOSIS — F17200 Nicotine dependence, unspecified, uncomplicated: Secondary | ICD-10-CM | POA: Diagnosis not present

## 2011-07-27 DIAGNOSIS — I6789 Other cerebrovascular disease: Secondary | ICD-10-CM | POA: Insufficient documentation

## 2011-07-27 DIAGNOSIS — F0781 Postconcussional syndrome: Secondary | ICD-10-CM

## 2011-07-27 DIAGNOSIS — I729 Aneurysm of unspecified site: Secondary | ICD-10-CM | POA: Diagnosis not present

## 2011-07-27 DIAGNOSIS — R519 Headache, unspecified: Secondary | ICD-10-CM

## 2011-07-27 DIAGNOSIS — R269 Unspecified abnormalities of gait and mobility: Secondary | ICD-10-CM

## 2011-07-27 DIAGNOSIS — R51 Headache: Secondary | ICD-10-CM | POA: Diagnosis not present

## 2011-07-27 DIAGNOSIS — Z8679 Personal history of other diseases of the circulatory system: Secondary | ICD-10-CM | POA: Diagnosis not present

## 2011-07-27 DIAGNOSIS — I671 Cerebral aneurysm, nonruptured: Secondary | ICD-10-CM | POA: Diagnosis not present

## 2011-08-02 DIAGNOSIS — E119 Type 2 diabetes mellitus without complications: Secondary | ICD-10-CM | POA: Insufficient documentation

## 2011-08-02 DIAGNOSIS — R42 Dizziness and giddiness: Secondary | ICD-10-CM | POA: Insufficient documentation

## 2011-08-02 DIAGNOSIS — S060XAA Concussion with loss of consciousness status unknown, initial encounter: Secondary | ICD-10-CM | POA: Insufficient documentation

## 2011-08-03 DIAGNOSIS — R42 Dizziness and giddiness: Secondary | ICD-10-CM | POA: Diagnosis not present

## 2011-08-03 DIAGNOSIS — IMO0002 Reserved for concepts with insufficient information to code with codable children: Secondary | ICD-10-CM | POA: Diagnosis not present

## 2011-08-03 DIAGNOSIS — G4700 Insomnia, unspecified: Secondary | ICD-10-CM | POA: Diagnosis not present

## 2011-08-04 ENCOUNTER — Encounter: Payer: Medicare Other | Attending: Physical Medicine & Rehabilitation | Admitting: Physical Medicine & Rehabilitation

## 2011-08-04 DIAGNOSIS — M545 Low back pain, unspecified: Secondary | ICD-10-CM | POA: Insufficient documentation

## 2011-08-04 DIAGNOSIS — F341 Dysthymic disorder: Secondary | ICD-10-CM | POA: Diagnosis not present

## 2011-08-04 DIAGNOSIS — R41840 Attention and concentration deficit: Secondary | ICD-10-CM | POA: Insufficient documentation

## 2011-08-04 DIAGNOSIS — R413 Other amnesia: Secondary | ICD-10-CM | POA: Insufficient documentation

## 2011-08-04 DIAGNOSIS — F411 Generalized anxiety disorder: Secondary | ICD-10-CM | POA: Insufficient documentation

## 2011-08-04 DIAGNOSIS — G8929 Other chronic pain: Secondary | ICD-10-CM | POA: Insufficient documentation

## 2011-08-04 DIAGNOSIS — G479 Sleep disorder, unspecified: Secondary | ICD-10-CM | POA: Diagnosis not present

## 2011-08-04 DIAGNOSIS — F0781 Postconcussional syndrome: Secondary | ICD-10-CM | POA: Diagnosis not present

## 2011-08-04 DIAGNOSIS — F329 Major depressive disorder, single episode, unspecified: Secondary | ICD-10-CM | POA: Diagnosis not present

## 2011-08-04 DIAGNOSIS — F3289 Other specified depressive episodes: Secondary | ICD-10-CM | POA: Diagnosis not present

## 2011-08-04 DIAGNOSIS — S069XAA Unspecified intracranial injury with loss of consciousness status unknown, initial encounter: Secondary | ICD-10-CM | POA: Diagnosis not present

## 2011-08-04 DIAGNOSIS — R42 Dizziness and giddiness: Secondary | ICD-10-CM | POA: Insufficient documentation

## 2011-08-04 DIAGNOSIS — S069X9A Unspecified intracranial injury with loss of consciousness of unspecified duration, initial encounter: Secondary | ICD-10-CM | POA: Diagnosis not present

## 2011-08-04 NOTE — Assessment & Plan Note (Signed)
Mallory Mendez is back regarding postconcussion syndrome.  Daughter-in-law is with her today who is now living with her and states that she is getting worse.  The patient reports some word slurring, although this has been an issue before.  She has problems with sleep, headaches, and memory. Pain is 7-8/10.  She is using her Ritalin and keeping a memory log now. She is trying to get to sleep around 10 o'clock but has a hard time getting relaxed.  She is taking the Klonopin at bedtime, 0.5 mg.  She remains on Topamax as well.  REVIEW OF SYSTEMS:  Notable for the above.  Full 12-point review is in the written health and history section of the chart.  Does report decreased appetite taste, constipation, nausea, and coughing as well.  SOCIAL HISTORY:  The patient is married.  Daughter-in-law is with her and states that she can help get her to appointments now.  PHYSICAL EXAMINATION:  VITAL SIGNS:  Blood pressure is 140/72, pulse 88, respiratory rate is 16, and she is saturating 100% on room air. GENERAL:  The patient is generally pleasant.  I felt she was more calm and directed today overall.  Still has poor insight and awareness. MUSCULOSKELETAL:  She tends to drift to the right and backwards.  She needs hand for balance still.  She is using her walker today fortunately.  Strength is generally 5/5. HEART:  Regular. CHEST:  Clear. ABDOMEN:  Soft and nontender. NEUROLOGIC:  She continues to have some vertigo with lateral gaze.  ASSESSMENT: 1. Postconcussion syndrome with positional vertigo and persistent     vestibular symptoms, attention deficit, memory and sleep     dysfunction, anxiety, and depression.  I spoke with the family     member today and told her that I do not feel that by any means she     is worse.  I think she is improved actually over the last few     months, but we still struggle with consistency regarding followup     and with continuity care overall.  She recently had  abdominal     hernia issues which put off therapy in the fall. 2. Chronic low back pain.  PLAN: 1. We will stay with Ritalin at the 7:00 am and 12 noon dosing for     now. 2. I increased her Klonopin to 1 mg at bedtime.  May increase to 2 mg     as needed for sleep and anxiety at nighttime.  I have expressed to     the patient and her daughter the importance of sleep in this     picture. 3. The patient is going to outpatient therapy at [facility] for     speech, PT, and OT.  I would like Dr. Leonides Cave involved as well for     assessment of mood, coping skills as well as cognition, the input. 4. We will use Fioricet for more severe breakthrough pain.  We will     stay with Topamax for headaches for now at 100 mg at bedtime dose. 5. I will see her back here in about 2 months' time.  Again poor     continuity and follow through remains an ongoing theme here.     Ranelle Oyster, M.D. Electronically Signed   ZTS/MedQ D:  08/04/2011 14:38:05  T:  08/04/2011 15:34:47  Job #:  [id number]

## 2011-08-10 DIAGNOSIS — L708 Other acne: Secondary | ICD-10-CM | POA: Diagnosis not present

## 2011-08-10 DIAGNOSIS — L259 Unspecified contact dermatitis, unspecified cause: Secondary | ICD-10-CM | POA: Diagnosis not present

## 2011-08-13 ENCOUNTER — Ambulatory Visit: Payer: Medicare Other | Attending: Physical Medicine & Rehabilitation | Admitting: Physical Therapy

## 2011-08-13 ENCOUNTER — Ambulatory Visit: Payer: Medicare Other

## 2011-09-07 DIAGNOSIS — F0781 Postconcussional syndrome: Secondary | ICD-10-CM | POA: Diagnosis not present

## 2011-09-07 DIAGNOSIS — F329 Major depressive disorder, single episode, unspecified: Secondary | ICD-10-CM | POA: Diagnosis not present

## 2011-09-07 DIAGNOSIS — F3289 Other specified depressive episodes: Secondary | ICD-10-CM | POA: Diagnosis not present

## 2011-09-08 ENCOUNTER — Telehealth: Payer: Self-pay | Admitting: Physical Medicine & Rehabilitation

## 2011-09-08 DIAGNOSIS — E785 Hyperlipidemia, unspecified: Secondary | ICD-10-CM | POA: Diagnosis not present

## 2011-09-08 DIAGNOSIS — I10 Essential (primary) hypertension: Secondary | ICD-10-CM | POA: Diagnosis not present

## 2011-09-08 DIAGNOSIS — L2089 Other atopic dermatitis: Secondary | ICD-10-CM | POA: Diagnosis not present

## 2011-09-08 DIAGNOSIS — M549 Dorsalgia, unspecified: Secondary | ICD-10-CM | POA: Diagnosis not present

## 2011-09-08 DIAGNOSIS — E109 Type 1 diabetes mellitus without complications: Secondary | ICD-10-CM | POA: Diagnosis not present

## 2011-09-08 DIAGNOSIS — R269 Unspecified abnormalities of gait and mobility: Secondary | ICD-10-CM | POA: Diagnosis not present

## 2011-09-08 MED ORDER — METHYLPHENIDATE HCL 10 MG PO TABS: 15.0000 mg | ORAL_TABLET | Freq: Two times a day (BID) | ORAL | Status: DC

## 2011-09-08 NOTE — Telephone Encounter (Signed)
Needs refill on Ritalin.  Please call and tell her what to do.

## 2011-09-08 NOTE — Telephone Encounter (Signed)
Last seen on 08/04/11. Last fill of Ritalin 10mg  1 1/2 tablet at 0700 and 1200 Noon #90 was on 08/04/11. Please advise.

## 2011-09-08 NOTE — Telephone Encounter (Signed)
May refill 

## 2011-09-08 NOTE — Telephone Encounter (Signed)
Pt aware rx is ready for pick up by message.

## 2011-09-15 DIAGNOSIS — M79609 Pain in unspecified limb: Secondary | ICD-10-CM | POA: Diagnosis not present

## 2011-09-22 DIAGNOSIS — F3289 Other specified depressive episodes: Secondary | ICD-10-CM | POA: Diagnosis not present

## 2011-09-22 DIAGNOSIS — F329 Major depressive disorder, single episode, unspecified: Secondary | ICD-10-CM

## 2011-09-22 DIAGNOSIS — F0781 Postconcussional syndrome: Secondary | ICD-10-CM | POA: Diagnosis not present

## 2011-09-23 ENCOUNTER — Ambulatory Visit
Admission: RE | Admit: 2011-09-23 | Discharge: 2011-09-23 | Disposition: A | Discharge status: Home / Self Care | Payer: Medicare Other | Source: Ambulatory Visit | Attending: Family Medicine | Admitting: Family Medicine

## 2011-09-23 ENCOUNTER — Other Ambulatory Visit: Payer: Self-pay | Admitting: Family Medicine

## 2011-09-23 DIAGNOSIS — R5381 Other malaise: Secondary | ICD-10-CM | POA: Diagnosis not present

## 2011-09-23 DIAGNOSIS — R5383 Other fatigue: Secondary | ICD-10-CM | POA: Diagnosis not present

## 2011-09-23 DIAGNOSIS — E785 Hyperlipidemia, unspecified: Secondary | ICD-10-CM | POA: Diagnosis not present

## 2011-09-23 DIAGNOSIS — M79609 Pain in unspecified limb: Secondary | ICD-10-CM

## 2011-09-23 DIAGNOSIS — E109 Type 1 diabetes mellitus without complications: Secondary | ICD-10-CM | POA: Diagnosis not present

## 2011-09-23 DIAGNOSIS — M7989 Other specified soft tissue disorders: Secondary | ICD-10-CM | POA: Diagnosis not present

## 2011-09-23 DIAGNOSIS — I10 Essential (primary) hypertension: Secondary | ICD-10-CM | POA: Diagnosis not present

## 2011-09-28 ENCOUNTER — Encounter: Payer: Self-pay | Admitting: Physical Medicine & Rehabilitation

## 2011-09-29 ENCOUNTER — Encounter: Payer: Medicare Other | Attending: Physical Medicine & Rehabilitation | Admitting: Physical Medicine & Rehabilitation

## 2011-09-29 ENCOUNTER — Encounter: Payer: Self-pay | Admitting: Physical Medicine & Rehabilitation

## 2011-09-29 VITALS — BP 153/82 | HR 104 | Resp 18 | Ht 63.0 in | Wt 127.8 lb

## 2011-09-29 DIAGNOSIS — H8110 Benign paroxysmal vertigo, unspecified ear: Secondary | ICD-10-CM | POA: Diagnosis not present

## 2011-09-29 DIAGNOSIS — S060XAA Concussion with loss of consciousness status unknown, initial encounter: Secondary | ICD-10-CM | POA: Diagnosis not present

## 2011-09-29 DIAGNOSIS — S060X9A Concussion with loss of consciousness of unspecified duration, initial encounter: Secondary | ICD-10-CM | POA: Diagnosis not present

## 2011-09-29 DIAGNOSIS — F0781 Postconcussional syndrome: Secondary | ICD-10-CM

## 2011-09-29 DIAGNOSIS — E119 Type 2 diabetes mellitus without complications: Secondary | ICD-10-CM | POA: Diagnosis not present

## 2011-09-29 MED ORDER — CLONAZEPAM 2 MG PO TABS: 2.0000 mg | ORAL_TABLET | Freq: Two times a day (BID) | ORAL | Status: DC | PRN

## 2011-09-29 MED ORDER — METHYLPHENIDATE HCL 10 MG PO TABS: 15.0000 mg | ORAL_TABLET | Freq: Two times a day (BID) | ORAL | Status: DC

## 2011-09-29 NOTE — Patient Instructions (Signed)
YOU NEED TO SLEEP ON A REGULAR SCHEDULE.  YOU NEED 8-10 HOURS OF SLEEP AT NIGHT AT A NORMAL TIME!   YOUR KIDS NEED TO LEAVE YOU ALONE AT NIGHT.    YOU NEED TO STOP TRYING TO DO EVERYTHING FOR YOUR ADULT KIDS!!!!!!!   YOU NEED TO TAKE CARE OF YOURSELF!!!!!!!!!!!!!!!!

## 2011-09-29 NOTE — Progress Notes (Signed)
Subjective:    Patient ID: Mallory Mendez, female    DOB: 1956-03-29, 55 y.o.   MRN: [id number]  HPI Mallory Mendez is back regarding her post concussion syndrome and associated deficits. Sleep is still an issue. She is not going to bed at reasonable times.  She is up often late at night.  She is on the phone and watches TV a lot at night and sometimes doesn't go to bed until 5AM some days.    She reports no falls but did develop a bruise on the left leg which has caused increased pain. She still has some dizziness. She states the leg has kept her awake at night. She doesn't recall what she bumped it on.  She feels that the counseling with Dr. Leonides Cave has helped her cope a little better with her anxiety and to develop some ground rules with her daughters at home.   Pain Inventory Average Pain 5 Pain Right Now 5 My pain is intermittent, stabbing, aching and throbbing  In the last 24 hours, has pain interfered with the following? General activity 8 Relation with others 10 Enjoyment of life 8 What TIME of day is your pain at its worst? evening Sleep (in general) Poor  Pain is worse with: bending and standing Pain improves with: rest Relief from Meds: 3  Mobility walk with assistance use a walker how many minutes can you walk? 0 ability to climb steps?  no do you drive?  yes needs help with transfers Do you have any goals in this area?  yes  Function disabled: date disabled 59 I need assistance with the following:  dressing, bathing, meal prep, household duties and shopping  Neuro/Psych weakness tingling trouble walking dizziness confusion depression suicidal thoughts does not have a plan but had an episode about 3 weeks ago when she said she wanted to  Prior Studies Any changes since last visit?  yes x-rays ultrasound of left leg  Physicians involved in your care Any changes since last visit?  no      Review of Systems  Constitutional: Positive for diaphoresis and  unexpected weight change.       Night sweats, weight loss and high blood sugars, poor appetite  Gastrointestinal: Positive for nausea, vomiting and constipation.  Skin: Positive for rash.  Neurological: Positive for dizziness.  Psychiatric/Behavioral: Positive for suicidal ideas, confusion and dysphoric mood.       Had episode with her son which precipitated her suicidal thoughts. No plan. Sees Dr Leonides Cave but did not tell him about it.  All other systems reviewed and are negative.       Objective:   Physical Exam  Constitutional: She is oriented to person, place, and time. She appears well-developed and well-nourished.  HENT:  Head: Normocephalic and atraumatic.  Eyes: Conjunctivae and EOM are normal. Pupils are equal, round, and reactive to light.  Neck: Normal range of motion.  Cardiovascular: Normal rate and regular rhythm.   Pulmonary/Chest: Effort normal.  Abdominal: Soft.  Neurological: She is alert and oriented to person, place, and time. She has normal strength. A sensory deficit is present. No cranial nerve deficit.       Balance is still imparied, but she's at no imminment risk for falling  No gross CN abnl.  No nystagmus  Skin: Skin is warm.       Old bruising on left shin.  Periosteum of tibia is sensitive to touch.   Psychiatric: Her speech is normal. Judgment and thought content normal. Her  mood appears anxious. She is withdrawn. Cognition and memory are impaired. She exhibits abnormal recent memory.          Assessment & Plan:  ASSESSMENT:  1. Postconcussion syndrome with positional vertigo and persistent  vestibular symptoms, attention deficit, memory and sleep  dysfunction, anxiety, and depression. There continue to be large family psychosocial factors at play---in particular, her children who remain quite dependent on her.  She feels an obligation to help them, but she has a hard enough time just helping herself currently.  2. Chronic low back pain.  PLAN:    1. We will stay with Ritalin at the 7:00 am and 12 noon dosing for  now.  2. Continue klonopin 2mg  at beditme. Needs to work on regular sleep patterns!  Without this, she has no hope of dealing with her neurological issues 4. We will use Fioricet for more severe breakthrough pain. We will  stay with Topamax for headaches for now at 100 mg at bedtime dose.  5. I will see her back here in about 3 months' time. Again poor  continuity and follow through remains an ongoing theme here.

## 2011-09-30 ENCOUNTER — Encounter: Payer: Self-pay | Admitting: Physical Medicine & Rehabilitation

## 2011-09-30 DIAGNOSIS — G4700 Insomnia, unspecified: Secondary | ICD-10-CM | POA: Diagnosis not present

## 2011-09-30 DIAGNOSIS — IMO0002 Reserved for concepts with insufficient information to code with codable children: Secondary | ICD-10-CM | POA: Diagnosis not present

## 2011-09-30 DIAGNOSIS — M545 Low back pain, unspecified: Secondary | ICD-10-CM | POA: Diagnosis not present

## 2011-09-30 DIAGNOSIS — M4120 Other idiopathic scoliosis, site unspecified: Secondary | ICD-10-CM | POA: Diagnosis not present

## 2011-09-30 DIAGNOSIS — Z79899 Other long term (current) drug therapy: Secondary | ICD-10-CM | POA: Diagnosis not present

## 2011-09-30 DIAGNOSIS — G479 Sleep disorder, unspecified: Secondary | ICD-10-CM | POA: Diagnosis not present

## 2011-10-11 DIAGNOSIS — F0781 Postconcussional syndrome: Secondary | ICD-10-CM | POA: Diagnosis not present

## 2011-10-11 DIAGNOSIS — F329 Major depressive disorder, single episode, unspecified: Secondary | ICD-10-CM | POA: Diagnosis not present

## 2011-10-11 DIAGNOSIS — F3289 Other specified depressive episodes: Secondary | ICD-10-CM | POA: Diagnosis not present

## 2011-10-20 ENCOUNTER — Telehealth (INDEPENDENT_AMBULATORY_CARE_PROVIDER_SITE_OTHER): Payer: Self-pay

## 2011-10-20 NOTE — Telephone Encounter (Signed)
In [facility] referral for follow up care by Dr. Zachery Dakins  is Dr. Carolynne Edouard.  Message left on patient's voicemail.

## 2011-10-27 ENCOUNTER — Telehealth (HOSPITAL_COMMUNITY): Payer: Self-pay

## 2011-10-27 NOTE — Telephone Encounter (Signed)
Pt will need to be scheduled in July for f/u Angio

## 2011-11-04 ENCOUNTER — Encounter (INDEPENDENT_AMBULATORY_CARE_PROVIDER_SITE_OTHER): Payer: Medicare Other | Admitting: General Surgery

## 2011-11-09 ENCOUNTER — Encounter (INDEPENDENT_AMBULATORY_CARE_PROVIDER_SITE_OTHER): Payer: Self-pay | Admitting: General Surgery

## 2011-11-18 DIAGNOSIS — F09 Unspecified mental disorder due to known physiological condition: Secondary | ICD-10-CM | POA: Diagnosis not present

## 2011-11-18 DIAGNOSIS — F329 Major depressive disorder, single episode, unspecified: Secondary | ICD-10-CM

## 2011-11-18 DIAGNOSIS — F3289 Other specified depressive episodes: Secondary | ICD-10-CM | POA: Diagnosis not present

## 2011-11-30 DIAGNOSIS — M545 Low back pain, unspecified: Secondary | ICD-10-CM | POA: Diagnosis not present

## 2011-11-30 DIAGNOSIS — R51 Headache: Secondary | ICD-10-CM | POA: Diagnosis not present

## 2011-11-30 DIAGNOSIS — G479 Sleep disorder, unspecified: Secondary | ICD-10-CM | POA: Diagnosis not present

## 2011-11-30 DIAGNOSIS — Z79899 Other long term (current) drug therapy: Secondary | ICD-10-CM | POA: Diagnosis not present

## 2011-12-01 ENCOUNTER — Ambulatory Visit (INDEPENDENT_AMBULATORY_CARE_PROVIDER_SITE_OTHER): Payer: Medicare Other | Admitting: General Surgery

## 2011-12-01 ENCOUNTER — Encounter (INDEPENDENT_AMBULATORY_CARE_PROVIDER_SITE_OTHER): Payer: Self-pay | Admitting: General Surgery

## 2011-12-01 DIAGNOSIS — R109 Unspecified abdominal pain: Secondary | ICD-10-CM | POA: Diagnosis not present

## 2011-12-01 NOTE — Patient Instructions (Signed)
Don't eat or drink anything 4 hours before the scan.  Drink the first bottle 2 hours before the scan, and drink the second bottle 1 hour before the scan.

## 2011-12-02 ENCOUNTER — Telehealth: Payer: Self-pay

## 2011-12-02 DIAGNOSIS — F0781 Postconcussional syndrome: Secondary | ICD-10-CM

## 2011-12-02 MED ORDER — BUTALBITAL-APAP-CAFFEINE 50-325-40 MG PO TABS: 1.0000 | ORAL_TABLET | Freq: Every day | ORAL | Status: DC | PRN

## 2011-12-02 MED ORDER — METHYLPHENIDATE HCL 10 MG PO TABS: 15.0000 mg | ORAL_TABLET | Freq: Two times a day (BID) | ORAL | Status: DC

## 2011-12-02 NOTE — Telephone Encounter (Signed)
Printed for Kirsteins to sign in O'Donnell absence.

## 2011-12-02 NOTE — Telephone Encounter (Signed)
Pt called requesting refill on her ritalin and her headache medicine with caffeine in it?

## 2011-12-02 NOTE — Telephone Encounter (Signed)
Rx are ready to be picked up, pt aware.

## 2011-12-03 ENCOUNTER — Encounter (INDEPENDENT_AMBULATORY_CARE_PROVIDER_SITE_OTHER): Payer: Self-pay | Admitting: General Surgery

## 2011-12-03 NOTE — Progress Notes (Signed)
Subjective:     Patient ID: Mallory Mendez, female   DOB: Aug 27, 1955, 56 y.o.   MRN: [id number]  HPI The patient is a 56 year old white female who actually had a ventral hernia repair with mesh done by Dr. Zachery Dakins in November of 2012. Since that time she has continued to have pain in her left abdomen at the site. She denies any fevers or chills. She denies any nausea or vomiting. Her bowels to move regularly. I believe she has a history of a head injury and is a poor historian.  Review of Systems  Constitutional: Negative.   HENT: Negative.   Eyes: Negative.   Respiratory: Negative.   Cardiovascular: Negative.   Gastrointestinal: Positive for abdominal pain.  Genitourinary: Negative.   Musculoskeletal: Negative.   Skin: Negative.   Neurological: Negative.   Hematological: Negative.   Psychiatric/Behavioral: Negative.        Objective:   Physical Exam  Constitutional: She is oriented to person, place, and time. She appears well-developed and well-nourished.  HENT:  Head: Normocephalic and atraumatic.  Eyes: Conjunctivae and EOM are normal. Pupils are equal, round, and reactive to light.  Neck: Normal range of motion. Neck supple.  Cardiovascular: Normal rate, regular rhythm and normal heart sounds.   Pulmonary/Chest: Effort normal and breath sounds normal.  Abdominal: Soft. Bowel sounds are normal.       She does have tenderness along her incisions. There is no palpable evidence of recurrence of the hernia. There is no evidence of seroma or infection. There is no abdominal distention.  Musculoskeletal: Normal range of motion.  Neurological: She is alert and oriented to person, place, and time.  Skin: Skin is warm and dry.  Psychiatric: She has a normal mood and affect. Her behavior is normal.       Assessment:     The patient does have pain in her operative site but I do not appreciate any evidence of recurrence of her hernia on physical exam.    Plan:     At this  point I would recommend getting a CT scan of her abdomen and pelvis to look for any radiographic evidence of a recurrent hernia and check the repair. We will order this and call with the results of the study.

## 2011-12-08 ENCOUNTER — Telehealth: Payer: Self-pay | Admitting: *Deleted

## 2011-12-08 NOTE — Telephone Encounter (Signed)
Didn't fill HA meds, pharmacy needs prior auth?

## 2011-12-08 NOTE — Telephone Encounter (Signed)
LM for pt to contact Pharmacy to initial prior auth.

## 2011-12-09 ENCOUNTER — Telehealth: Payer: Self-pay | Admitting: *Deleted

## 2011-12-09 NOTE — Telephone Encounter (Signed)
Headache medication will not be approved through her insurance. Will need an alternative.

## 2011-12-10 ENCOUNTER — Other Ambulatory Visit: Payer: Medicare Other

## 2011-12-10 NOTE — Telephone Encounter (Signed)
LM for pt to call back.

## 2011-12-10 NOTE — Telephone Encounter (Signed)
Which headache medicine?  i saw her 2 months ago and she's just now mentioning this?  Again, her lack of compliance with our plan makes it almost impossible to treat her

## 2011-12-13 ENCOUNTER — Other Ambulatory Visit: Payer: Medicare Other

## 2011-12-13 NOTE — Telephone Encounter (Signed)
LM for pt to call back.

## 2011-12-13 NOTE — Telephone Encounter (Signed)
Spoke with pt to get clarification on what mediation she needed.  Advised her to have her pharmacy contact us as she did not no what medication she needed.

## 2011-12-14 DIAGNOSIS — F09 Unspecified mental disorder due to known physiological condition: Secondary | ICD-10-CM | POA: Diagnosis not present

## 2011-12-14 DIAGNOSIS — F329 Major depressive disorder, single episode, unspecified: Secondary | ICD-10-CM | POA: Diagnosis not present

## 2011-12-14 DIAGNOSIS — F3289 Other specified depressive episodes: Secondary | ICD-10-CM | POA: Diagnosis not present

## 2011-12-15 NOTE — Telephone Encounter (Signed)
Pharmacy hasn't contacted [place]. Closing Encounter.

## 2011-12-17 ENCOUNTER — Ambulatory Visit
Admission: RE | Admit: 2011-12-17 | Discharge: 2011-12-17 | Disposition: A | Discharge status: Home / Self Care | Payer: Medicare Other | Source: Ambulatory Visit | Attending: General Surgery | Admitting: General Surgery

## 2011-12-17 ENCOUNTER — Other Ambulatory Visit: Payer: Medicare Other

## 2011-12-17 DIAGNOSIS — K429 Umbilical hernia without obstruction or gangrene: Secondary | ICD-10-CM | POA: Diagnosis not present

## 2011-12-17 DIAGNOSIS — R109 Unspecified abdominal pain: Secondary | ICD-10-CM | POA: Diagnosis not present

## 2011-12-17 MED ORDER — IOHEXOL 300 MG/ML  SOLN
100.0000 mL | Freq: Once | INTRAMUSCULAR | Status: AC | PRN
  Administered 2011-12-17: 100 mL via INTRAVENOUS

## 2011-12-20 ENCOUNTER — Ambulatory Visit (INDEPENDENT_AMBULATORY_CARE_PROVIDER_SITE_OTHER): Payer: Medicaid Other | Admitting: General Surgery

## 2011-12-20 ENCOUNTER — Encounter (INDEPENDENT_AMBULATORY_CARE_PROVIDER_SITE_OTHER): Payer: Self-pay | Admitting: General Surgery

## 2011-12-20 VITALS — BP 122/86 | HR 80 | Temp 98.0°F | Resp 16 | Ht 63.5 in | Wt 156.8 lb

## 2011-12-20 DIAGNOSIS — R109 Unspecified abdominal pain: Secondary | ICD-10-CM | POA: Diagnosis not present

## 2011-12-28 DIAGNOSIS — F3289 Other specified depressive episodes: Secondary | ICD-10-CM | POA: Diagnosis not present

## 2011-12-28 DIAGNOSIS — F09 Unspecified mental disorder due to known physiological condition: Secondary | ICD-10-CM | POA: Diagnosis not present

## 2011-12-28 DIAGNOSIS — F329 Major depressive disorder, single episode, unspecified: Secondary | ICD-10-CM | POA: Diagnosis not present

## 2011-12-30 ENCOUNTER — Other Ambulatory Visit: Payer: Self-pay | Admitting: *Deleted

## 2011-12-30 MED ORDER — TOPIRAMATE 100 MG PO TABS: 100.0000 mg | ORAL_TABLET | Freq: Every day | ORAL | Status: DC

## 2011-12-31 ENCOUNTER — Encounter: Payer: Medicare Other | Attending: Physical Medicine & Rehabilitation | Admitting: Physical Medicine & Rehabilitation

## 2012-01-03 ENCOUNTER — Other Ambulatory Visit (HOSPITAL_COMMUNITY): Payer: Self-pay | Admitting: Interventional Radiology

## 2012-01-03 DIAGNOSIS — I729 Aneurysm of unspecified site: Secondary | ICD-10-CM

## 2012-01-05 ENCOUNTER — Encounter (INDEPENDENT_AMBULATORY_CARE_PROVIDER_SITE_OTHER): Payer: Self-pay | Admitting: General Surgery

## 2012-01-05 NOTE — Progress Notes (Signed)
Subjective:     Patient ID: Mallory Mendez, female   DOB: [birth year], 56 y.o.   MRN: [id number]  HPI The patient is a 56 year old white female who had a ventral hernia repair with mesh done by Dr. Zachery Dakins in November of 2012. Since that time she has continued to have some pain of her left abdominal wall. She does not appear to have nausea or vomiting. She is somewhat of a poor historian. Since her last visit we had her undergo a CT scan of her abdomen and pelvis which showed no evidence of recurrence of her hernia.  Review of Systems     Objective:   Physical Exam  Constitutional: She is oriented to person, place, and time. She appears well-developed and well-nourished.  HENT:  Head: Normocephalic and atraumatic.  Eyes: Conjunctivae and EOM are normal. Pupils are equal, round, and reactive to light.  Neck: Normal range of motion. Neck supple.  Cardiovascular: Normal rate, regular rhythm and normal heart sounds.   Pulmonary/Chest: Effort normal and breath sounds normal.  Abdominal: Soft. Bowel sounds are normal.       Her abdomen is tender on the left side. There is no palpable evidence for recurrence of the hernia. She does have a lot of scar tissue.  Musculoskeletal: Normal range of motion.  Neurological: She is alert and oriented to person, place, and time.  Skin: Skin is warm and dry.  Psychiatric: She has a normal mood and affect. Her behavior is normal.       She seems to be a poor historian       Assessment:     She is status post ventral hernia repair with mesh in November of 2012. At this point I cannot appreciate clinically a recurrence of her hernia and her CT scan also shows no evidence of recurrence of the hernia. Unfortunately I do not feel there is anything surgically that can be done to improve her pain.    Plan:     At this point we will turn her back over to her medical doctors for management of her pain. We will see her back on a when necessary basis.

## 2012-01-07 ENCOUNTER — Encounter: Payer: Self-pay | Admitting: Physical Medicine & Rehabilitation

## 2012-01-07 ENCOUNTER — Encounter: Payer: Medicare Other | Admitting: Physical Medicine & Rehabilitation

## 2012-01-11 ENCOUNTER — Other Ambulatory Visit: Payer: Self-pay | Admitting: Radiology

## 2012-01-20 ENCOUNTER — Encounter (HOSPITAL_COMMUNITY): Payer: Self-pay | Admitting: Pharmacist

## 2012-01-21 ENCOUNTER — Other Ambulatory Visit (HOSPITAL_COMMUNITY): Payer: Self-pay | Admitting: Interventional Radiology

## 2012-01-21 ENCOUNTER — Encounter (HOSPITAL_COMMUNITY): Payer: Self-pay

## 2012-01-21 ENCOUNTER — Ambulatory Visit (HOSPITAL_COMMUNITY)
Admission: RE | Admit: 2012-01-21 | Discharge: 2012-01-21 | Disposition: A | Discharge status: Home / Self Care | Payer: Medicare Other | Source: Ambulatory Visit | Attending: Interventional Radiology | Admitting: Interventional Radiology

## 2012-01-21 DIAGNOSIS — K219 Gastro-esophageal reflux disease without esophagitis: Secondary | ICD-10-CM | POA: Insufficient documentation

## 2012-01-21 DIAGNOSIS — I729 Aneurysm of unspecified site: Secondary | ICD-10-CM

## 2012-01-21 DIAGNOSIS — E785 Hyperlipidemia, unspecified: Secondary | ICD-10-CM | POA: Insufficient documentation

## 2012-01-21 DIAGNOSIS — I671 Cerebral aneurysm, nonruptured: Secondary | ICD-10-CM | POA: Insufficient documentation

## 2012-01-21 DIAGNOSIS — R51 Headache: Secondary | ICD-10-CM | POA: Insufficient documentation

## 2012-01-21 DIAGNOSIS — Z09 Encounter for follow-up examination after completed treatment for conditions other than malignant neoplasm: Secondary | ICD-10-CM | POA: Insufficient documentation

## 2012-01-21 DIAGNOSIS — R5381 Other malaise: Secondary | ICD-10-CM | POA: Diagnosis not present

## 2012-01-21 DIAGNOSIS — I10 Essential (primary) hypertension: Secondary | ICD-10-CM | POA: Insufficient documentation

## 2012-01-21 DIAGNOSIS — F17200 Nicotine dependence, unspecified, uncomplicated: Secondary | ICD-10-CM | POA: Diagnosis not present

## 2012-01-21 DIAGNOSIS — R4789 Other speech disturbances: Secondary | ICD-10-CM | POA: Diagnosis not present

## 2012-01-21 DIAGNOSIS — E119 Type 2 diabetes mellitus without complications: Secondary | ICD-10-CM | POA: Diagnosis not present

## 2012-01-21 DIAGNOSIS — R5383 Other fatigue: Secondary | ICD-10-CM | POA: Diagnosis not present

## 2012-01-21 DIAGNOSIS — Z9889 Other specified postprocedural states: Secondary | ICD-10-CM | POA: Insufficient documentation

## 2012-01-21 LAB — BASIC METABOLIC PANEL
BUN: 15 mg/dL (ref 6–23)
Calcium: 9.2 mg/dL (ref 8.4–10.5)
Creatinine, Ser: 0.59 mg/dL (ref 0.50–1.10)
GFR calc Af Amer: >90 mL/min (ref >90)
GFR calc non Af Amer: >90 mL/min (ref >90)

## 2012-01-21 LAB — DIFFERENTIAL
Basophils Absolute: 0 10*3/uL (ref 0.0–0.1)
Basophils Relative: 0 % (ref 0–1)
Monocytes Absolute: 0.6 10*3/uL (ref 0.1–1.0)
Neutro Abs: 4.6 10*3/uL (ref 1.7–7.7)
Neutrophils Relative %: 57 % (ref 43–77)

## 2012-01-21 LAB — CBC
HCT: 38.6 % (ref 36.0–46.0)
MCHC: 34.2 g/dL (ref 30.0–36.0)
RDW: 14 % (ref 11.5–15.5)

## 2012-01-21 MED ORDER — SODIUM CHLORIDE 0.9 % IV SOLN: INTRAVENOUS | Status: AC

## 2012-01-21 MED ORDER — FENTANYL CITRATE 0.05 MG/ML IJ SOLN
INTRAMUSCULAR | Status: AC
  Filled 2012-01-21: qty 4

## 2012-01-21 MED ORDER — FLUMAZENIL 0.5 MG/5ML IV SOLN
INTRAVENOUS | Status: AC | PRN
  Administered 2012-01-21: 0.2 mg via INTRAVENOUS

## 2012-01-21 MED ORDER — HEPARIN SOD (PORK) LOCK FLUSH 100 UNIT/ML IV SOLN
INTRAVENOUS | Status: AC | PRN
  Administered 2012-01-21: 500 [IU] via INTRAVENOUS

## 2012-01-21 MED ORDER — FENTANYL CITRATE 0.05 MG/ML IJ SOLN
INTRAMUSCULAR | Status: AC
  Filled 2012-01-21: qty 2

## 2012-01-21 MED ORDER — SODIUM CHLORIDE 0.9 % IV SOLN: Freq: Once | INTRAVENOUS | Status: DC

## 2012-01-21 MED ORDER — MIDAZOLAM HCL 5 MG/5ML IJ SOLN
INTRAMUSCULAR | Status: AC | PRN
  Administered 2012-01-21: 1 mg via INTRAVENOUS

## 2012-01-21 MED ORDER — MIDAZOLAM HCL 2 MG/2ML IJ SOLN
INTRAMUSCULAR | Status: AC
  Filled 2012-01-21: qty 2

## 2012-01-21 MED ORDER — IOHEXOL 300 MG/ML  SOLN
150.0000 mL | Freq: Once | INTRAMUSCULAR | Status: AC | PRN
  Administered 2012-01-21: 65 mL via INTRA_ARTERIAL

## 2012-01-21 MED ORDER — MIDAZOLAM HCL 2 MG/2ML IJ SOLN
INTRAMUSCULAR | Status: AC
  Filled 2012-01-21: qty 4

## 2012-01-21 MED ORDER — FENTANYL CITRATE 0.05 MG/ML IJ SOLN
INTRAMUSCULAR | Status: AC | PRN
  Administered 2012-01-21: 12.5 ug via INTRAVENOUS

## 2012-01-21 MED ORDER — FLUMAZENIL 0.5 MG/5ML IV SOLN
INTRAVENOUS | Status: AC
  Filled 2012-01-21: qty 5

## 2012-01-21 NOTE — Procedures (Signed)
S/P 4 vessel cerebral arteriogram. RT CFA approach. Preliminary findings. 1. Obliterated basilar aretry aneurysm

## 2012-01-21 NOTE — H&P (Signed)
Mallory Mendez is an 56 y.o. female.   Chief Complaint: basilar artery aneurysm coil 07/2010 Head/brain injury 2 yrs ago- before aneurysm discovery or treatment Scheduled now for re check cerebral arteriogram Still with daily headaches and slurred/ slow speech HPI: DM; hyperlipidemia; HTN; previous concussion; GERD; still smoking less than 1 ppd  Past Medical History  Diagnosis Date  . Diabetes mellitus   . Hyperlipidemia   . Blood transfusion   . Arthritis   . History of back surgery     multiple  . Hypertension   . Hernia     incisional  . Wears glasses   . MRSA (methicillin resistant Staphylococcus aureus)   . Full dentures   . Chronic constipation   . Umbilical hernia   . Headache   . Dizziness   . Concussion     HAS HAD DIZZINESS/ MEMORY PROBLEMS/ SLOW SPEACH /BALANCE PROBLEMS SINCE CONCUSSION 1 1/2 YR AGO PR GOES TO THERAPY  . Fibromyalgia   . Scar   . GERD (gastroesophageal reflux disease)   . Chronic stomach ulcer   . Nocturia   . Sleep difficulties   . Depression   . Anxiety   . Brain injury     1 1/2 YR AGO  . Concussion   . Diabetes mellitus   . Paroxysmal vertigo     Past Surgical History  Procedure Date  . Cholecystectomy 1984  . Spinal fusion 2006  . Eye surgery 2010    bilateral  . Abdominal hysterectomy   . Cerebral aneurysm repair   . Back surgery     SPINAL FUSION X6   . Incisional hernia repair 05/19/2011    Procedure: HERNIA REPAIR INCISIONAL;  Surgeon: Iona Coach, MD;  Location: [facility] ORS;  Service: General;  Laterality: N/A;  repair incisional hernia with mesh  . Hernia repair     09/21/2010  . Hernia repair 05/19/11    incisional hernia repair     Family History  Problem Relation Age of Onset  . Cancer Mother     breast   Social History:  reports that she has been smoking.  She has never used smokeless tobacco. She reports that she does not drink alcohol or use illicit drugs.  Allergies:  Allergies  Allergen Reactions    . Cymbalta (Duloxetine Hcl) Anaphylaxis  . Latex Anaphylaxis     (Not in a hospital admission)  Results for orders placed during the hospital encounter of 01/21/12 (from the past 48 hour(s))  APTT     Status: Normal   Collection Time   01/21/12  8:50 AM      Component Value Range Comment   aPTT 32  24 - 37 seconds   CBC     Status: Normal   Collection Time   01/21/12  8:50 AM      Component Value Range Comment   WBC 8.1  4.0 - 10.5 K/uL    RBC 4.67  3.87 - 5.11 MIL/uL    Hemoglobin 13.2  12.0 - 15.0 g/dL    HCT 78.2  95.6 - 21.3 %    MCV 82.7  78.0 - 100.0 fL    MCH 28.3  26.0 - 34.0 pg    MCHC 34.2  30.0 - 36.0 g/dL    RDW 08.6  57.8 - 46.9 %    Platelets 180  150 - 400 K/uL   DIFFERENTIAL     Status: Normal   Collection Time   01/21/12  8:50  AM      Component Value Range Comment   Neutrophils Relative 57  43 - 77 %    Neutro Abs 4.6  1.7 - 7.7 K/uL    Lymphocytes Relative 33  12 - 46 %    Lymphs Abs 2.6  0.7 - 4.0 K/uL    Monocytes Relative 7  3 - 12 %    Monocytes Absolute 0.6  0.1 - 1.0 K/uL    Eosinophils Relative 3  0 - 5 %    Eosinophils Absolute 0.2  0.0 - 0.7 K/uL    Basophils Relative 0  0 - 1 %    Basophils Absolute 0.0  0.0 - 0.1 K/uL   PROTIME-INR     Status: Normal   Collection Time   01/21/12  8:50 AM      Component Value Range Comment   Prothrombin Time 13.9  11.6 - 15.2 seconds    INR 1.05  0.00 - 1.49    No results found.  Review of Systems  Constitutional: Negative for fever.  HENT: Positive for neck pain.   Eyes: Positive for blurred vision and photophobia.  Respiratory: Negative for shortness of breath.   Cardiovascular: Negative for chest pain.  Gastrointestinal: Negative for nausea, vomiting and abdominal pain.  Neurological: Positive for dizziness and headaches.  Psychiatric/Behavioral: The patient is nervous/anxious.     Blood pressure 131/86, pulse 73, temperature 97.5 F (36.4 C), temperature source Oral, resp. rate 18, height 5'  3" (1.6 m), weight 139 lb (63.05 kg), SpO2 100.00%. Physical Exam  Constitutional: She is oriented to person, place, and time. She appears well-developed and well-nourished.  HENT:  Head: Normocephalic.  Eyes: EOM are normal.  Neck: Normal range of motion.       Back of neck and head ache- off and on always  Cardiovascular: Normal rate, regular rhythm and normal heart sounds.   No murmur heard. Respiratory: Effort normal and breath sounds normal. She has no wheezes.  GI: Soft. Bowel sounds are normal. There is no tenderness.  Musculoskeletal: Normal range of motion.  Neurological: She is alert and oriented to person, place, and time.       Pt seems to slur words some; says since head injury 2 yrs ago - before aneurysm  Skin: Skin is warm and dry.  Psychiatric: She has a normal mood and affect. Her behavior is normal.       Seems slightlymildly confused     Assessment/Plan Basilar artery aneurysm coiling 07/2010 Still with headaches daily; speech abnormality Mild confusion; still smoking Scheduled for cerebral arteriogram in follow up Pt aware of procedure benefits and risks and agreeable to proceed. Consent signed.  Mallory Mendez A 01/21/2012, 9:33 AM

## 2012-01-21 NOTE — ED Notes (Signed)
Sheath pulled.  5 Fr Exoseal closure by [employer] RT.

## 2012-01-21 NOTE — ED Notes (Signed)
O2 2L/[place] started 

## 2012-01-21 NOTE — ED Notes (Signed)
Left leg strength better, pt reports no change from her usual.

## 2012-01-21 NOTE — ED Notes (Signed)
Left leg weak.  Pt reports left leg weakness at home but that it may possibly be worse now.  Difficult to determine.

## 2012-01-21 NOTE — ED Notes (Signed)
MD at bedside. 

## 2012-01-21 NOTE — Discharge Instructions (Signed)

## 2012-01-21 NOTE — ED Notes (Signed)
C/O headache, requesting pain med.  Instructed we will be beginning case in a few minutes and pain med would come then.  Dr D to speak to pt prior to sedation.

## 2012-01-21 NOTE — ED Notes (Signed)
Left leg movement better.

## 2012-01-24 ENCOUNTER — Ambulatory Visit: Payer: Medicare Other | Admitting: Physical Medicine & Rehabilitation

## 2012-01-24 ENCOUNTER — Telehealth: Payer: Self-pay | Admitting: *Deleted

## 2012-01-24 NOTE — Telephone Encounter (Signed)
Unequivocally, the next no show means discharge. No exceptions

## 2012-01-24 NOTE — Telephone Encounter (Signed)
Mallory Mendez called to verify pt appointment and she was told that pt had been discharged. She wants clarification on this.  I spoke to [place] and let her know why she was discharged and she advised me that pt didn't know that she had appointments that she missed. Her and her husband have split up and with her condition she can't remember her appointments. Her friend Mallory Mendez is willing to keep up with all appointments and make sure she comes to them. Will you reconsider letting her come back?

## 2012-01-25 NOTE — Telephone Encounter (Signed)
Mallory Mendez is aware and guarantees that Mallory Mendez will come to every appointment.

## 2012-01-28 DIAGNOSIS — M545 Low back pain, unspecified: Secondary | ICD-10-CM | POA: Diagnosis not present

## 2012-01-28 DIAGNOSIS — Z79899 Other long term (current) drug therapy: Secondary | ICD-10-CM | POA: Diagnosis not present

## 2012-01-28 DIAGNOSIS — R51 Headache: Secondary | ICD-10-CM | POA: Diagnosis not present

## 2012-02-02 ENCOUNTER — Encounter: Payer: Medicare Other | Attending: Physical Medicine & Rehabilitation | Admitting: Physical Medicine & Rehabilitation

## 2012-02-02 ENCOUNTER — Encounter: Payer: Self-pay | Admitting: Physical Medicine & Rehabilitation

## 2012-02-02 VITALS — BP 135/69 | HR 84 | Resp 14 | Ht 64.0 in | Wt 155.0 lb

## 2012-02-02 DIAGNOSIS — K429 Umbilical hernia without obstruction or gangrene: Secondary | ICD-10-CM | POA: Diagnosis not present

## 2012-02-02 DIAGNOSIS — R42 Dizziness and giddiness: Secondary | ICD-10-CM

## 2012-02-02 DIAGNOSIS — H8110 Benign paroxysmal vertigo, unspecified ear: Secondary | ICD-10-CM | POA: Diagnosis not present

## 2012-02-02 DIAGNOSIS — F0781 Postconcussional syndrome: Secondary | ICD-10-CM | POA: Diagnosis not present

## 2012-02-02 DIAGNOSIS — S060X9A Concussion with loss of consciousness of unspecified duration, initial encounter: Secondary | ICD-10-CM | POA: Diagnosis not present

## 2012-02-02 DIAGNOSIS — R109 Unspecified abdominal pain: Secondary | ICD-10-CM

## 2012-02-02 DIAGNOSIS — S060XAA Concussion with loss of consciousness status unknown, initial encounter: Secondary | ICD-10-CM | POA: Diagnosis not present

## 2012-02-02 DIAGNOSIS — E119 Type 2 diabetes mellitus without complications: Secondary | ICD-10-CM

## 2012-02-02 MED ORDER — METHYLPHENIDATE HCL 10 MG PO TABS: 15.0000 mg | ORAL_TABLET | Freq: Two times a day (BID) | ORAL | Status: DC

## 2012-02-02 MED ORDER — GABAPENTIN 600 MG PO TABS: 600.0000 mg | ORAL_TABLET | Freq: Three times a day (TID) | ORAL | Status: DC

## 2012-02-02 NOTE — Patient Instructions (Signed)
Use a walker around the house for safety
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
## 2012-02-09 DIAGNOSIS — R32 Unspecified urinary incontinence: Secondary | ICD-10-CM | POA: Diagnosis not present

## 2012-02-24 DIAGNOSIS — H524 Presbyopia: Secondary | ICD-10-CM | POA: Diagnosis not present

## 2012-02-24 DIAGNOSIS — E1139 Type 2 diabetes mellitus with other diabetic ophthalmic complication: Secondary | ICD-10-CM | POA: Diagnosis not present

## 2012-02-24 DIAGNOSIS — E11319 Type 2 diabetes mellitus with unspecified diabetic retinopathy without macular edema: Secondary | ICD-10-CM | POA: Diagnosis not present

## 2012-03-07 DIAGNOSIS — Z23 Encounter for immunization: Secondary | ICD-10-CM | POA: Diagnosis not present

## 2012-04-03 ENCOUNTER — Encounter: Payer: Medicare Other | Attending: Physical Medicine & Rehabilitation | Admitting: Physical Medicine & Rehabilitation

## 2012-04-03 ENCOUNTER — Encounter: Payer: Self-pay | Admitting: Physical Medicine & Rehabilitation

## 2012-04-03 VITALS — BP 155/95 | HR 85 | Resp 16 | Ht 63.0 in | Wt 154.0 lb

## 2012-04-03 DIAGNOSIS — Z79899 Other long term (current) drug therapy: Secondary | ICD-10-CM | POA: Diagnosis not present

## 2012-04-03 DIAGNOSIS — F0781 Postconcussional syndrome: Secondary | ICD-10-CM | POA: Diagnosis not present

## 2012-04-03 DIAGNOSIS — H8110 Benign paroxysmal vertigo, unspecified ear: Secondary | ICD-10-CM | POA: Diagnosis not present

## 2012-04-03 DIAGNOSIS — R42 Dizziness and giddiness: Secondary | ICD-10-CM | POA: Diagnosis not present

## 2012-04-03 DIAGNOSIS — M4120 Other idiopathic scoliosis, site unspecified: Secondary | ICD-10-CM | POA: Diagnosis not present

## 2012-04-03 DIAGNOSIS — M545 Low back pain, unspecified: Secondary | ICD-10-CM | POA: Diagnosis not present

## 2012-04-03 DIAGNOSIS — R51 Headache: Secondary | ICD-10-CM | POA: Diagnosis not present

## 2012-04-03 MED ORDER — METHYLPHENIDATE HCL 10 MG PO TABS: 15.0000 mg | ORAL_TABLET | Freq: Two times a day (BID) | ORAL | Status: DC

## 2012-04-03 MED ORDER — RIVASTIGMINE TARTRATE 1.5 MG PO CAPS: 1.5000 mg | ORAL_CAPSULE | Freq: Two times a day (BID) | ORAL | Status: DC

## 2012-04-03 NOTE — Patient Instructions (Signed)
1. DECREASE YOUR GABAPENTIN TO 600MG  TWICE DAILY  2. BEGIN EXELON 1.5MG  TWICE DAILY FOR YOUR MEMORY  3. CALL THERAPY AND ASK THEM IF THEY DO VESTIBULAR REHAB (FOR YOUR VERTIGO)  4. YOU NEED TO USE YOUR MEMORY BOOK/CALENDAR DAILY!!!!

## 2012-04-03 NOTE — Progress Notes (Signed)
Subjective:    Patient ID: Mallory Mendez, female    DOB: 1955-09-25, 56 y.o.   MRN: [id number]  HPI Rennie is back regarding her postconcussion syndrome and associated deficits. She continues to struggle with balance but never purchased the walker we discussed. She in fact didn't recall talking about the walker. She continues to furniture walk around the house. She is taking the meclizine and this may be helping a bit. She hasn't continued with her vestibular exercises. She complains of low back pain and headache which have been managed by Dr. Gerilyn Pilgrim. She continues on the rtialin which helps her attention. She has a calendar but is not faithfully using it.   She tells me that her mother is sick and dying of cancer and this has added extra stress for her once again.   Pain Inventory Average Pain 7 Pain Right Now 5 My pain is aching  In the last 24 hours, has pain interfered with the following? General activity 7 Relation with others 6 Enjoyment of life 6 What TIME of day is your pain at its worst? evening Sleep (in general) Fair  Pain is worse with: walking, bending, standing and some activites Pain improves with: rest Relief from Meds: 4  Mobility walk with assistance use a walker ability to climb steps?  no do you drive?  yes  Function not employed: date last employed  I need assistance with the following:  dressing, meal prep, household duties and shopping  Neuro/Psych bladder control problems weakness numbness tingling trouble walking dizziness confusion  Prior Studies Any changes since last visit?  no  Physicians involved in your care Any changes since last visit?  no   Family History  Problem Relation Age of Onset  . Cancer Mother     breast   History   Social History  . Marital Status: Married    Spouse Name: N/A    Number of Children: N/A  . Years of Education: N/A   Social History Main Topics  . Smoking status: Current Every Day Smoker --  1.0 packs/day  . Smokeless tobacco: Never Used  . Alcohol Use: No  . Drug Use: No  . Sexually Active: None   Other Topics Concern  . None   Social History Narrative  . None   Past Surgical History  Procedure Date  . Cholecystectomy 1984  . Spinal fusion 2006  . Eye surgery 2010    bilateral  . Abdominal hysterectomy   . Cerebral aneurysm repair   . Back surgery     SPINAL FUSION X6   . Incisional hernia repair 05/19/2011    Procedure: HERNIA REPAIR INCISIONAL;  Surgeon: Iona Coach, MD;  Location: [facility];  Service: General;  Laterality: N/A;  repair incisional hernia with mesh  . Hernia repair     09/21/2010  . Hernia repair 05/19/11    incisional hernia repair    Past Medical History  Diagnosis Date  . Diabetes mellitus   . Hyperlipidemia   . Blood transfusion   . Arthritis   . History of back surgery     multiple  . Hypertension   . Hernia     incisional  . Wears glasses   . MRSA (methicillin resistant Staphylococcus aureus)   . Full dentures   . Chronic constipation   . Umbilical hernia   . Headache   . Dizziness   . Concussion     HAS HAD DIZZINESS/ MEMORY PROBLEMS/ SLOW [facility] /BALANCE PROBLEMS SINCE  CONCUSSION 1 1/2 YR AGO PR GOES TO THERAPY  . Fibromyalgia   . Scar   . GERD (gastroesophageal reflux disease)   . Chronic stomach ulcer   . Nocturia   . Sleep difficulties   . Depression   . Anxiety   . Brain injury     1 1/2 YR AGO  . Concussion   . Diabetes mellitus   . Paroxysmal vertigo    BP 155/95  Pulse 85  Resp 16  Ht 5\' 3"  (1.6 m)  Wt 154 lb (69.854 kg)  BMI 27.28 kg/m2  SpO2 100%      Review of Systems  Constitutional: Negative.   HENT: Negative.   Eyes: Negative.   Respiratory: Negative.   Cardiovascular: Negative.   Gastrointestinal: Positive for nausea.  Genitourinary: Negative.   Musculoskeletal: Positive for myalgias and arthralgias.  Skin: Negative.   Psychiatric/Behavioral: Positive for confusion.         Objective:   Physical Exam  Constitutional: She is oriented to person, place, and time. She appears well-developed and well-nourished.  HENT:  Head: Normocephalic and atraumatic.  Eyes: Conjunctivae and EOM are normal. Pupils are equal, round, and reactive to light.  Neck: Normal range of motion.  Cardiovascular: Normal rate and regular rhythm.  Pulmonary/Chest: Effort normal.  Abdominal: Soft.  Neurological: She is alert and oriented to person, place, and time. She has normal strength. A sensory deficit is present. No cranial nerve deficit.  Balance is still imparied and she uses the walls for balance. With any type of gaze, she begins to experience vertigo. There is some nystagmus, but she guards against it and resists end-gaze positions.  Skin: Skin is warm.  Old bruising on left shin. Periosteum of tibia is sensitive to touch.  Psychiatric: Her speech is normal. Judgment and thought processing were much improved. Her attention and awarenss are better but remain impaired. She has difficulty staying focused during simple commands and tasks. She still has some short term memory issues..  Assessment & Plan:   ASSESSMENT:  1. Postconcussion syndrome with positional vertigo and persistent  vestibular symptoms, attention deficit, memory and sleep  dysfunction, anxiety, and depression. There continue to be large family psychosocial factors at play. There always appears to be some kind of crisis taking place in her life at any given time. 2. Chronic low back pain.   PLAN:  1. We will stay with Ritalin at the 7:00 am and 12 noon dosing for  now. 15mg  (A second rx was given for next month) Also will Add exelon for memory 1,5mg  bid 2. Continue klonopin 1mg  at beditme. 4.We will stay with Topamax for headaches for now at 100 mg at bedtime dose.  5. Discussed the importance of vestibular rehab to address her BPPV. Made a referral ONCE AGIN for vestibular rehab.  I also decreased neurontin to 600mg   again this time to BID to see if this helps with balance and cognition. A decrease in topamax also would help, but i feel this has decreased her headache intensity quite a bit.. 6. Pain mgt (back) per Dr. Gerilyn Pilgrim  7. I'll see the patient back in 2 months. Extensive time was spent again on education and describing instructions which were printed out for her. Her daughter was also present but she appeared quite disinterested. Again, I told her that i would not tolerate any further no shows

## 2012-04-04 ENCOUNTER — Other Ambulatory Visit: Payer: Self-pay

## 2012-04-04 ENCOUNTER — Other Ambulatory Visit: Payer: Self-pay | Admitting: *Deleted

## 2012-04-04 MED ORDER — TOPIRAMATE 100 MG PO TABS: 100.0000 mg | ORAL_TABLET | Freq: Every day | ORAL | Status: DC

## 2012-05-02 DIAGNOSIS — M545 Low back pain, unspecified: Secondary | ICD-10-CM | POA: Diagnosis not present

## 2012-05-02 DIAGNOSIS — M4120 Other idiopathic scoliosis, site unspecified: Secondary | ICD-10-CM | POA: Diagnosis not present

## 2012-05-02 DIAGNOSIS — Z79899 Other long term (current) drug therapy: Secondary | ICD-10-CM | POA: Diagnosis not present

## 2012-05-02 DIAGNOSIS — R51 Headache: Secondary | ICD-10-CM | POA: Diagnosis not present

## 2012-05-04 ENCOUNTER — Other Ambulatory Visit: Payer: Self-pay

## 2012-05-04 DIAGNOSIS — R42 Dizziness and giddiness: Secondary | ICD-10-CM

## 2012-05-04 DIAGNOSIS — F0781 Postconcussional syndrome: Secondary | ICD-10-CM

## 2012-05-04 MED ORDER — TOPIRAMATE 100 MG PO TABS: 100.0000 mg | ORAL_TABLET | Freq: Every day | ORAL | Status: DC

## 2012-05-04 MED ORDER — GABAPENTIN 600 MG PO TABS: 600.0000 mg | ORAL_TABLET | Freq: Three times a day (TID) | ORAL | Status: DC

## 2012-05-23 DIAGNOSIS — R51 Headache: Secondary | ICD-10-CM | POA: Diagnosis not present

## 2012-05-23 DIAGNOSIS — M545 Low back pain, unspecified: Secondary | ICD-10-CM | POA: Diagnosis not present

## 2012-05-23 DIAGNOSIS — M4120 Other idiopathic scoliosis, site unspecified: Secondary | ICD-10-CM | POA: Diagnosis not present

## 2012-05-23 DIAGNOSIS — Z79899 Other long term (current) drug therapy: Secondary | ICD-10-CM | POA: Diagnosis not present

## 2012-05-30 ENCOUNTER — Encounter: Payer: Self-pay | Admitting: Physical Medicine & Rehabilitation

## 2012-05-30 ENCOUNTER — Encounter: Payer: Medicare Other | Attending: Physical Medicine & Rehabilitation | Admitting: Physical Medicine & Rehabilitation

## 2012-05-30 VITALS — BP 131/71 | HR 92 | Resp 14 | Ht 63.0 in | Wt 150.0 lb

## 2012-05-30 DIAGNOSIS — F341 Dysthymic disorder: Secondary | ICD-10-CM | POA: Diagnosis not present

## 2012-05-30 DIAGNOSIS — F419 Anxiety disorder, unspecified: Secondary | ICD-10-CM | POA: Insufficient documentation

## 2012-05-30 DIAGNOSIS — H8110 Benign paroxysmal vertigo, unspecified ear: Secondary | ICD-10-CM

## 2012-05-30 DIAGNOSIS — R42 Dizziness and giddiness: Secondary | ICD-10-CM

## 2012-05-30 DIAGNOSIS — F0781 Postconcussional syndrome: Secondary | ICD-10-CM

## 2012-05-30 DIAGNOSIS — F32A Depression, unspecified: Secondary | ICD-10-CM

## 2012-05-30 DIAGNOSIS — F329 Major depressive disorder, single episode, unspecified: Secondary | ICD-10-CM | POA: Insufficient documentation

## 2012-05-30 MED ORDER — METHYLPHENIDATE HCL 10 MG PO TABS: 15.0000 mg | ORAL_TABLET | Freq: Two times a day (BID) | ORAL | Status: DC

## 2012-05-30 MED ORDER — GABAPENTIN 600 MG PO TABS: 300.0000 mg | ORAL_TABLET | Freq: Two times a day (BID) | ORAL | Status: DC

## 2012-05-30 MED ORDER — TOPIRAMATE 100 MG PO TABS: 150.0000 mg | ORAL_TABLET | Freq: Every day | ORAL | Status: DC

## 2012-05-30 NOTE — Patient Instructions (Addendum)
  TOPAMAX: TAKE 1.5 TABS AT BEDTIME   GABAPENTIN (NEURONTIN): TAKE 1/2 TAB TWICE DAILY

## 2012-05-30 NOTE — Progress Notes (Signed)
Subjective:    Patient ID: Mallory Mendez, female    DOB: Oct 26, 1955, 56 y.o.   MRN: [id number]  HPI  Mallory Mendez is back regarding her headaces and PCS. She states her headaches have been worse over the last 3 weeks. She is taking her other medicines as prescribed and feels that they have provided some benfit. The exelon has helped with her memory she believes.   She has not gone to PT as she could not find a place which provides vestibular rehab near where she lives.  Pain Inventory Average Pain 6 Pain Right Now 5 My pain is aching  In the last 24 hours, has pain interfered with the following? General activity 4 Relation with others 4 Enjoyment of life 5 What TIME of day is your pain at its worst? daytime Sleep (in general) Fair  Pain is worse with: walking, sitting, standing and some activites Pain improves with: rest and TENS Relief from Meds: 5  Mobility walk with assistance use a walker ability to climb steps?  no needs help with transfers  Function disabled: date disabled 60 I need assistance with the following:  household duties and shopping  Neuro/Psych bladder control problems weakness numbness dizziness confusion  Prior Studies Any changes since last visit?  no  Physicians involved in your care Any changes since last visit?  no   Family History  Problem Relation Age of Onset  . Cancer Mother     breast   History   Social History  . Marital Status: Married    Spouse Name: N/A    Number of Children: N/A  . Years of Education: N/A   Social History Main Topics  . Smoking status: Current Every Day Smoker -- 1.0 packs/day  . Smokeless tobacco: Never Used  . Alcohol Use: No  . Drug Use: No  . Sexually Active: None   Other Topics Concern  . None   Social History Narrative  . None   Past Surgical History  Procedure Date  . Cholecystectomy 1984  . Spinal fusion 2006  . Eye surgery 2010    bilateral  . Abdominal hysterectomy   . Cerebral  aneurysm repair   . Back surgery     SPINAL FUSION X6   . Incisional hernia repair 05/19/2011    Procedure: HERNIA REPAIR INCISIONAL;  Surgeon: Iona Coach, MD;  Location: [facility];  Service: General;  Laterality: N/A;  repair incisional hernia with mesh  . Hernia repair     09/21/2010  . Hernia repair 05/19/11    incisional hernia repair    Past Medical History  Diagnosis Date  . Diabetes mellitus   . Hyperlipidemia   . Blood transfusion   . Arthritis   . History of back surgery     multiple  . Hypertension   . Hernia     incisional  . Wears glasses   . MRSA (methicillin resistant Staphylococcus aureus)   . Full dentures   . Chronic constipation   . Umbilical hernia   . Headache   . Dizziness   . Concussion     HAS HAD DIZZINESS/ MEMORY PROBLEMS/ SLOW SPEACH /BALANCE PROBLEMS SINCE CONCUSSION 1 1/2 YR AGO PR GOES TO THERAPY  . Fibromyalgia   . Scar   . GERD (gastroesophageal reflux disease)   . Chronic stomach ulcer   . Nocturia   . Sleep difficulties   . Depression   . Anxiety   . Brain injury     1  1/2 YR AGO  . Concussion   . Diabetes mellitus   . Paroxysmal vertigo    BP 131/71  Pulse 92  Resp 14  Ht 5\' 3"  (1.6 m)  Wt 150 lb (68.04 kg)  BMI 26.57 kg/m2  SpO2 100%     Review of Systems  Respiratory: Positive for cough.   Gastrointestinal: Positive for nausea.  Musculoskeletal: Positive for myalgias, back pain, arthralgias and gait problem.  Neurological: Positive for dizziness, weakness and numbness.  Psychiatric/Behavioral: Positive for confusion.  All other systems reviewed and are negative.       Objective:   Physical Exam Constitutional: She is oriented to person, place, and time. She appears well-developed and well-nourished.  HENT:  Head: Normocephalic and atraumatic.  Eyes: Conjunctivae and EOM are normal. Pupils are equal, round, and reactive to light.  Neck: Normal range of motion.  Cardiovascular: Normal rate and regular  rhythm.  Pulmonary/Chest: Effort normal.  Abdominal: Soft.  Neurological: She is alert and oriented to person, place, and time. She has normal strength. A sensory deficit is present. No cranial nerve deficit.  Balance is still imparied and she uses the walls for balance. With any type of gaze, she begins to experience vertigo. There is some nystagmus, but she guards against it and resists end-gaze positions. Gait is ataxic. She is at high fall risk Skin: Skin is warm.    Psychiatric: Her speech is normal. Judgment and thought processing were much improved. Her attention and awarenss are better but remain impaired. She has difficulty staying focused during simple commands and tasks. She still has some short term memory issues..  Assessment & Plan:   ASSESSMENT:  1. Postconcussion syndrome with positional vertigo and persistent  vestibular symptoms, attention deficit, memory and sleep  dysfunction, anxiety, and depression. There continue to be large family psychosocial factors at play. There always appears to be some kind of crisis taking place in her life at any given time.  2. Chronic low back pain.   PLAN:  1. We will stay with Ritalin at the 7:00 am and 12 noon dosing for  now. 15mg  (A second rx was given for next month) Also will Add exelon for memory 1.5mg  bid  2. Continue klonopin 1mg  at beditme.  4. We will stay with Topamax for headaches f --increase to 150mg  qhs.  5. Discussed the importance of vestibular rehab to address her BPPV. Made a referral ONCE AGAIN for vestibular rehab. I believe [facility] provides this service.I also decreased neurontin to 300mg  bid. 6. Pain mgt (back) per Dr. Gerilyn Pilgrim 7. I'll see the patient back in 2 months. Extensive time was spent again on education and describing instructions which were printed out for her.  Again strict attendance is required to continue seeing me at this office.

## 2012-06-19 ENCOUNTER — Ambulatory Visit (INDEPENDENT_AMBULATORY_CARE_PROVIDER_SITE_OTHER): Payer: Medicare Other | Admitting: Ophthalmology

## 2012-06-22 DIAGNOSIS — M4120 Other idiopathic scoliosis, site unspecified: Secondary | ICD-10-CM | POA: Diagnosis not present

## 2012-06-22 DIAGNOSIS — M545 Low back pain, unspecified: Secondary | ICD-10-CM | POA: Diagnosis not present

## 2012-06-22 DIAGNOSIS — Z79899 Other long term (current) drug therapy: Secondary | ICD-10-CM | POA: Diagnosis not present

## 2012-06-22 DIAGNOSIS — R51 Headache: Secondary | ICD-10-CM | POA: Diagnosis not present

## 2012-06-30 ENCOUNTER — Telehealth: Payer: Self-pay

## 2012-06-30 DIAGNOSIS — H8110 Benign paroxysmal vertigo, unspecified ear: Secondary | ICD-10-CM

## 2012-06-30 DIAGNOSIS — R42 Dizziness and giddiness: Secondary | ICD-10-CM

## 2012-06-30 DIAGNOSIS — F0781 Postconcussional syndrome: Secondary | ICD-10-CM

## 2012-06-30 MED ORDER — METHYLPHENIDATE HCL 10 MG PO TABS: 15.0000 mg | ORAL_TABLET | Freq: Two times a day (BID) | ORAL | Status: DC

## 2012-06-30 NOTE — Telephone Encounter (Signed)
Needs refill on ritalin.  She also needs a letter stating her condition, such as balance issues, memory. Script printed to pick up Monday.  Patient aware.

## 2012-07-03 DIAGNOSIS — R94121 Abnormal vestibular function study: Secondary | ICD-10-CM | POA: Diagnosis not present

## 2012-07-07 DIAGNOSIS — R94121 Abnormal vestibular function study: Secondary | ICD-10-CM | POA: Diagnosis not present

## 2012-07-10 NOTE — Telephone Encounter (Signed)
Patient informed letter ready for pick up

## 2012-07-10 NOTE — Telephone Encounter (Signed)
Done

## 2012-07-10 NOTE — Telephone Encounter (Signed)
Patient called to follow up on letter request.  She needs this by the 15th.  Advised patient this was being worked on and that the provider was the only on in the office at this time.  She understands.

## 2012-07-12 DIAGNOSIS — R94121 Abnormal vestibular function study: Secondary | ICD-10-CM | POA: Diagnosis not present

## 2012-07-17 DIAGNOSIS — R94121 Abnormal vestibular function study: Secondary | ICD-10-CM | POA: Diagnosis not present

## 2012-07-18 ENCOUNTER — Telehealth: Payer: Self-pay

## 2012-07-18 NOTE — Telephone Encounter (Signed)
Mini a physical therapist at [facility] has been seeing patient for vestibular rehab.  She says she has an unsafe gait, causing her to fall into walls and run into things.  She is having trouble maneuvering a wheel chair as well.  Physical therapy recommends a power wheel chair for her at this point, to prevent any other injuries.  Patient can walk with assistance but looses balance and staggers often.  Please advise.

## 2012-07-21 DIAGNOSIS — R94121 Abnormal vestibular function study: Secondary | ICD-10-CM | POA: Diagnosis not present

## 2012-07-24 DIAGNOSIS — R94121 Abnormal vestibular function study: Secondary | ICD-10-CM | POA: Diagnosis not present

## 2012-07-27 DIAGNOSIS — Z79899 Other long term (current) drug therapy: Secondary | ICD-10-CM | POA: Diagnosis not present

## 2012-07-27 DIAGNOSIS — M25519 Pain in unspecified shoulder: Secondary | ICD-10-CM | POA: Diagnosis not present

## 2012-07-27 DIAGNOSIS — M545 Low back pain, unspecified: Secondary | ICD-10-CM | POA: Diagnosis not present

## 2012-07-27 DIAGNOSIS — R51 Headache: Secondary | ICD-10-CM | POA: Diagnosis not present

## 2012-07-28 ENCOUNTER — Encounter: Payer: Self-pay | Admitting: Physical Medicine & Rehabilitation

## 2012-07-28 ENCOUNTER — Encounter: Payer: Medicare Other | Attending: Physical Medicine & Rehabilitation | Admitting: Physical Medicine & Rehabilitation

## 2012-07-28 VITALS — BP 136/82 | HR 99 | Resp 14 | Ht 63.0 in | Wt 154.0 lb

## 2012-07-28 DIAGNOSIS — R109 Unspecified abdominal pain: Secondary | ICD-10-CM

## 2012-07-28 DIAGNOSIS — S060XAA Concussion with loss of consciousness status unknown, initial encounter: Secondary | ICD-10-CM

## 2012-07-28 DIAGNOSIS — R42 Dizziness and giddiness: Secondary | ICD-10-CM

## 2012-07-28 DIAGNOSIS — H8110 Benign paroxysmal vertigo, unspecified ear: Secondary | ICD-10-CM

## 2012-07-28 DIAGNOSIS — E119 Type 2 diabetes mellitus without complications: Secondary | ICD-10-CM | POA: Diagnosis not present

## 2012-07-28 DIAGNOSIS — F341 Dysthymic disorder: Secondary | ICD-10-CM

## 2012-07-28 DIAGNOSIS — F329 Major depressive disorder, single episode, unspecified: Secondary | ICD-10-CM

## 2012-07-28 DIAGNOSIS — F0781 Postconcussional syndrome: Secondary | ICD-10-CM | POA: Diagnosis not present

## 2012-07-28 DIAGNOSIS — F32A Depression, unspecified: Secondary | ICD-10-CM

## 2012-07-28 DIAGNOSIS — I651 Occlusion and stenosis of basilar artery: Secondary | ICD-10-CM

## 2012-07-28 DIAGNOSIS — R94121 Abnormal vestibular function study: Secondary | ICD-10-CM | POA: Diagnosis not present

## 2012-07-28 DIAGNOSIS — S060X9A Concussion with loss of consciousness of unspecified duration, initial encounter: Secondary | ICD-10-CM

## 2012-07-28 MED ORDER — METHYLPHENIDATE HCL 10 MG PO TABS: 15.0000 mg | ORAL_TABLET | Freq: Two times a day (BID) | ORAL | Status: DC

## 2012-07-28 NOTE — Patient Instructions (Signed)
I WILL CONTACT YOUR PHYSICAL THERAPIST ABOUT A POTENTIAL POWERED WHEELCHAIR.

## 2012-07-28 NOTE — Progress Notes (Signed)
Subjective:    Patient ID: Mallory Mendez, female    DOB: 1956/06/09, 57 y.o.   MRN: [id number]  HPI  Mallory Mendez is back regarding her post concussion syndrome, chronic balance problems. She has also struggles with back pain. Therapy has been working with her regarding her vestibular issues. They feel she remains at severe risk for falling. She has had falls and near falls already. She has to furniture walk or have a family member at her side to maintain balance. While a walker helps somewhat, her ataxia is so severe that it won't provide adequate support.  She tells me that she will follow up with interventional radiology over the next few months regarding her basilar artery stent  Her attention has been better. Her memory comes and goes. She feels that the exelon helps with memory.   She has had more restlessness at night and was hoping for klonopin at night. She already takes xanax up to 3 x per day.     Pain Inventory Average Pain 8 Pain Right Now 0 My pain is constant and sharp  In the last 24 hours, has pain interfered with the following? General activity 8 Relation with others 6 Enjoyment of life 5 What TIME of day is your pain at its worst? morning and afternoon Sleep (in general) Poor  Pain is worse with: walking Pain improves with: medication Relief from Meds: 6  Mobility walk without assistance how many minutes can you walk? 5 ability to climb steps?  no do you drive?  yes transfers alone Do you have any goals in this area?  no  Function disabled: date disabled 56 I need assistance with the following:  household duties and shopping  Neuro/Psych weakness numbness trouble walking dizziness confusion depression anxiety  Prior Studies Any changes since last visit?  no  Physicians involved in your care Any changes since last visit?  no   Family History  Problem Relation Age of Onset  . Cancer Mother     breast   History   Social History  .  Marital Status: Married    Spouse Name: N/A    Number of Children: N/A  . Years of Education: N/A   Social History Main Topics  . Smoking status: Current Every Day Smoker -- 1.0 packs/day  . Smokeless tobacco: Never Used  . Alcohol Use: No  . Drug Use: No  . Sexually Active: None   Other Topics Concern  . None   Social History Narrative  . None   Past Surgical History  Procedure Date  . Cholecystectomy 1984  . Spinal fusion 2006  . Eye surgery 2010    bilateral  . Abdominal hysterectomy   . Cerebral aneurysm repair   . Back surgery     SPINAL FUSION X6   . Incisional hernia repair 05/19/2011    Procedure: HERNIA REPAIR INCISIONAL;  Surgeon: Iona Coach, MD;  Location: [facility];  Service: General;  Laterality: N/A;  repair incisional hernia with mesh  . Hernia repair     09/21/2010  . Hernia repair 05/19/11    incisional hernia repair    Past Medical History  Diagnosis Date  . Diabetes mellitus   . Hyperlipidemia   . Blood transfusion   . Arthritis   . History of back surgery     multiple  . Hypertension   . Hernia     incisional  . Wears glasses   . MRSA (methicillin resistant Staphylococcus aureus)   .  Full dentures   . Chronic constipation   . Umbilical hernia   . Headache   . Dizziness   . Concussion     HAS HAD DIZZINESS/ MEMORY PROBLEMS/ SLOW SPEACH /BALANCE PROBLEMS SINCE CONCUSSION 1 1/2 YR AGO PR GOES TO THERAPY  . Fibromyalgia   . Scar   . GERD (gastroesophageal reflux disease)   . Chronic stomach ulcer   . Nocturia   . Sleep difficulties   . Depression   . Anxiety   . Brain injury     1 1/2 YR AGO  . Concussion   . Diabetes mellitus   . Paroxysmal vertigo    BP 136/82  Pulse 99  Resp 14  Ht 5\' 3"  (1.6 m)  Wt 154 lb (69.854 kg)  BMI 27.28 kg/m2  SpO2 100%    Review of Systems  Constitutional: Positive for appetite change.  Gastrointestinal: Positive for nausea.  Musculoskeletal: Positive for gait problem.  Neurological:  Positive for dizziness, weakness and numbness.  Psychiatric/Behavioral: Positive for suicidal ideas and dysphoric mood. The patient is nervous/anxious.   All other systems reviewed and are negative.       Objective:   Physical Exam Constitutional: She is oriented to person, place, and time. She appears well-developed and well-nourished.  HENT:  Head: Normocephalic and atraumatic.  Eyes: Conjunctivae and EOM are normal. Pupils are equal, round, and reactive to light.  Neck: Normal range of motion.  Cardiovascular: Normal rate and regular rhythm.  Pulmonary/Chest: Effort normal.  Abdominal: Soft.  Neurological: She is alert and oriented to person, place, and time. She has normal strength. A sensory deficit is present. No cranial nerve deficit.  Balance is still imparied and she uses the walls for balance. With any type of gaze, she begins to experience vertigo. There is some nystagmus, but she guards against it and resists end-gaze positions. Gait is ataxic. She is at high fall risk  Skin: Skin is warm.   Psychiatric: Her speech is normal. Judgment and thought processing were much improved. Her attention and awarenss are better but remain impaired. She has difficulty staying focused during simple commands and tasks. She still has some short term memory issues..   Assessment & Plan:   ASSESSMENT:  1. Postconcussion syndrome with positional vertigo and persistent  vestibular symptoms, attention deficit, memory and sleep  dysfunction, anxiety, and depression. There continue to be large family psychosocial factors at play. There always appears to be some kind of crisis taking place in her life at any given time.  2. Chronic low back pain.  3. Basilar artery stenosis.   PLAN:  1. We will stay with Ritalin at the 7:00 am and 12 noon dosing for  now. 15mg  (A second rx was given for next month) Continue exelon for memory 1.5mg  bid  2. Discussed mobility options with this patient. Given her  multiple medical issues which include her head trauma with post-concussion syndrome, basilar artery stenosis, and post-laminectomy syndrome with chronic low back pain, I believe a powered wheelchair is her best option. Her balance is NOT improving and she is at EXTREME risk for falling. Her back is too painful to propel a manual wheelchair around or home or in the community. She would have difficulty with a scooter because of her back pain as a scooter provides no lumbar support. I believe Infantof is motivated and competent to use a powered wheelchair in the home and in the community. It would allow her to safely perform ADL's and  would minimize her pain levels as well. Mallory Mendez wants to pursue a powered wheelchair at this time. 3. She sees INR later this spring for follow up of her basilar artery stent 4. We will stay with Topamax for headaches at 150mg  qhs.  5. Discussed the importance of vestibular rehab to address her vestibular sx. I have contacted [facility] regarding need for a powered wheelchair. 6. Pain mgt (back) per pain mgt physician 7. I'll see the patient back in 2 months.  Again strict attendance is required to continue seeing me at this office.

## 2012-08-04 DIAGNOSIS — R94121 Abnormal vestibular function study: Secondary | ICD-10-CM | POA: Diagnosis not present

## 2012-08-24 DIAGNOSIS — R51 Headache: Secondary | ICD-10-CM | POA: Diagnosis not present

## 2012-08-24 DIAGNOSIS — M545 Low back pain, unspecified: Secondary | ICD-10-CM | POA: Diagnosis not present

## 2012-08-24 DIAGNOSIS — Z79899 Other long term (current) drug therapy: Secondary | ICD-10-CM | POA: Diagnosis not present

## 2012-08-24 DIAGNOSIS — M25519 Pain in unspecified shoulder: Secondary | ICD-10-CM | POA: Diagnosis not present

## 2012-08-25 DIAGNOSIS — R94121 Abnormal vestibular function study: Secondary | ICD-10-CM | POA: Diagnosis not present

## 2012-09-01 DIAGNOSIS — R94121 Abnormal vestibular function study: Secondary | ICD-10-CM | POA: Diagnosis not present

## 2012-09-04 DIAGNOSIS — N318 Other neuromuscular dysfunction of bladder: Secondary | ICD-10-CM | POA: Diagnosis not present

## 2012-09-04 DIAGNOSIS — E785 Hyperlipidemia, unspecified: Secondary | ICD-10-CM | POA: Diagnosis not present

## 2012-09-04 DIAGNOSIS — Z1239 Encounter for other screening for malignant neoplasm of breast: Secondary | ICD-10-CM | POA: Diagnosis not present

## 2012-09-04 DIAGNOSIS — F411 Generalized anxiety disorder: Secondary | ICD-10-CM | POA: Diagnosis not present

## 2012-09-04 DIAGNOSIS — I10 Essential (primary) hypertension: Secondary | ICD-10-CM | POA: Diagnosis not present

## 2012-09-04 DIAGNOSIS — E1049 Type 1 diabetes mellitus with other diabetic neurological complication: Secondary | ICD-10-CM | POA: Diagnosis not present

## 2012-09-04 DIAGNOSIS — K219 Gastro-esophageal reflux disease without esophagitis: Secondary | ICD-10-CM | POA: Diagnosis not present

## 2012-09-04 DIAGNOSIS — E1142 Type 2 diabetes mellitus with diabetic polyneuropathy: Secondary | ICD-10-CM | POA: Diagnosis not present

## 2012-09-06 ENCOUNTER — Other Ambulatory Visit (HOSPITAL_COMMUNITY): Payer: Self-pay | Admitting: Family Medicine

## 2012-09-06 DIAGNOSIS — Z139 Encounter for screening, unspecified: Secondary | ICD-10-CM

## 2012-09-11 ENCOUNTER — Ambulatory Visit (HOSPITAL_COMMUNITY): Payer: Medicare Other

## 2012-09-18 ENCOUNTER — Ambulatory Visit (HOSPITAL_COMMUNITY): Payer: Medicare Other

## 2012-09-21 DIAGNOSIS — M545 Low back pain, unspecified: Secondary | ICD-10-CM | POA: Diagnosis not present

## 2012-09-21 DIAGNOSIS — M25519 Pain in unspecified shoulder: Secondary | ICD-10-CM | POA: Diagnosis not present

## 2012-09-21 DIAGNOSIS — Z79899 Other long term (current) drug therapy: Secondary | ICD-10-CM | POA: Diagnosis not present

## 2012-09-21 DIAGNOSIS — R51 Headache: Secondary | ICD-10-CM | POA: Diagnosis not present

## 2012-09-25 ENCOUNTER — Encounter: Payer: Medicare Other | Admitting: Physical Medicine & Rehabilitation

## 2012-09-26 ENCOUNTER — Encounter
Payer: Medicare Other | Attending: Physical Medicine and Rehabilitation | Admitting: Physical Medicine and Rehabilitation

## 2012-09-26 ENCOUNTER — Ambulatory Visit (HOSPITAL_COMMUNITY)
Admission: RE | Admit: 2012-09-26 | Discharge: 2012-09-26 | Disposition: A | Discharge status: Home / Self Care | Payer: Medicare Other | Source: Ambulatory Visit | Attending: Family Medicine | Admitting: Family Medicine

## 2012-09-26 ENCOUNTER — Encounter: Payer: Self-pay | Admitting: Physical Medicine and Rehabilitation

## 2012-09-26 VITALS — BP 154/76 | HR 87 | Resp 16 | Ht 63.0 in | Wt 154.0 lb

## 2012-09-26 DIAGNOSIS — K219 Gastro-esophageal reflux disease without esophagitis: Secondary | ICD-10-CM | POA: Diagnosis not present

## 2012-09-26 DIAGNOSIS — F0781 Postconcussional syndrome: Secondary | ICD-10-CM | POA: Diagnosis not present

## 2012-09-26 DIAGNOSIS — F411 Generalized anxiety disorder: Secondary | ICD-10-CM | POA: Diagnosis not present

## 2012-09-26 DIAGNOSIS — E119 Type 2 diabetes mellitus without complications: Secondary | ICD-10-CM | POA: Insufficient documentation

## 2012-09-26 DIAGNOSIS — G8929 Other chronic pain: Secondary | ICD-10-CM | POA: Insufficient documentation

## 2012-09-26 DIAGNOSIS — Z139 Encounter for screening, unspecified: Secondary | ICD-10-CM

## 2012-09-26 DIAGNOSIS — I651 Occlusion and stenosis of basilar artery: Secondary | ICD-10-CM | POA: Insufficient documentation

## 2012-09-26 DIAGNOSIS — R42 Dizziness and giddiness: Secondary | ICD-10-CM | POA: Diagnosis not present

## 2012-09-26 DIAGNOSIS — Z1231 Encounter for screening mammogram for malignant neoplasm of breast: Secondary | ICD-10-CM | POA: Insufficient documentation

## 2012-09-26 DIAGNOSIS — Z981 Arthrodesis status: Secondary | ICD-10-CM | POA: Diagnosis not present

## 2012-09-26 DIAGNOSIS — F329 Major depressive disorder, single episode, unspecified: Secondary | ICD-10-CM | POA: Diagnosis not present

## 2012-09-26 DIAGNOSIS — M545 Low back pain, unspecified: Secondary | ICD-10-CM | POA: Insufficient documentation

## 2012-09-26 DIAGNOSIS — E785 Hyperlipidemia, unspecified: Secondary | ICD-10-CM | POA: Diagnosis not present

## 2012-09-26 DIAGNOSIS — R41840 Attention and concentration deficit: Secondary | ICD-10-CM | POA: Diagnosis not present

## 2012-09-26 DIAGNOSIS — H8110 Benign paroxysmal vertigo, unspecified ear: Secondary | ICD-10-CM | POA: Diagnosis not present

## 2012-09-26 DIAGNOSIS — R413 Other amnesia: Secondary | ICD-10-CM | POA: Diagnosis not present

## 2012-09-26 DIAGNOSIS — G479 Sleep disorder, unspecified: Secondary | ICD-10-CM | POA: Diagnosis not present

## 2012-09-26 DIAGNOSIS — F3289 Other specified depressive episodes: Secondary | ICD-10-CM | POA: Insufficient documentation

## 2012-09-26 MED ORDER — TOPIRAMATE 100 MG PO TABS: 150.0000 mg | ORAL_TABLET | Freq: Every day | ORAL | Status: DC

## 2012-09-26 MED ORDER — METHYLPHENIDATE HCL 10 MG PO TABS: 15.0000 mg | ORAL_TABLET | Freq: Two times a day (BID) | ORAL | Status: DC

## 2012-09-26 MED ORDER — RIVASTIGMINE TARTRATE 1.5 MG PO CAPS: 1.5000 mg | ORAL_CAPSULE | Freq: Two times a day (BID) | ORAL | Status: DC

## 2012-09-26 MED ORDER — GABAPENTIN 600 MG PO TABS: 300.0000 mg | ORAL_TABLET | Freq: Two times a day (BID) | ORAL | Status: DC

## 2012-09-26 NOTE — Progress Notes (Signed)
Subjective:    Patient ID: Mallory Mendez, female    DOB: 1955-10-26, 57 y.o.   MRN: [id number]  HPI [name] is back regarding her post concussion syndrome, chronic balance problems. She has also struggles with back pain, Hx of PSF. Therapy has been working with her regarding her vestibular issues. They feel she remains at severe risk for falling. She has had falls and near falls already. She has to furniture walk or have a family member at her side to maintain balance. While a walker helps somewhat, her ataxia is so severe that it won't provide adequate support.  She tells me that she will follow up with interventional radiology over the next few months regarding her basilar artery stent  Her attention has been better. Her memory comes and goes. She feels that the exelon helps with memory.  She has had more restlessness at night and was hoping for klonopin at night. She already takes xanax up to 3 x per day. Her problem has been stable, she reports that she will receive her wheel chair next week. She also states, that she enjoys her PT, but she has not been there for 2 weeks, because her mother passed away.    Pain Inventory Average Pain 8 Pain Right Now 7 My pain is aching  In the last 24 hours, has pain interfered with the following? General activity 7 Relation with others 6 Enjoyment of life 6 What TIME of day is your pain at its worst? evening Sleep (in general) Poor  Pain is worse with: bending, standing and some activites Pain improves with: medication and TENS Relief from Meds: 6  Mobility walk with assistance use a walker how many minutes can you walk? 10 do you drive?  yes needs help with transfers Do you have any goals in this area?  yes  Function disabled: date disabled 34 I need assistance with the following:  dressing, meal prep, household duties and shopping  Neuro/Psych bladder control problems weakness numbness trouble  walking dizziness confusion anxiety  Prior Studies Any changes since last visit?  no  Physicians involved in your care Any changes since last visit?  no   Family History  Problem Relation Age of Onset  . Cancer Mother     breast   History   Social History  . Marital Status: Married    Spouse Name: N/A    Number of Children: N/A  . Years of Education: N/A   Social History Main Topics  . Smoking status: Current Every Day Smoker -- 1.00 packs/day  . Smokeless tobacco: Never Used  . Alcohol Use: No  . Drug Use: No  . Sexually Active: None   Other Topics Concern  . None   Social History Narrative  . None   Past Surgical History  Procedure Laterality Date  . Cholecystectomy  1984  . Spinal fusion  2006  . Eye surgery  2010    bilateral  . Abdominal hysterectomy    . Cerebral aneurysm repair    . Back surgery      SPINAL FUSION X6   . Incisional hernia repair  05/19/2011    Procedure: HERNIA REPAIR INCISIONAL;  Surgeon: Iona Coach, MD;  Location: [facility];  Service: General;  Laterality: N/A;  repair incisional hernia with mesh  . Hernia repair      09/21/2010  . Hernia repair  05/19/11    incisional hernia repair    Past Medical History  Diagnosis Date  .  Diabetes mellitus   . Hyperlipidemia   . Blood transfusion   . Arthritis   . History of back surgery     multiple  . Hypertension   . Hernia     incisional  . Wears glasses   . MRSA (methicillin resistant Staphylococcus aureus)   . Full dentures   . Chronic constipation   . Umbilical hernia   . Headache   . Dizziness   . Concussion     HAS HAD DIZZINESS/ MEMORY PROBLEMS/ SLOW SPEACH /BALANCE PROBLEMS SINCE CONCUSSION 1 1/2 YR AGO PR GOES TO THERAPY  . Fibromyalgia   . Scar   . GERD (gastroesophageal reflux disease)   . Chronic stomach ulcer   . Nocturia   . Sleep difficulties   . Depression   . Anxiety   . Brain injury     1 1/2 YR AGO  . Concussion   . Diabetes mellitus   .  Paroxysmal vertigo    BP 154/76  Pulse 87  Resp 16  Ht 5\' 3"  (1.6 m)  Wt 154 lb (69.854 kg)  BMI 27.29 kg/m2  SpO2 99%     Review of Systems  Constitutional: Positive for unexpected weight change.  Gastrointestinal: Positive for nausea.  Genitourinary: Positive for difficulty urinating.  Musculoskeletal: Positive for gait problem.  Neurological: Positive for dizziness, weakness and numbness.  Psychiatric/Behavioral: Positive for confusion. The patient is nervous/anxious.   All other systems reviewed and are negative.       Objective:   Physical Exam  Constitutional: She is oriented to person, place, and time. She appears well-developed and well-nourished.  HENT:  Head: Normocephalic and atraumatic.    Neck: Normal range of motion.  Pulmonary/Chest: Effort normal.   Neurological: She is alert and oriented to person, place, and time.   Balance is still imparied and she uses the walls for balance. With any type of gaze, she begins to experience vertigo.  She is at high fall risk  Skin: Skin is warm.   Psychiatric: Her speech is normal. Judgment and thought processing were much improved. Her attention and awarenss are better but remain impaired. She has difficulty staying focused during simple commands and tasks. She still has some short term memory problems.  Symmetric normal motor tone is noted throughout. Normal muscle bulk. Muscle testing reveals 5/5 muscle strength of the upper extremity, and 5/5 of the lower extremity on the right. LUE 4/5, LLE 3+/5. Full range of motion in upper and lower extremities. ROM of spine is restricted.   Patient arises from chair with difficulty. Wide based gait with severe balance problems, only able to walk with a person.        Assessment & Plan:  1. Postconcussion syndrome with positional vertigo and persistent  vestibular symptoms, attention deficit, memory and sleep  dysfunction, anxiety, and depression. There continue to be large  family psychosocial factors at play. There always appears to be some kind of crisis taking place in her life at any given time.  2. Chronic low back pain, Hx of PSF.  3. Basilar artery stenosis.  PLAN:  1. We will stay with Ritalin at the 7:00 am and 12 noon dosing for  now. 15mg  (A second rx was given for next month) Continue exelon for memory 1.5mg  bid  2. Discussed mobility options with this patient. Given her multiple medical issues which include her head trauma with post-concussion syndrome, basilar artery stenosis, and post-laminectomy syndrome with chronic low back pain,  a  powered wheelchair is her best option. Her balance is NOT improving and she is at EXTREME risk for falling. Her back is too painful to propel a manual wheelchair around or home or in the community. She would have difficulty with a scooter because of her back pain as a scooter provides no lumbar support.  Adriyanna is motivated and competent to use a powered wheelchair in the home and in the community. It would allow her to safely perform ADL's and would minimize her pain levels as well. Cloey wants to pursue a powered wheelchair at this time. She will receive one next week. 3. She sees INR later this spring for follow up of her basilar artery stent  4. We will stay with Topamax for headaches at 150mg  qhs.  5. Discussed the importance of vestibular rehab to address her vestibular sx. She should continue her PT, she states, that her next visit is at the end of this week. 6. Pain mgt (back) per pain mgt physician  7. Dr. Riley Kill is seeing the patient back in 2 months. Again strict attendance is required to continue seeing me at this office.

## 2012-09-26 NOTE — Patient Instructions (Signed)
Continue with your PT 

## 2012-10-03 DIAGNOSIS — R94121 Abnormal vestibular function study: Secondary | ICD-10-CM | POA: Diagnosis not present

## 2012-10-09 DIAGNOSIS — R94121 Abnormal vestibular function study: Secondary | ICD-10-CM | POA: Diagnosis not present

## 2012-10-12 DIAGNOSIS — R94121 Abnormal vestibular function study: Secondary | ICD-10-CM | POA: Diagnosis not present

## 2012-10-16 DIAGNOSIS — R94121 Abnormal vestibular function study: Secondary | ICD-10-CM | POA: Diagnosis not present

## 2012-10-19 DIAGNOSIS — R94121 Abnormal vestibular function study: Secondary | ICD-10-CM | POA: Diagnosis not present

## 2012-10-23 DIAGNOSIS — R51 Headache: Secondary | ICD-10-CM | POA: Diagnosis not present

## 2012-10-23 DIAGNOSIS — M545 Low back pain, unspecified: Secondary | ICD-10-CM | POA: Diagnosis not present

## 2012-10-23 DIAGNOSIS — Z79899 Other long term (current) drug therapy: Secondary | ICD-10-CM | POA: Diagnosis not present

## 2012-10-23 DIAGNOSIS — M25519 Pain in unspecified shoulder: Secondary | ICD-10-CM | POA: Diagnosis not present

## 2012-11-10 DIAGNOSIS — L708 Other acne: Secondary | ICD-10-CM | POA: Diagnosis not present

## 2012-11-10 DIAGNOSIS — M25519 Pain in unspecified shoulder: Secondary | ICD-10-CM | POA: Diagnosis not present

## 2012-11-22 DIAGNOSIS — R51 Headache: Secondary | ICD-10-CM | POA: Diagnosis not present

## 2012-11-22 DIAGNOSIS — M545 Low back pain, unspecified: Secondary | ICD-10-CM | POA: Diagnosis not present

## 2012-11-22 DIAGNOSIS — M25519 Pain in unspecified shoulder: Secondary | ICD-10-CM | POA: Diagnosis not present

## 2012-11-22 DIAGNOSIS — Z79899 Other long term (current) drug therapy: Secondary | ICD-10-CM | POA: Diagnosis not present

## 2012-11-28 ENCOUNTER — Encounter
Payer: Medicare Other | Attending: Physical Medicine and Rehabilitation | Admitting: Physical Medicine and Rehabilitation

## 2012-11-28 ENCOUNTER — Encounter: Payer: Self-pay | Admitting: Physical Medicine and Rehabilitation

## 2012-11-28 VITALS — BP 165/96 | HR 99 | Resp 14 | Ht 62.0 in | Wt 147.0 lb

## 2012-11-28 DIAGNOSIS — S060X9A Concussion with loss of consciousness of unspecified duration, initial encounter: Secondary | ICD-10-CM | POA: Diagnosis not present

## 2012-11-28 DIAGNOSIS — Z79899 Other long term (current) drug therapy: Secondary | ICD-10-CM | POA: Diagnosis not present

## 2012-11-28 DIAGNOSIS — G8929 Other chronic pain: Secondary | ICD-10-CM | POA: Diagnosis not present

## 2012-11-28 DIAGNOSIS — I651 Occlusion and stenosis of basilar artery: Secondary | ICD-10-CM | POA: Diagnosis not present

## 2012-11-28 DIAGNOSIS — F341 Dysthymic disorder: Secondary | ICD-10-CM | POA: Diagnosis not present

## 2012-11-28 DIAGNOSIS — M545 Low back pain, unspecified: Secondary | ICD-10-CM | POA: Diagnosis not present

## 2012-11-28 DIAGNOSIS — H8110 Benign paroxysmal vertigo, unspecified ear: Secondary | ICD-10-CM

## 2012-11-28 DIAGNOSIS — S060XAA Concussion with loss of consciousness status unknown, initial encounter: Secondary | ICD-10-CM | POA: Diagnosis not present

## 2012-11-28 DIAGNOSIS — R42 Dizziness and giddiness: Secondary | ICD-10-CM

## 2012-11-28 DIAGNOSIS — Z5181 Encounter for therapeutic drug level monitoring: Secondary | ICD-10-CM | POA: Diagnosis not present

## 2012-11-28 DIAGNOSIS — F0781 Postconcussional syndrome: Secondary | ICD-10-CM | POA: Insufficient documentation

## 2012-11-28 DIAGNOSIS — E119 Type 2 diabetes mellitus without complications: Secondary | ICD-10-CM | POA: Diagnosis not present

## 2012-11-28 MED ORDER — METHYLPHENIDATE HCL 10 MG PO TABS: 15.0000 mg | ORAL_TABLET | Freq: Two times a day (BID) | ORAL | Status: DC

## 2012-11-28 NOTE — Progress Notes (Signed)
Subjective:    Patient ID: Mallory Mendez, female    DOB: 30-Jan-1956, 57 y.o.   MRN: [id number]  HPI Mallory Mendez is back regarding her post concussion syndrome, chronic balance problems. She has also struggles with back pain, Hx of PSF. Therapy has been working with her regarding her vestibular issues. They feel she remains at severe risk for falling. She has had falls and near falls already. She has to furniture walk or have a family member at her side to maintain balance. While a walker helps somewhat, her ataxia is so severe that it won't provide adequate support.   Her memory comes and goes. She states, that she sometimes forgets to take her medication.   Her problem has been stable, she reports that she will receive her wheel chair soon. She also states, that she enjoys her PT, but she has not been there for 2-3 weeks, and the PT office needs a new order.  Pain Inventory Average Pain 9 Pain Right Now 8 My pain is constant and aching  In the last 24 hours, has pain interfered with the following? General activity 9 Relation with others 10 Enjoyment of life 10 What TIME of day is your pain at its worst? varies Sleep (in general) Poor  Pain is worse with: walking, bending and standing Pain improves with: rest Relief from Meds: n/a  Mobility walk with assistance use a walker how many minutes can you walk? 20 ability to climb steps?  no do you drive?  yes needs help with transfers Do you have any goals in this area?  no  Function disabled: date disabled 15  Neuro/Psych weakness tingling trouble walking dizziness confusion depression anxiety  Prior Studies Any changes since last visit?  no  Physicians involved in your care Any changes since last visit?  no   Family History  Problem Relation Age of Onset  . Cancer Mother     breast   History   Social History  . Marital Status: Married    Spouse Name: N/A    Number of Children: N/A  . Years of Education: N/A    Social History Main Topics  . Smoking status: Current Every Day Smoker -- 1.00 packs/day  . Smokeless tobacco: Never Used  . Alcohol Use: No  . Drug Use: No  . Sexually Active: None   Other Topics Concern  . None   Social History Narrative  . None   Past Surgical History  Procedure Laterality Date  . Cholecystectomy  1984  . Spinal fusion  2006  . Eye surgery  2010    bilateral  . Abdominal hysterectomy    . Cerebral aneurysm repair    . Back surgery      SPINAL FUSION X6   . Incisional hernia repair  05/19/2011    Procedure: HERNIA REPAIR INCISIONAL;  Surgeon: Iona Coach, MD;  Location: [facility];  Service: General;  Laterality: N/A;  repair incisional hernia with mesh  . Hernia repair      09/21/2010  . Hernia repair  05/19/11    incisional hernia repair    Past Medical History  Diagnosis Date  . Diabetes mellitus   . Hyperlipidemia   . Blood transfusion   . Arthritis   . History of back surgery     multiple  . Hypertension   . Hernia     incisional  . Wears glasses   . MRSA (methicillin resistant Staphylococcus aureus)   . Full dentures   .  Chronic constipation   . Umbilical hernia   . Headache   . Dizziness   . Concussion     HAS HAD DIZZINESS/ MEMORY PROBLEMS/ SLOW SPEACH /BALANCE PROBLEMS SINCE CONCUSSION 1 1/2 YR AGO PR GOES TO THERAPY  . Fibromyalgia   . Scar   . GERD (gastroesophageal reflux disease)   . Chronic stomach ulcer   . Nocturia   . Sleep difficulties   . Depression   . Anxiety   . Brain injury     1 1/2 YR AGO  . Concussion   . Diabetes mellitus   . Paroxysmal vertigo    BP 165/96  Pulse 99  Resp 14  Ht 5\' 2"  (1.575 m)  Wt 147 lb (66.679 kg)  BMI 26.88 kg/m2  SpO2 100%     Review of Systems  Constitutional: Positive for diaphoresis.  Musculoskeletal: Positive for gait problem.  Neurological: Positive for dizziness and weakness.  Psychiatric/Behavioral: Positive for confusion and dysphoric mood. The patient is  nervous/anxious.   All other systems reviewed and are negative.       Objective:   Physical Exam Constitutional: She is oriented to person, place, and time. She appears well-developed and well-nourished.  HENT:  Head: Normocephalic and atraumatic.  Neck: Normal range of motion.  Pulmonary/Chest: Effort normal.  Neurological: She is alert and oriented to person, place, and time.  Balance is still imparied and she uses the walls for balance. With any type of gaze, she begins to experience vertigo. She is at high fall risk  Skin: Skin is warm.   Psychiatric: Her speech is normal. Judgment and thought processing were much improved. Her attention and awarenss are better but remain impaired. She has difficulty staying focused during simple commands and tasks. She still has some short term memory problems.  Symmetric normal motor tone is noted throughout. Normal muscle bulk. Muscle testing reveals 5/5 muscle strength of the upper extremity, and 5/5 of the lower extremity on the right. LUE 4/5, LLE 3+/5. Full range of motion in upper and lower extremities. ROM of spine is restricted.  Patient arises from chair with difficulty. Wide based gait with severe balance problems, only able to walk with a person.         Assessment & Plan:  1. Postconcussion syndrome with positional vertigo and persistent  vestibular symptoms, attention deficit, memory and sleep  dysfunction, anxiety, and depression. There continue to be large family psychosocial factors at play. There always appears to be some kind of crisis taking place in her life at any given time.  2. Chronic low back pain, Hx of PSF.  3. Basilar artery stenosis.  PLAN:  1. We will stay with Ritalin at the 7:00 am and 12 noon dosing for  now. 15mg , she states, that she does not need a second prescription for the next month today. She reports that she did not take the Ritalin regularly the last 2 month, and she still has enough for this month. I  educated the patient, that she should take her medication as prescribed, and the her husband or daughter should help to remind her taking it. She reports that they are doing this now. Continue exelon for memory 1.5mg  bid  2. Discussed mobility options with this patient. Given her multiple medical issues which include her head trauma with post-concussion syndrome, basilar artery stenosis, and post-laminectomy syndrome with chronic low back pain, a powered wheelchair is her best option. Her balance is NOT improving and she is at  EXTREME risk for falling. Her back is too painful to propel a manual wheelchair around or home or in the community. She would have difficulty with a scooter because of her back pain as a scooter provides no lumbar support. Mallory Mendez is motivated and competent to use a powered wheelchair in the home and in the community. It would allow her to safely perform ADL's and would minimize her pain levels as well. Mallory Mendez wants to pursue a powered wheelchair at this time. She has not received one yet, but she states, that she will get one soon. 3.We will stay with Topamax for headaches at 150mg  qhs.  5. Discussed the importance of vestibular rehab to address her vestibular sx. She should continue her PT, I wrote a new order.  6. Follow up in 2 month or earlier if necessary.

## 2012-11-28 NOTE — Patient Instructions (Signed)
Continue with PT

## 2012-12-04 ENCOUNTER — Telehealth: Payer: Self-pay | Admitting: *Deleted

## 2012-12-04 NOTE — Telephone Encounter (Addendum)
People called from the wheelchair company about the paperwork.. Dr Riley Kill needs to fill out paperwork and send to them. "It is holding them up"

## 2012-12-20 DIAGNOSIS — M545 Low back pain, unspecified: Secondary | ICD-10-CM | POA: Diagnosis not present

## 2012-12-20 DIAGNOSIS — M25519 Pain in unspecified shoulder: Secondary | ICD-10-CM | POA: Diagnosis not present

## 2012-12-20 DIAGNOSIS — R51 Headache: Secondary | ICD-10-CM | POA: Diagnosis not present

## 2012-12-20 DIAGNOSIS — Z79899 Other long term (current) drug therapy: Secondary | ICD-10-CM | POA: Diagnosis not present

## 2012-12-21 DIAGNOSIS — M25519 Pain in unspecified shoulder: Secondary | ICD-10-CM | POA: Diagnosis not present

## 2012-12-26 ENCOUNTER — Telehealth: Payer: Self-pay

## 2012-12-26 DIAGNOSIS — H8110 Benign paroxysmal vertigo, unspecified ear: Secondary | ICD-10-CM

## 2012-12-26 DIAGNOSIS — F0781 Postconcussional syndrome: Secondary | ICD-10-CM

## 2012-12-26 NOTE — Telephone Encounter (Signed)
Patient husband called to see about patient being referred to [facility] rehab and to be evaluated for a wheelchair and letter of medcial necessity.  Spoke with husband and told he this had been done once before and he request we call him further on, but note in chart says not to talk to him.  Will reorder therapy but will contact patient not husbad.

## 2013-01-18 DIAGNOSIS — Z8739 Personal history of other diseases of the musculoskeletal system and connective tissue: Secondary | ICD-10-CM | POA: Diagnosis not present

## 2013-01-18 DIAGNOSIS — K219 Gastro-esophageal reflux disease without esophagitis: Secondary | ICD-10-CM | POA: Insufficient documentation

## 2013-01-18 DIAGNOSIS — Z87820 Personal history of traumatic brain injury: Secondary | ICD-10-CM | POA: Insufficient documentation

## 2013-01-18 DIAGNOSIS — Z8669 Personal history of other diseases of the nervous system and sense organs: Secondary | ICD-10-CM | POA: Diagnosis not present

## 2013-01-18 DIAGNOSIS — F17200 Nicotine dependence, unspecified, uncomplicated: Secondary | ICD-10-CM | POA: Diagnosis not present

## 2013-01-18 DIAGNOSIS — M129 Arthropathy, unspecified: Secondary | ICD-10-CM | POA: Diagnosis not present

## 2013-01-18 DIAGNOSIS — Z794 Long term (current) use of insulin: Secondary | ICD-10-CM | POA: Diagnosis not present

## 2013-01-18 DIAGNOSIS — Z8614 Personal history of Methicillin resistant Staphylococcus aureus infection: Secondary | ICD-10-CM | POA: Insufficient documentation

## 2013-01-18 DIAGNOSIS — S20219A Contusion of unspecified front wall of thorax, initial encounter: Secondary | ICD-10-CM | POA: Diagnosis not present

## 2013-01-18 DIAGNOSIS — I10 Essential (primary) hypertension: Secondary | ICD-10-CM | POA: Insufficient documentation

## 2013-01-18 DIAGNOSIS — F411 Generalized anxiety disorder: Secondary | ICD-10-CM | POA: Insufficient documentation

## 2013-01-18 DIAGNOSIS — F3289 Other specified depressive episodes: Secondary | ICD-10-CM | POA: Diagnosis not present

## 2013-01-18 DIAGNOSIS — Z91040 Latex allergy status: Secondary | ICD-10-CM | POA: Diagnosis not present

## 2013-01-18 DIAGNOSIS — F329 Major depressive disorder, single episode, unspecified: Secondary | ICD-10-CM | POA: Diagnosis not present

## 2013-01-18 DIAGNOSIS — E119 Type 2 diabetes mellitus without complications: Secondary | ICD-10-CM | POA: Insufficient documentation

## 2013-01-18 DIAGNOSIS — Z79899 Other long term (current) drug therapy: Secondary | ICD-10-CM | POA: Diagnosis not present

## 2013-01-18 DIAGNOSIS — S298XXA Other specified injuries of thorax, initial encounter: Secondary | ICD-10-CM | POA: Diagnosis not present

## 2013-01-18 DIAGNOSIS — R079 Chest pain, unspecified: Secondary | ICD-10-CM | POA: Diagnosis not present

## 2013-01-18 DIAGNOSIS — Z8719 Personal history of other diseases of the digestive system: Secondary | ICD-10-CM | POA: Diagnosis not present

## 2013-01-19 ENCOUNTER — Encounter (HOSPITAL_COMMUNITY): Payer: Self-pay

## 2013-01-19 ENCOUNTER — Emergency Department (HOSPITAL_COMMUNITY): Payer: Medicare Other

## 2013-01-19 ENCOUNTER — Emergency Department (HOSPITAL_COMMUNITY)
Admission: EM | Admit: 2013-01-19 | Discharge: 2013-01-19 | Disposition: A | Discharge status: Home / Self Care | Payer: Medicare Other | Attending: Emergency Medicine | Admitting: Emergency Medicine

## 2013-01-19 DIAGNOSIS — R079 Chest pain, unspecified: Secondary | ICD-10-CM | POA: Diagnosis not present

## 2013-01-19 DIAGNOSIS — S20211A Contusion of right front wall of thorax, initial encounter: Secondary | ICD-10-CM

## 2013-01-19 DIAGNOSIS — S298XXA Other specified injuries of thorax, initial encounter: Secondary | ICD-10-CM | POA: Diagnosis not present

## 2013-01-19 MED ORDER — OXYCODONE-ACETAMINOPHEN 5-325 MG PO TABS: 2.0000 | ORAL_TABLET | ORAL | Status: DC | PRN

## 2013-01-19 MED ORDER — OXYCODONE-ACETAMINOPHEN 5-325 MG PO TABS
2.0000 | ORAL_TABLET | Freq: Once | ORAL | Status: AC
  Administered 2013-01-19: 2 via ORAL
  Filled 2013-01-19: qty 2
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
## 2013-01-24 ENCOUNTER — Encounter: Payer: Self-pay | Admitting: Physical Medicine and Rehabilitation

## 2013-01-24 ENCOUNTER — Encounter
Payer: Medicare Other | Attending: Physical Medicine and Rehabilitation | Admitting: Physical Medicine and Rehabilitation

## 2013-01-24 ENCOUNTER — Telehealth: Payer: Self-pay | Admitting: *Deleted

## 2013-01-24 VITALS — BP 146/81 | HR 91 | Resp 14 | Ht 62.0 in | Wt 149.0 lb

## 2013-01-24 DIAGNOSIS — F988 Other specified behavioral and emotional disorders with onset usually occurring in childhood and adolescence: Secondary | ICD-10-CM | POA: Diagnosis not present

## 2013-01-24 DIAGNOSIS — F411 Generalized anxiety disorder: Secondary | ICD-10-CM | POA: Insufficient documentation

## 2013-01-24 DIAGNOSIS — H8110 Benign paroxysmal vertigo, unspecified ear: Secondary | ICD-10-CM | POA: Diagnosis not present

## 2013-01-24 DIAGNOSIS — F0781 Postconcussional syndrome: Secondary | ICD-10-CM | POA: Insufficient documentation

## 2013-01-24 DIAGNOSIS — F329 Major depressive disorder, single episode, unspecified: Secondary | ICD-10-CM | POA: Diagnosis not present

## 2013-01-24 DIAGNOSIS — R42 Dizziness and giddiness: Secondary | ICD-10-CM

## 2013-01-24 DIAGNOSIS — I651 Occlusion and stenosis of basilar artery: Secondary | ICD-10-CM | POA: Insufficient documentation

## 2013-01-24 DIAGNOSIS — G479 Sleep disorder, unspecified: Secondary | ICD-10-CM | POA: Insufficient documentation

## 2013-01-24 DIAGNOSIS — F3289 Other specified depressive episodes: Secondary | ICD-10-CM | POA: Diagnosis not present

## 2013-01-24 DIAGNOSIS — R413 Other amnesia: Secondary | ICD-10-CM | POA: Diagnosis not present

## 2013-01-24 DIAGNOSIS — G8929 Other chronic pain: Secondary | ICD-10-CM | POA: Diagnosis not present

## 2013-01-24 MED ORDER — METHYLPHENIDATE HCL 10 MG PO TABS: 15.0000 mg | ORAL_TABLET | Freq: Two times a day (BID) | ORAL | Status: DC

## 2013-01-24 NOTE — Telephone Encounter (Signed)
Will call as soon I have the time.

## 2013-01-24 NOTE — Telephone Encounter (Signed)
Lawson Fiscal needs to speak with Clydie Braun because medicare has denied the equipment requested due to some information on form.  She needs to determine if it was filled out incorrectly. (the form is scanned in chart under media tab)  Please call her at [phone]
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
## 2013-01-25 DIAGNOSIS — Z79899 Other long term (current) drug therapy: Secondary | ICD-10-CM | POA: Diagnosis not present

## 2013-01-25 DIAGNOSIS — M25519 Pain in unspecified shoulder: Secondary | ICD-10-CM | POA: Diagnosis not present

## 2013-01-25 DIAGNOSIS — F17200 Nicotine dependence, unspecified, uncomplicated: Secondary | ICD-10-CM | POA: Diagnosis not present

## 2013-01-25 DIAGNOSIS — G894 Chronic pain syndrome: Secondary | ICD-10-CM | POA: Diagnosis not present

## 2013-02-12 ENCOUNTER — Telehealth (HOSPITAL_COMMUNITY): Payer: Self-pay | Admitting: Interventional Radiology

## 2013-02-12 ENCOUNTER — Other Ambulatory Visit (HOSPITAL_COMMUNITY): Payer: Self-pay | Admitting: Interventional Radiology

## 2013-02-12 DIAGNOSIS — I729 Aneurysm of unspecified site: Secondary | ICD-10-CM

## 2013-02-12 NOTE — Telephone Encounter (Signed)
Called pt left VM for her to call me back to schedule f/u angio JMichaux

## 2013-02-16 ENCOUNTER — Telehealth (HOSPITAL_COMMUNITY): Payer: Self-pay | Admitting: Interventional Radiology

## 2013-02-16 NOTE — Telephone Encounter (Signed)
Called pt left VM for pt to call and schedule her angio JMichaux

## 2013-02-20 DIAGNOSIS — M549 Dorsalgia, unspecified: Secondary | ICD-10-CM | POA: Diagnosis not present

## 2013-02-20 DIAGNOSIS — Z79899 Other long term (current) drug therapy: Secondary | ICD-10-CM | POA: Diagnosis not present

## 2013-02-20 DIAGNOSIS — R269 Unspecified abnormalities of gait and mobility: Secondary | ICD-10-CM | POA: Diagnosis not present

## 2013-02-20 DIAGNOSIS — G894 Chronic pain syndrome: Secondary | ICD-10-CM | POA: Diagnosis not present

## 2013-02-21 ENCOUNTER — Encounter (HOSPITAL_COMMUNITY): Payer: Self-pay | Admitting: Pharmacy Technician

## 2013-02-22 ENCOUNTER — Other Ambulatory Visit: Payer: Self-pay | Admitting: Radiology

## 2013-03-02 ENCOUNTER — Ambulatory Visit (HOSPITAL_COMMUNITY)
Admission: RE | Admit: 2013-03-02 | Discharge: 2013-03-02 | Disposition: A | Discharge status: Home / Self Care | Payer: Medicare Other | Source: Ambulatory Visit | Attending: Interventional Radiology | Admitting: Interventional Radiology

## 2013-03-02 ENCOUNTER — Other Ambulatory Visit (HOSPITAL_COMMUNITY): Payer: Self-pay | Admitting: Radiology

## 2013-03-02 ENCOUNTER — Encounter: Payer: Self-pay | Admitting: Cardiology

## 2013-03-02 ENCOUNTER — Ambulatory Visit (HOSPITAL_COMMUNITY)
Admission: RE | Admit: 2013-03-02 | Discharge: 2013-03-02 | Disposition: A | Discharge status: Home / Self Care | Payer: Medicare Other | Source: Ambulatory Visit | Attending: Radiology | Admitting: Radiology

## 2013-03-02 ENCOUNTER — Encounter (HOSPITAL_COMMUNITY): Payer: Self-pay

## 2013-03-02 DIAGNOSIS — R270 Ataxia, unspecified: Secondary | ICD-10-CM

## 2013-03-02 DIAGNOSIS — Z48812 Encounter for surgical aftercare following surgery on the circulatory system: Secondary | ICD-10-CM | POA: Insufficient documentation

## 2013-03-02 DIAGNOSIS — E119 Type 2 diabetes mellitus without complications: Secondary | ICD-10-CM | POA: Insufficient documentation

## 2013-03-02 DIAGNOSIS — I671 Cerebral aneurysm, nonruptured: Secondary | ICD-10-CM | POA: Insufficient documentation

## 2013-03-02 DIAGNOSIS — I10 Essential (primary) hypertension: Secondary | ICD-10-CM | POA: Insufficient documentation

## 2013-03-02 DIAGNOSIS — R269 Unspecified abnormalities of gait and mobility: Secondary | ICD-10-CM | POA: Insufficient documentation

## 2013-03-02 DIAGNOSIS — M7989 Other specified soft tissue disorders: Secondary | ICD-10-CM | POA: Insufficient documentation

## 2013-03-02 DIAGNOSIS — R0602 Shortness of breath: Secondary | ICD-10-CM | POA: Insufficient documentation

## 2013-03-02 DIAGNOSIS — Z794 Long term (current) use of insulin: Secondary | ICD-10-CM | POA: Diagnosis not present

## 2013-03-02 DIAGNOSIS — R279 Unspecified lack of coordination: Secondary | ICD-10-CM

## 2013-03-02 DIAGNOSIS — R4789 Other speech disturbances: Secondary | ICD-10-CM | POA: Diagnosis not present

## 2013-03-02 DIAGNOSIS — H5340 Unspecified visual field defects: Secondary | ICD-10-CM

## 2013-03-02 DIAGNOSIS — I729 Aneurysm of unspecified site: Secondary | ICD-10-CM

## 2013-03-02 DIAGNOSIS — R079 Chest pain, unspecified: Secondary | ICD-10-CM | POA: Diagnosis not present

## 2013-03-02 DIAGNOSIS — F17200 Nicotine dependence, unspecified, uncomplicated: Secondary | ICD-10-CM | POA: Insufficient documentation

## 2013-03-02 LAB — CBC WITH DIFFERENTIAL/PLATELET
Basophils Relative: 0 % (ref 0–1)
Eosinophils Absolute: 0.1 10*3/uL (ref 0.0–0.7)
Eosinophils Relative: 2 % (ref 0–5)
MCH: 29.6 pg (ref 26.0–34.0)
MCHC: 35.1 g/dL (ref 30.0–36.0)
Neutrophils Relative %: 65 % (ref 43–77)
Platelets: 151 10*3/uL (ref 150–400)

## 2013-03-02 LAB — BASIC METABOLIC PANEL
BUN: 16 mg/dL (ref 6–23)
Calcium: 8.8 mg/dL (ref 8.4–10.5)
Creatinine, Ser: 0.58 mg/dL (ref 0.50–1.10)
GFR calc non Af Amer: >90 mL/min (ref >90)
Glucose, Bld: 114 mg/dL — ABNORMAL HIGH (ref 70–99)

## 2013-03-02 LAB — PROTIME-INR
INR: 1 (ref 0.00–1.49)
Prothrombin Time: 13 seconds (ref 11.6–15.2)

## 2013-03-02 MED ORDER — MIDAZOLAM HCL 2 MG/2ML IJ SOLN
INTRAMUSCULAR | Status: AC
  Filled 2013-03-02: qty 2

## 2013-03-02 MED ORDER — SODIUM CHLORIDE 0.9 % IV SOLN
Freq: Once | INTRAVENOUS | Status: AC
  Administered 2013-03-02: 08:00:00 via INTRAVENOUS

## 2013-03-02 MED ORDER — FENTANYL CITRATE 0.05 MG/ML IJ SOLN
INTRAMUSCULAR | Status: AC
  Filled 2013-03-02: qty 2

## 2013-03-02 NOTE — H&P (Signed)
Chief Complaint: "I am here today for follow-up on my aneurysm."  HPI: Mallory Mendez is an 57 y.o. 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 Patient had a cerebral  arteriogram 01/2012 findings revealed angiographically obliterated basilar apex aneurysm without evidence of coil compaction or recanalization. Wide patency of the stent across the neck of the aneurysm. Minimal atherosclerotic disease in the right internal carotid artery bulb. Patient states she has had ongoing weakness in LUE, LLE, she c/o headaches more frequent and more intense within this last year. She describes these headaches as "pressure" located in right occipital region. Her headaches occur in the mornings and are associated with N/V. She also c/o photophobia and swishing sound in her right ear. She states when she palpates behind her right ear she experiences pain. Family c/o confusion and memory lapses. The patient denies any syncopal episodes.   The patient also states today that she has been experiencing chest pain x 2 weeks. Chest pain is located left sided inferior to breast and radiating around laterally. She does c/o associated shortness of breath with these episodes, pain worse with movement. She denies any diaphoresis, or nausea. She denies any current chest pain. She is also c/o 1 1/2 week bilateral lower leg swelling. She does have a PCP appointment setup for September 9th to be evaluated. She states she has had a cardiac stress test a "long time ago."    Past Medical History:  Past Medical History  Diagnosis Date  . Diabetes mellitus   . Hyperlipidemia   . Blood transfusion   . Arthritis   . History of back surgery     multiple  . Hypertension   . Hernia     incisional  . Wears glasses   . MRSA (methicillin resistant Staphylococcus aureus)   . Full dentures   . Chronic constipation   . Umbilical hernia   . Headache(784.0)   . Dizziness   . Concussion      HAS HAD DIZZINESS/ MEMORY PROBLEMS/ SLOW SPEACH /BALANCE PROBLEMS SINCE CONCUSSION 1 1/2 YR AGO PR GOES TO THERAPY  . Fibromyalgia   . Scar   . GERD (gastroesophageal reflux disease)   . Chronic stomach ulcer   . Nocturia   . Sleep difficulties   . Depression   . Anxiety   . Brain injury     1 1/2 YR AGO  . Concussion   . Diabetes mellitus   . Paroxysmal vertigo     Past Surgical History:  Past Surgical History  Procedure Laterality Date  . Cholecystectomy  1984  . Spinal fusion  2006  . Eye surgery  2010    bilateral  . Abdominal hysterectomy    . Cerebral aneurysm repair    . Back surgery      SPINAL FUSION X6   . Incisional hernia repair  05/19/2011    Procedure: HERNIA REPAIR INCISIONAL;  Surgeon: Iona Coach, MD;  Location: [facility];  Service: General;  Laterality: N/A;  repair incisional hernia with mesh  . Hernia repair      09/21/2010  . Hernia repair  05/19/11    incisional hernia repair     Family History:  Family History  Problem Relation Age of Onset  . Cancer Mother     breast    Social History:  reports that she has been smoking.  She has never used smokeless tobacco. She reports that she does not drink alcohol  or use illicit drugs. 1ppd tobacco user.  Allergies:  Allergies  Allergen Reactions  . Cymbalta [Duloxetine Hcl] Anaphylaxis  . Latex Anaphylaxis      Medication List    ASK your doctor about these medications       ALPRAZolam 1 MG tablet  Commonly known as:  XANAX  Take 1 mg by mouth 3 (three) times daily as needed. For anxiety     AMBIEN CR 12.5 MG CR tablet  Generic drug:  zolpidem  Take 12.5 mg by mouth at bedtime as needed for sleep.     cycloSPORINE 0.05 % ophthalmic emulsion  Commonly known as:  RESTASIS  Place 1 drop into both eyes 2 (two) times daily.     desvenlafaxine 100 MG 24 hr tablet  Commonly known as:  PRISTIQ  Take 100 mg by mouth every morning.     docusate sodium 100 MG capsule  Commonly known  as:  COLACE  Take 100 mg by mouth 2 (two) times daily.     esomeprazole 40 MG capsule  Commonly known as:  NEXIUM  Take 40 mg by mouth at bedtime.     gabapentin 600 MG tablet  Commonly known as:  NEURONTIN  Take 600 mg by mouth 2 (two) times daily.     imipramine 50 MG tablet  Commonly known as:  TOFRANIL  Take 50 mg by mouth at bedtime.     insulin glargine 100 UNIT/ML injection  Commonly known as:  LANTUS  Inject 28 Units into the skin at bedtime.     insulin lispro 100 UNIT/ML injection  Commonly known as:  HUMALOG  Inject 1-4 Units into the skin 3 (three) times daily before meals. Sliding scale     lisinopril 20 MG tablet  Commonly known as:  PRINIVIL,ZESTRIL  Take 20 mg by mouth at bedtime.     lubiprostone 24 MCG capsule  Commonly known as:  AMITIZA  Take 24 mcg by mouth 2 (two) times daily with a meal.     meclizine 25 MG tablet  Commonly known as:  ANTIVERT  Take 25 mg by mouth 3 (three) times daily as needed for dizziness.     methylphenidate 10 MG tablet  Commonly known as:  RITALIN  Take 1.5 tablets (15 mg total) by mouth 2 (two) times daily.     oxyCODONE-acetaminophen 10-325 MG per tablet  Commonly known as:  PERCOCET  Take 2 tablets by mouth every 4 (four) hours as needed for pain.     promethazine 25 MG tablet  Commonly known as:  PHENERGAN  Take 25 mg by mouth every 6 (six) hours as needed for nausea.     rivastigmine 1.5 MG capsule  Commonly known as:  EXELON  Take 1 capsule (1.5 mg total) by mouth 2 (two) times daily.     topiramate 100 MG tablet  Commonly known as:  TOPAMAX  Take 1.5 tablets (150 mg total) by mouth daily.     torsemide 20 MG tablet  Commonly known as:  DEMADEX  Take 20 mg by mouth every morning.        Please HPI for pertinent positives, otherwise complete 10 system ROS negative.  Physical Exam: BP 130/78  Pulse 84  Temp(Src) 97.8 F (36.6 C) (Oral)  Resp 20  Ht 5\' 3"  (1.6 m)  Wt 148 lb (67.132 kg)  BMI 26.22  kg/m2  SpO2 99% Body mass index is 26.22 kg/(m^2).   General Appearance:  Alert, cooperative, no distress,  appears stated age  Head:  Normocephalic, without obvious abnormality, atraumatic  Lungs:   Clear to auscultation bilaterally, no w/r/r, respirations unlabored without use of accessory muscles.  Chest Wall:  No tenderness or deformity  Heart:  Regular rate and rhythm, S1, S2 normal, no murmur, rub or gallop.  Abdomen:   Soft, non-tender, non distended.  Extremities: Extremities 1+ pitting edema RLE, trace edema LLE, atraumatic, no cyanosis.  Pulses: 1+ and symmetric  Neurologic: Normal affect, difficult coordination of movements.   Results for orders placed during the hospital encounter of 03/02/13 (from the past 48 hour(s))  APTT     Status: None   Collection Time    03/02/13  7:26 AM      Result Value Range   aPTT 30  24 - 37 seconds  BASIC METABOLIC PANEL     Status: Abnormal   Collection Time    03/02/13  7:26 AM      Result Value Range   Sodium 139  135 - 145 mEq/L   Potassium 3.6  3.5 - 5.1 mEq/L   Chloride 105  96 - 112 mEq/L   CO2 23  19 - 32 mEq/L   Glucose, Bld 114 (*) 70 - 99 mg/dL   BUN 16  6 - 23 mg/dL   Creatinine, Ser 1.61  0.50 - 1.10 mg/dL   Calcium 8.8  8.4 - 09.6 mg/dL   GFR calc non Af Amer >90  >90 mL/min   GFR calc Af Amer >90  >90 mL/min   Comment: (NOTE)     The eGFR has been calculated using the CKD EPI equation.     This calculation has not been validated in all clinical situations.     eGFR's persistently <90 mL/min signify possible Chronic Kidney     Disease.  CBC WITH DIFFERENTIAL     Status: None   Collection Time    03/02/13  7:26 AM      Result Value Range   WBC 8.1  4.0 - 10.5 K/uL   RBC 4.36  3.87 - 5.11 MIL/uL   Hemoglobin 12.9  12.0 - 15.0 g/dL   HCT 04.5  40.9 - 81.1 %   MCV 84.4  78.0 - 100.0 fL   MCH 29.6  26.0 - 34.0 pg   MCHC 35.1  30.0 - 36.0 g/dL   RDW 91.4  78.2 - 95.6 %   Platelets 151  150 - 400 K/uL   Neutrophils  Relative % 65  43 - 77 %   Neutro Abs 5.3  1.7 - 7.7 K/uL   Lymphocytes Relative 26  12 - 46 %   Lymphs Abs 2.1  0.7 - 4.0 K/uL   Monocytes Relative 7  3 - 12 %   Monocytes Absolute 0.6  0.1 - 1.0 K/uL   Eosinophils Relative 2  0 - 5 %   Eosinophils Absolute 0.1  0.0 - 0.7 K/uL   Basophils Relative 0  0 - 1 %   Basophils Absolute 0.0  0.0 - 0.1 K/uL  PROTIME-INR     Status: None   Collection Time    03/02/13  7:26 AM      Result Value Range   Prothrombin Time 13.0  11.6 - 15.2 seconds   INR 1.00  0.00 - 1.49  GLUCOSE, CAPILLARY     Status: Abnormal   Collection Time    03/02/13  8:00 AM      Result Value Range   Glucose-Capillary 117 (*)  70 - 99 mg/dL   No results found.  Assessment/Plan Patient scheduled for 1 year follow-up cerebral arteriogram. Last arteriogram 01/2012. 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 Patient with new neurological symptoms, ataxia, left sided visual changes. Tobacco abuse. Chest pain x 2 weeks with increased B/L LE swelling, no recent evaluation. I will obtain 12 lead ecg and have cardiology evaluate prior to proceeding. Dr. Corliss Skains has spoke with Dr. Jacinto Halim.  Labs reviewed, patient has been NPO. Risks and Benefits discussed with the patient and her family. All of the patient's questions were answered, patient is agreeable to proceed. Consent signed and in chart. MRI of brain to be performed. Proceed arteriogram after MRI per cardiology recommendations.    Pattricia Boss D PA-C 03/02/2013, 8:26 AM

## 2013-03-02 NOTE — Consult Note (Signed)
CARDIOLOGY CONSULT NOTE  Patient ID: Mallory Mendez MRN: [id number] DOB/AGE: 1955/11/20 57 y.o.  Admit date: (Not on file) Referring Physician  Kerby Nora, MD Primary Physician:  Johny Blamer, MD Reason for Consultation  chest pain  HPI: Patient is a 57 year old Caucasian female with history of hypertension, hyperlipidemia, history of known cerebral aneurysm who has been scheduled for cerebral angiography today, had been complaining about chest discomfort that started about 10 days to 2 weeks ago. About a month ago she had an accidental injury to her right chest wall, chest [facility] at that time at [facility] had been unremarkable. She now complains of left-sided chest pain. Chest pain is located in the left breast area. This started about 2 weeks ago, it goes on for about a few seconds each time. 3 episodes per day. Taking deep breath versus the chest discomfort. There is no associated palpitations, although she has had difficulty in breathing due to taking deep breath causing her to have more chest discomfort. Patient is chronic shortness breath and dyspnea on exertion. She smokes close to a pack of cigarettes a day. She denies any PND or orthopnea. She has significant symptoms this is a sleep apnea with daytime somnolence, witnessed apneic episodes and also loud snoring. She has occasional cramping in her legs right calf worse on the left, mostly during the night, sometimes with activity. She denies any hemoptysis, painful swelling of the lower extremity, bluish discoloration of her toes.  Past Medical History  Diagnosis Date  . Diabetes mellitus   . Hyperlipidemia   . Blood transfusion   . Arthritis   . History of back surgery     multiple  . Hypertension   . Hernia     incisional  . Wears glasses   . MRSA (methicillin resistant Staphylococcus aureus)   . Full dentures   . Chronic constipation   . Umbilical hernia   . Headache(784.0)   . Dizziness   . Concussion      HAS HAD DIZZINESS/ MEMORY PROBLEMS/ SLOW SPEACH /BALANCE PROBLEMS SINCE CONCUSSION 1 1/2 YR AGO PR GOES TO THERAPY  . Fibromyalgia   . Scar   . GERD (gastroesophageal reflux disease)   . Chronic stomach ulcer   . Nocturia   . Sleep difficulties   . Depression   . Anxiety   . Brain injury     1 1/2 YR AGO  . Concussion   . Diabetes mellitus   . Paroxysmal vertigo      Past Surgical History  Procedure Laterality Date  . Cholecystectomy  1984  . Spinal fusion  2006  . Eye surgery  2010    bilateral  . Abdominal hysterectomy    . Cerebral aneurysm repair    . Back surgery      SPINAL FUSION X6   . Incisional hernia repair  05/19/2011    Procedure: HERNIA REPAIR INCISIONAL;  Surgeon: Iona Coach, MD;  Location: [facility];  Service: General;  Laterality: N/A;  repair incisional hernia with mesh  . Hernia repair      09/21/2010  . Hernia repair  05/19/11    incisional hernia repair      Family History  Problem Relation Age of Onset  . Cancer Mother     breast     Social History: History   Social History  . Marital Status: Married    Spouse Name: N/A    Number of Children: N/A  . Years of Education: N/A  Occupational History  . Not on file.   Social History Main Topics  . Smoking status: Current Every Day Smoker -- 1.00 packs/day  . Smokeless tobacco: Never Used  . Alcohol Use: No  . Drug Use: No  . Sexual Activity: Not on file   Other Topics Concern  . Not on file   Social History Narrative  . No narrative on file      (Not in a hospital admission)  Scheduled Meds: Continuous Infusions: PRN Meds:.    ROS: General: no fevers/chills/night sweats. Complains of generalized fatigue. Eyes: no blurry vision, diplopia, or amaurosis ENT: no sore throat or hearing loss Resp: no cough, wheezing, or hemoptysis CV: no edema or palpitations GI: no abdominal pain, nausea, vomiting, diarrhea, or constipation GU: no dysuria, frequency, or  hematuria Skin: no rash Neuro: no headache, numbness, tingling, or weakness of extremities  Physical Exam: There were no vitals taken for this visit.   General appearance: alert, cooperative, appears older than stated age, no distress and mildly obese Lungs: clear to auscultation bilaterally Chest wall: left sided chest wall tenderness Abdomen: soft, non-tender; bowel sounds normal; no masses,  no organomegaly Extremities: extremities normal, atraumatic, no cyanosis or edema Pulses: 2+ and symmetric Bilateral carotid bruit present Neurologic: Grossly normal  Labs:   Lab Results  Component Value Date   WBC 8.1 03/02/2013   HGB 12.9 03/02/2013   HCT 36.8 03/02/2013   MCV 84.4 03/02/2013   PLT 151 03/02/2013    Recent Labs Lab 03/02/13 0726  NA 139  K 3.6  CL 105  CO2 23  BUN 16  CREATININE 0.58  CALCIUM 8.8  GLUCOSE 114*   EKG: normal EKG, normal sinus rhythm, unchanged from previous tracings.   ASSESSMENT AND PLAN:   1. Chest pain clearly musculoskeletal, easily reproducible costochondral junction tenderness. 2. Shortness breath and dyspnea and exertion due to probable underlying COPD, deconditioning. 3. Diabetes mellitus type 2 4. Hypertension 5. Tobacco use disorder 6. History of cerebral aneurysm  Recommendation: Patient is multiple Norvasc this factors, she does indeed need cardiac resuscitation including an echocardiogram and a stress test which can be performed in the outpatient setting. He does complain of cramping in her legs, however she is bounding pulses, evaluation for pseudo-claudication may be indicated. I will see her back in the office and I given her my contact information. At this point I do not see any contraindication for her to have cerebral angiography from cardiac standpoint. I thank Dr.Deveshwar for having given me the opportunity to see this pleasant patient. I extensively discussed with the patient in front of her family regarding smoking  cessation, effects of smoking on cardiovascular outcomes.  [name], MD 03/02/2013, 9:31 AM [facility] Pager: [phone] Office: [phone] If no answer Cell [phone]

## 2013-03-06 ENCOUNTER — Other Ambulatory Visit: Payer: Self-pay | Admitting: Radiology

## 2013-03-09 ENCOUNTER — Ambulatory Visit (HOSPITAL_COMMUNITY)
Admission: RE | Admit: 2013-03-09 | Discharge: 2013-03-09 | Disposition: A | Discharge status: Home / Self Care | Payer: Medicare Other | Source: Ambulatory Visit | Attending: Interventional Radiology | Admitting: Interventional Radiology

## 2013-03-09 ENCOUNTER — Other Ambulatory Visit (HOSPITAL_COMMUNITY): Payer: Self-pay | Admitting: Interventional Radiology

## 2013-03-09 ENCOUNTER — Encounter (HOSPITAL_COMMUNITY): Payer: Self-pay

## 2013-03-09 DIAGNOSIS — I671 Cerebral aneurysm, nonruptured: Secondary | ICD-10-CM | POA: Insufficient documentation

## 2013-03-09 DIAGNOSIS — F329 Major depressive disorder, single episode, unspecified: Secondary | ICD-10-CM | POA: Insufficient documentation

## 2013-03-09 DIAGNOSIS — I729 Aneurysm of unspecified site: Secondary | ICD-10-CM

## 2013-03-09 DIAGNOSIS — F3289 Other specified depressive episodes: Secondary | ICD-10-CM | POA: Diagnosis not present

## 2013-03-09 DIAGNOSIS — Z79899 Other long term (current) drug therapy: Secondary | ICD-10-CM | POA: Insufficient documentation

## 2013-03-09 DIAGNOSIS — Z7982 Long term (current) use of aspirin: Secondary | ICD-10-CM | POA: Diagnosis not present

## 2013-03-09 DIAGNOSIS — Z981 Arthrodesis status: Secondary | ICD-10-CM | POA: Diagnosis not present

## 2013-03-09 DIAGNOSIS — G479 Sleep disorder, unspecified: Secondary | ICD-10-CM | POA: Diagnosis not present

## 2013-03-09 DIAGNOSIS — F17200 Nicotine dependence, unspecified, uncomplicated: Secondary | ICD-10-CM | POA: Diagnosis not present

## 2013-03-09 DIAGNOSIS — E119 Type 2 diabetes mellitus without complications: Secondary | ICD-10-CM | POA: Diagnosis not present

## 2013-03-09 DIAGNOSIS — I10 Essential (primary) hypertension: Secondary | ICD-10-CM | POA: Insufficient documentation

## 2013-03-09 DIAGNOSIS — Z91040 Latex allergy status: Secondary | ICD-10-CM | POA: Insufficient documentation

## 2013-03-09 DIAGNOSIS — R51 Headache: Secondary | ICD-10-CM | POA: Diagnosis not present

## 2013-03-09 DIAGNOSIS — IMO0001 Reserved for inherently not codable concepts without codable children: Secondary | ICD-10-CM | POA: Diagnosis not present

## 2013-03-09 DIAGNOSIS — F411 Generalized anxiety disorder: Secondary | ICD-10-CM | POA: Insufficient documentation

## 2013-03-09 DIAGNOSIS — E785 Hyperlipidemia, unspecified: Secondary | ICD-10-CM | POA: Insufficient documentation

## 2013-03-09 DIAGNOSIS — M129 Arthropathy, unspecified: Secondary | ICD-10-CM | POA: Diagnosis not present

## 2013-03-09 DIAGNOSIS — K219 Gastro-esophageal reflux disease without esophagitis: Secondary | ICD-10-CM | POA: Insufficient documentation

## 2013-03-09 DIAGNOSIS — Z87820 Personal history of traumatic brain injury: Secondary | ICD-10-CM | POA: Diagnosis not present

## 2013-03-09 DIAGNOSIS — R351 Nocturia: Secondary | ICD-10-CM | POA: Insufficient documentation

## 2013-03-09 DIAGNOSIS — Z888 Allergy status to other drugs, medicaments and biological substances status: Secondary | ICD-10-CM | POA: Insufficient documentation

## 2013-03-09 LAB — BASIC METABOLIC PANEL
CO2: 26 mEq/L (ref 19–32)
Calcium: 9 mg/dL (ref 8.4–10.5)
Chloride: 104 mEq/L (ref 96–112)
Glucose, Bld: 85 mg/dL (ref 70–99)
Potassium: 4.5 mEq/L (ref 3.5–5.1)
Sodium: 138 mEq/L (ref 135–145)

## 2013-03-09 LAB — CBC
HCT: 35.7 % — ABNORMAL LOW (ref 36.0–46.0)
Hemoglobin: 12.4 g/dL (ref 12.0–15.0)
MCH: 29.4 pg (ref 26.0–34.0)
MCV: 84.6 fL (ref 78.0–100.0)
RBC: 4.22 MIL/uL (ref 3.87–5.11)

## 2013-03-09 LAB — GLUCOSE, CAPILLARY
Glucose-Capillary: 84 mg/dL (ref 70–99)
Glucose-Capillary: 72 mg/dL (ref 70–99)

## 2013-03-09 MED ORDER — FENTANYL CITRATE 0.05 MG/ML IJ SOLN
INTRAMUSCULAR | Status: AC
  Filled 2013-03-09: qty 4

## 2013-03-09 MED ORDER — FENTANYL CITRATE 0.05 MG/ML IJ SOLN
INTRAMUSCULAR | Status: AC | PRN
  Administered 2013-03-09: 25 ug via INTRAVENOUS

## 2013-03-09 MED ORDER — SODIUM CHLORIDE 0.9 % IV SOLN: Freq: Once | INTRAVENOUS | Status: DC

## 2013-03-09 MED ORDER — MIDAZOLAM HCL 2 MG/2ML IJ SOLN
INTRAMUSCULAR | Status: AC
  Filled 2013-03-09: qty 4

## 2013-03-09 MED ORDER — MIDAZOLAM HCL 2 MG/2ML IJ SOLN
INTRAMUSCULAR | Status: AC | PRN
  Administered 2013-03-09: 1 mg via INTRAVENOUS

## 2013-03-09 MED ORDER — HEPARIN SODIUM (PORCINE) 1000 UNIT/ML IJ SOLN
INTRAMUSCULAR | Status: AC | PRN
  Administered 2013-03-09 (×2): 500 [IU] via INTRAVENOUS

## 2013-03-09 MED ORDER — IOHEXOL 300 MG/ML  SOLN
150.0000 mL | Freq: Once | INTRAMUSCULAR | Status: AC | PRN
  Administered 2013-03-09: 70 mL via INTRAVENOUS

## 2013-03-09 NOTE — ED Notes (Signed)
Patient denies pain and is resting comfortably.  

## 2013-03-09 NOTE — H&P (Signed)
Mallory Mendez is an 57 y.o. female.   Chief Complaint: Pt with Hx Basilar tip aneurysm coiling/stent performed 07/2010. Was scheduled for recheck arteriogram 03/02/13 and procedure was postponed due to confused state; slurred speech; unsteady gait. MRI that day revealed:  IMPRESSION: Chronic microvascular ischemic change in the white matter and pons.  No acute infarct or mass.  Rescheduled to 9/5. Pt is more alert today. Still slow speech but better per dtr. Gait improving per pt and dtr. Now scheduled for cerebral arteriogram HPI: Basilar tip aneurysm coil/stent; HLD; DM; HTN; Concussion/brain injury; fibromyalgia  Past Medical History  Diagnosis Date  . Diabetes mellitus   . Hyperlipidemia   . Blood transfusion   . Arthritis   . History of back surgery     multiple  . Hypertension   . Hernia     incisional  . Wears glasses   . MRSA (methicillin resistant Staphylococcus aureus)   . Full dentures   . Chronic constipation   . Umbilical hernia   . Headache(784.0)   . Dizziness   . Concussion     HAS HAD DIZZINESS/ MEMORY PROBLEMS/ SLOW SPEACH /BALANCE PROBLEMS SINCE CONCUSSION 1 1/2 YR AGO PR GOES TO THERAPY  . Fibromyalgia   . Scar   . GERD (gastroesophageal reflux disease)   . Chronic stomach ulcer   . Nocturia   . Sleep difficulties   . Depression   . Anxiety   . Brain injury     1 1/2 YR AGO  . Concussion   . Diabetes mellitus   . Paroxysmal vertigo     Past Surgical History  Procedure Laterality Date  . Cholecystectomy  1984  . Spinal fusion  2006  . Eye surgery  2010    bilateral  . Abdominal hysterectomy    . Cerebral aneurysm repair    . Back surgery      SPINAL FUSION X6   . Incisional hernia repair  05/19/2011    Procedure: HERNIA REPAIR INCISIONAL;  Surgeon: Iona Coach, MD;  Location: [facility];  Service: General;  Laterality: N/A;  repair incisional hernia with mesh  . Hernia repair      09/21/2010  . Hernia repair  05/19/11   incisional hernia repair     Family History  Problem Relation Age of Onset  . Cancer Mother     breast   Social History:  reports that she has been smoking.  She has never used smokeless tobacco. She reports that she does not drink alcohol or use illicit drugs.  Allergies:  Allergies  Allergen Reactions  . Cymbalta [Duloxetine Hcl] Anaphylaxis  . Latex Anaphylaxis     (Not in a hospital admission)  Results for orders placed during the hospital encounter of 03/09/13 (from the past 48 hour(s))  GLUCOSE, CAPILLARY     Status: None   Collection Time    03/09/13  8:25 AM      Result Value Range   Glucose-Capillary 84  70 - 99 mg/dL  BASIC METABOLIC PANEL     Status: None   Collection Time    03/09/13  8:30 AM      Result Value Range   Sodium 138  135 - 145 mEq/L   Potassium 4.5  3.5 - 5.1 mEq/L   Chloride 104  96 - 112 mEq/L   CO2 26  19 - 32 mEq/L   Glucose, Bld 85  70 - 99 mg/dL   BUN 15  6 - 23  mg/dL   Creatinine, Ser 0.86  0.50 - 1.10 mg/dL   Calcium 9.0  8.4 - 57.8 mg/dL   GFR calc non Af Amer >90  >90 mL/min   GFR calc Af Amer >90  >90 mL/min   Comment: (NOTE)     The eGFR has been calculated using the CKD EPI equation.     This calculation has not been validated in all clinical situations.     eGFR's persistently <90 mL/min signify possible Chronic Kidney     Disease.  CBC     Status: Abnormal   Collection Time    03/09/13  8:30 AM      Result Value Range   WBC 7.3  4.0 - 10.5 K/uL   RBC 4.22  3.87 - 5.11 MIL/uL   Hemoglobin 12.4  12.0 - 15.0 g/dL   HCT 46.9 (*) 62.9 - 52.8 %   MCV 84.6  78.0 - 100.0 fL   MCH 29.4  26.0 - 34.0 pg   MCHC 34.7  30.0 - 36.0 g/dL   RDW 41.3  24.4 - 01.0 %   Platelets 161  150 - 400 K/uL  PROTIME-INR     Status: None   Collection Time    03/09/13  8:30 AM      Result Value Range   Prothrombin Time 13.1  11.6 - 15.2 seconds   INR 1.01  0.00 - 1.49   No results found.  Review of Systems  Constitutional: Negative for  fever and weight loss.  HENT: Negative for neck pain.   Eyes: Negative for blurred vision and double vision.  Respiratory: Negative for shortness of breath.   Cardiovascular: Negative for chest pain.  Gastrointestinal: Negative for nausea, vomiting and abdominal pain.  Musculoskeletal: Negative for myalgias.  Neurological: Positive for dizziness and headaches. Negative for weakness.  Psychiatric/Behavioral:       Smoker    Blood pressure 124/76, pulse 83, temperature 98.1 F (36.7 C), temperature source Oral, resp. rate 18, height 5\' 3"  (1.6 m), weight 143 lb (64.864 kg), SpO2 100.00%. Physical Exam  Constitutional: She is oriented to person, place, and time. She appears well-nourished.  Eyes: EOM are normal.  Cardiovascular: Normal rate, regular rhythm and normal heart sounds.   No murmur heard. Respiratory: Effort normal and breath sounds normal. She has no wheezes.  GI: Soft. Bowel sounds are normal. There is no tenderness.  Musculoskeletal:  Moves all 4s; Gait unsteady-- holds on to walls  Neurological: She is alert and oriented to person, place, and time. Coordination normal.  Answers all questions appropriately Speech still slow- slightly slurred   Skin: Skin is warm and dry.  Psychiatric: Her behavior is normal.  Affect seems "confuse" yet appropriate Thought is delayed     Assessment/Plan Hx: basilar artery tip aneurysm coiling/stent 07/2010 Was scheduled for re check cerebral arteriogram 03/02/13; pt was noted to have slurred speech; unsteady gait and slight confusion MRI was performed which showed prob new CVA Rescheduled to today Pt seems better- sxs improving per dtr. Scheduled now for cerebral arteriogram ptr and dtr aware of procedure benefits and risks and agreeable to proceed Consent signed and in chart  Zanya Lindo A 03/09/2013, 9:28 AM

## 2013-03-09 NOTE — Procedures (Signed)
S/P 4 vessel cerebral arteripgram. RT CFA approach.. Findings. 1.Obliterated distal basilar artery aneurysm,with no compaction,o recanalisation. 2.Patent stent

## 2013-03-12 ENCOUNTER — Other Ambulatory Visit (HOSPITAL_COMMUNITY): Payer: Self-pay | Admitting: Interventional Radiology

## 2013-03-13 DIAGNOSIS — R079 Chest pain, unspecified: Secondary | ICD-10-CM | POA: Diagnosis not present

## 2013-03-13 DIAGNOSIS — M549 Dorsalgia, unspecified: Secondary | ICD-10-CM | POA: Diagnosis not present

## 2013-03-14 ENCOUNTER — Ambulatory Visit
Admission: RE | Admit: 2013-03-14 | Discharge: 2013-03-14 | Disposition: A | Discharge status: Home / Self Care | Payer: Medicare Other | Source: Ambulatory Visit | Attending: Family Medicine | Admitting: Family Medicine

## 2013-03-14 ENCOUNTER — Other Ambulatory Visit: Payer: Self-pay | Admitting: Family Medicine

## 2013-03-14 DIAGNOSIS — R079 Chest pain, unspecified: Secondary | ICD-10-CM

## 2013-03-20 DIAGNOSIS — R079 Chest pain, unspecified: Secondary | ICD-10-CM | POA: Diagnosis not present

## 2013-03-20 DIAGNOSIS — Z9889 Other specified postprocedural states: Secondary | ICD-10-CM | POA: Diagnosis not present

## 2013-03-20 DIAGNOSIS — E119 Type 2 diabetes mellitus without complications: Secondary | ICD-10-CM | POA: Diagnosis not present

## 2013-03-20 DIAGNOSIS — R0602 Shortness of breath: Secondary | ICD-10-CM | POA: Diagnosis not present

## 2013-03-20 DIAGNOSIS — E785 Hyperlipidemia, unspecified: Secondary | ICD-10-CM | POA: Diagnosis not present

## 2013-03-26 ENCOUNTER — Encounter
Payer: Medicare Other | Attending: Physical Medicine and Rehabilitation | Admitting: Physical Medicine and Rehabilitation

## 2013-03-26 ENCOUNTER — Encounter: Payer: Self-pay | Admitting: Physical Medicine and Rehabilitation

## 2013-03-26 VITALS — BP 118/59 | HR 83 | Resp 14 | Ht 63.0 in | Wt 143.0 lb

## 2013-03-26 DIAGNOSIS — I10 Essential (primary) hypertension: Secondary | ICD-10-CM | POA: Insufficient documentation

## 2013-03-26 DIAGNOSIS — R413 Other amnesia: Secondary | ICD-10-CM | POA: Insufficient documentation

## 2013-03-26 DIAGNOSIS — Z981 Arthrodesis status: Secondary | ICD-10-CM | POA: Insufficient documentation

## 2013-03-26 DIAGNOSIS — R42 Dizziness and giddiness: Secondary | ICD-10-CM | POA: Diagnosis not present

## 2013-03-26 DIAGNOSIS — I651 Occlusion and stenosis of basilar artery: Secondary | ICD-10-CM | POA: Insufficient documentation

## 2013-03-26 DIAGNOSIS — E785 Hyperlipidemia, unspecified: Secondary | ICD-10-CM | POA: Diagnosis not present

## 2013-03-26 DIAGNOSIS — F329 Major depressive disorder, single episode, unspecified: Secondary | ICD-10-CM | POA: Insufficient documentation

## 2013-03-26 DIAGNOSIS — M545 Low back pain, unspecified: Secondary | ICD-10-CM | POA: Diagnosis not present

## 2013-03-26 DIAGNOSIS — E119 Type 2 diabetes mellitus without complications: Secondary | ICD-10-CM | POA: Diagnosis not present

## 2013-03-26 DIAGNOSIS — H8110 Benign paroxysmal vertigo, unspecified ear: Secondary | ICD-10-CM | POA: Diagnosis not present

## 2013-03-26 DIAGNOSIS — R41840 Attention and concentration deficit: Secondary | ICD-10-CM | POA: Diagnosis not present

## 2013-03-26 DIAGNOSIS — G8929 Other chronic pain: Secondary | ICD-10-CM | POA: Insufficient documentation

## 2013-03-26 DIAGNOSIS — K219 Gastro-esophageal reflux disease without esophagitis: Secondary | ICD-10-CM | POA: Insufficient documentation

## 2013-03-26 DIAGNOSIS — F17200 Nicotine dependence, unspecified, uncomplicated: Secondary | ICD-10-CM | POA: Insufficient documentation

## 2013-03-26 DIAGNOSIS — F411 Generalized anxiety disorder: Secondary | ICD-10-CM | POA: Diagnosis not present

## 2013-03-26 DIAGNOSIS — F3289 Other specified depressive episodes: Secondary | ICD-10-CM | POA: Insufficient documentation

## 2013-03-26 DIAGNOSIS — G479 Sleep disorder, unspecified: Secondary | ICD-10-CM | POA: Diagnosis not present

## 2013-03-26 DIAGNOSIS — F0781 Postconcussional syndrome: Secondary | ICD-10-CM

## 2013-03-26 DIAGNOSIS — G4733 Obstructive sleep apnea (adult) (pediatric): Secondary | ICD-10-CM | POA: Diagnosis not present

## 2013-03-26 MED ORDER — METHYLPHENIDATE HCL 10 MG PO TABS: 15.0000 mg | ORAL_TABLET | Freq: Two times a day (BID) | ORAL | Status: DC

## 2013-03-26 NOTE — Progress Notes (Signed)
Subjective:    Patient ID: Mallory Mendez, female    DOB: Dec 12, 1955, 57 y.o.   MRN: [id number]  HPI Mallory Mendez is back regarding her post concussion syndrome, chronic balance problems. She has also struggles with back pain, Hx of PSF. She has finished Therapy  regarding her vestibular issues. They feel she remains at severe risk for falling. She has had falls and near falls already. She has to furniture walk or have a family member at her side to maintain balance. While a walker helps somewhat, her ataxia is so severe that it won't provide adequate support.  Her memory comes and goes. She states, that she sometimes forgets to take her medication.  Her problem has been stable. She states that she will get her electric wheel chair tomorrow.  Pain Inventory Average Pain 6 Pain Right Now 4 My pain is intermittent  In the last 24 hours, has pain interfered with the following? General activity 5 Relation with others 4 Enjoyment of life 5 What TIME of day is your pain at its worst? daytime Sleep (in general) Fair  Pain is worse with: walking, bending, standing and some activites Pain improves with: n/a Relief from Meds: n/a  Mobility walk with assistance ability to climb steps?  no needs help with transfers Do you have any goals in this area?  yes  Function Do you have any goals in this area?  yes  Neuro/Psych bladder control problems weakness numbness trouble walking dizziness confusion  Prior Studies CT/MRI  Physicians involved in your care Dr Thomasena Edis   Family History  Problem Relation Age of Onset  . Cancer Mother     breast   History   Social History  . Marital Status: Married    Spouse Name: N/A    Number of Children: N/A  . Years of Education: N/A   Social History Main Topics  . Smoking status: Current Every Day Smoker -- 1.00 packs/day  . Smokeless tobacco: Never Used  . Alcohol Use: No  . Drug Use: No  . Sexual Activity: None   Other Topics Concern   . None   Social History Narrative  . None   Past Surgical History  Procedure Laterality Date  . Cholecystectomy  1984  . Spinal fusion  2006  . Eye surgery  2010    bilateral  . Abdominal hysterectomy    . Cerebral aneurysm repair    . Back surgery      SPINAL FUSION X6   . Incisional hernia repair  05/19/2011    Procedure: HERNIA REPAIR INCISIONAL;  Surgeon: Iona Coach, MD;  Location: [facility];  Service: General;  Laterality: N/A;  repair incisional hernia with mesh  . Hernia repair      09/21/2010  . Hernia repair  05/19/11    incisional hernia repair    Past Medical History  Diagnosis Date  . Diabetes mellitus   . Hyperlipidemia   . Blood transfusion   . Arthritis   . History of back surgery     multiple  . Hypertension   . Hernia     incisional  . Wears glasses   . MRSA (methicillin resistant Staphylococcus aureus)   . Full dentures   . Chronic constipation   . Umbilical hernia   . Headache(784.0)   . Dizziness   . Concussion     HAS HAD DIZZINESS/ MEMORY PROBLEMS/ SLOW SPEACH /BALANCE PROBLEMS SINCE CONCUSSION 1 1/2 YR AGO PR GOES TO THERAPY  . Fibromyalgia   .  Scar   . GERD (gastroesophageal reflux disease)   . Chronic stomach ulcer   . Nocturia   . Sleep difficulties   . Depression   . Anxiety   . Brain injury     1 1/2 YR AGO  . Concussion   . Diabetes mellitus   . Paroxysmal vertigo    BP 118/59  Pulse 83  Resp 14  Ht 5\' 3"  (1.6 m)  Wt 143 lb (64.864 kg)  BMI 25.34 kg/m2  SpO2 99%     Review of Systems  Gastrointestinal: Positive for nausea and diarrhea.  Neurological: Positive for dizziness, weakness and numbness.  Psychiatric/Behavioral: Positive for confusion.  All other systems reviewed and are negative.       Objective:   Physical Exam Constitutional: She is oriented to person, place, and time. She appears well-developed and well-nourished.  HENT:  Head: Normocephalic and atraumatic.  Neck: Normal range of motion.   Pulmonary/Chest: Effort normal.  Neurological: She is alert and oriented to person, place, and time.  Balance is still imparied and she uses the walls for balance. With any type of gaze, she begins to experience vertigo. She is at high fall risk  Skin: Skin is warm.   Psychiatric: Her speech is normal. Judgment and thought processing were much improved. Her attention and awarenss are better but remain impaired. She has difficulty staying focused during simple commands and tasks. She still has some short term memory problems.  Symmetric normal motor tone is noted throughout. Normal muscle bulk. Muscle testing reveals 5/5 muscle strength of the upper extremity, and 5/5 of the lower extremity on the right. LUE 4/5, LLE 3+/5. Full range of motion in upper and lower extremities. ROM of spine is restricted.  Patient arises from chair with difficulty. Wide based gait with severe balance problems, only able to walk with a person.         Assessment & Plan:  1. Postconcussion syndrome with positional vertigo and persistent  vestibular symptoms, attention deficit, memory and sleep  dysfunction, anxiety, and depression. There continue to be large family psychosocial factors at play. There always appears to be some kind of crisis taking place in her life at any given time.  2. Chronic low back pain, Hx of PSF.  3. Basilar artery stenosis.  PLAN:  1. We will stay with Ritalin 15mg  at the 7:00 am and 12 noon . Continue exelon for memory 1.5mg  bid  2. Discussed mobility options with this patient. Given her multiple medical issues which include her head trauma with post-concussion syndrome, basilar artery stenosis, and post-laminectomy syndrome with chronic low back pain, a powered wheelchair is her best option. Her balance is NOT improving and she is at EXTREME risk for falling. Her back is too painful to propel a manual wheelchair around or home or in the community. She would have difficulty with a scooter  because of her back pain as a scooter provides no lumbar support. Mallory Mendez is motivated and competent to use a powered wheelchair in the home and in the community. It would allow her to safely perform ADL's and would minimize her pain levels as well. Mallory Mendez wants to pursue a powered wheelchair at this time. She has not received one yet, but she states, that she will get it tomorrow ! 3.We will stay with Topamax for headaches at 150mg  qhs.  5. Importance of vestibular rehab to address her vestibular sx. She was released from PT, they thought that they could not do  more for her . Today the patient states, that she is still doing the same concerning her balance. I advised her to give [place] a call if she thinks her balance is getting worse, and we will consider prescribing PT again. 6. Follow up in 2 month or earlier if necessary.

## 2013-03-26 NOTE — Patient Instructions (Signed)
Try to stay as active as tolerated and as it is safe. 

## 2013-03-27 DIAGNOSIS — Z79899 Other long term (current) drug therapy: Secondary | ICD-10-CM | POA: Diagnosis not present

## 2013-03-27 DIAGNOSIS — M545 Low back pain, unspecified: Secondary | ICD-10-CM | POA: Diagnosis not present

## 2013-03-27 DIAGNOSIS — R269 Unspecified abnormalities of gait and mobility: Secondary | ICD-10-CM | POA: Diagnosis not present

## 2013-03-27 DIAGNOSIS — G894 Chronic pain syndrome: Secondary | ICD-10-CM | POA: Diagnosis not present

## 2013-04-05 ENCOUNTER — Telehealth: Payer: Self-pay

## 2013-04-05 DIAGNOSIS — F0781 Postconcussional syndrome: Secondary | ICD-10-CM

## 2013-04-05 MED ORDER — RIVASTIGMINE TARTRATE 1.5 MG PO CAPS: 1.5000 mg | ORAL_CAPSULE | Freq: Two times a day (BID) | ORAL | Status: DC

## 2013-04-05 MED ORDER — GABAPENTIN 600 MG PO TABS: 600.0000 mg | ORAL_TABLET | Freq: Two times a day (BID) | ORAL | Status: DC

## 2013-04-05 MED ORDER — TOPIRAMATE 100 MG PO TABS: 150.0000 mg | ORAL_TABLET | Freq: Every day | ORAL | Status: DC

## 2013-04-05 NOTE — Telephone Encounter (Signed)
Refill request for gabapentin, rivastigmine, and topiramate to [facility].  Please advise.

## 2013-04-05 NOTE — Telephone Encounter (Signed)
Done

## 2013-04-06 DIAGNOSIS — R0602 Shortness of breath: Secondary | ICD-10-CM | POA: Diagnosis not present

## 2013-04-06 DIAGNOSIS — Z8673 Personal history of transient ischemic attack (TIA), and cerebral infarction without residual deficits: Secondary | ICD-10-CM | POA: Diagnosis not present

## 2013-04-06 DIAGNOSIS — R079 Chest pain, unspecified: Secondary | ICD-10-CM | POA: Diagnosis not present

## 2013-04-25 DIAGNOSIS — M545 Low back pain, unspecified: Secondary | ICD-10-CM | POA: Diagnosis not present

## 2013-04-25 DIAGNOSIS — G894 Chronic pain syndrome: Secondary | ICD-10-CM | POA: Diagnosis not present

## 2013-04-25 DIAGNOSIS — Z79899 Other long term (current) drug therapy: Secondary | ICD-10-CM | POA: Diagnosis not present

## 2013-04-25 DIAGNOSIS — M25519 Pain in unspecified shoulder: Secondary | ICD-10-CM | POA: Diagnosis not present

## 2013-05-02 DIAGNOSIS — Z23 Encounter for immunization: Secondary | ICD-10-CM | POA: Diagnosis not present

## 2013-05-02 DIAGNOSIS — E785 Hyperlipidemia, unspecified: Secondary | ICD-10-CM | POA: Diagnosis not present

## 2013-05-03 DIAGNOSIS — R079 Chest pain, unspecified: Secondary | ICD-10-CM | POA: Diagnosis not present

## 2013-05-03 DIAGNOSIS — R0602 Shortness of breath: Secondary | ICD-10-CM | POA: Diagnosis not present

## 2013-05-24 ENCOUNTER — Encounter: Payer: Self-pay | Admitting: Physical Medicine and Rehabilitation

## 2013-05-24 ENCOUNTER — Encounter
Payer: Medicare Other | Attending: Physical Medicine and Rehabilitation | Admitting: Physical Medicine and Rehabilitation

## 2013-05-24 VITALS — BP 133/79 | HR 84 | Resp 14 | Ht 64.0 in | Wt 157.4 lb

## 2013-05-24 DIAGNOSIS — F17200 Nicotine dependence, unspecified, uncomplicated: Secondary | ICD-10-CM | POA: Diagnosis not present

## 2013-05-24 DIAGNOSIS — M545 Low back pain, unspecified: Secondary | ICD-10-CM | POA: Diagnosis not present

## 2013-05-24 DIAGNOSIS — F0781 Postconcussional syndrome: Secondary | ICD-10-CM | POA: Insufficient documentation

## 2013-05-24 DIAGNOSIS — Z79899 Other long term (current) drug therapy: Secondary | ICD-10-CM | POA: Diagnosis not present

## 2013-05-24 DIAGNOSIS — F411 Generalized anxiety disorder: Secondary | ICD-10-CM | POA: Insufficient documentation

## 2013-05-24 DIAGNOSIS — I651 Occlusion and stenosis of basilar artery: Secondary | ICD-10-CM | POA: Diagnosis not present

## 2013-05-24 DIAGNOSIS — G8929 Other chronic pain: Secondary | ICD-10-CM | POA: Diagnosis not present

## 2013-05-24 DIAGNOSIS — M25519 Pain in unspecified shoulder: Secondary | ICD-10-CM | POA: Diagnosis not present

## 2013-05-24 DIAGNOSIS — F3289 Other specified depressive episodes: Secondary | ICD-10-CM | POA: Insufficient documentation

## 2013-05-24 DIAGNOSIS — F329 Major depressive disorder, single episode, unspecified: Secondary | ICD-10-CM | POA: Insufficient documentation

## 2013-05-24 DIAGNOSIS — M961 Postlaminectomy syndrome, not elsewhere classified: Secondary | ICD-10-CM

## 2013-05-24 DIAGNOSIS — R279 Unspecified lack of coordination: Secondary | ICD-10-CM | POA: Diagnosis not present

## 2013-05-24 DIAGNOSIS — Z981 Arthrodesis status: Secondary | ICD-10-CM | POA: Insufficient documentation

## 2013-05-24 DIAGNOSIS — H8110 Benign paroxysmal vertigo, unspecified ear: Secondary | ICD-10-CM | POA: Diagnosis not present

## 2013-05-24 DIAGNOSIS — R51 Headache: Secondary | ICD-10-CM | POA: Insufficient documentation

## 2013-05-24 DIAGNOSIS — Z9181 History of falling: Secondary | ICD-10-CM | POA: Insufficient documentation

## 2013-05-24 DIAGNOSIS — R42 Dizziness and giddiness: Secondary | ICD-10-CM

## 2013-05-24 DIAGNOSIS — G894 Chronic pain syndrome: Secondary | ICD-10-CM | POA: Diagnosis not present

## 2013-05-24 DIAGNOSIS — R413 Other amnesia: Secondary | ICD-10-CM | POA: Insufficient documentation

## 2013-05-24 MED ORDER — METHYLPHENIDATE HCL 10 MG PO TABS: 15.0000 mg | ORAL_TABLET | Freq: Two times a day (BID) | ORAL | Status: DC

## 2013-05-24 NOTE — Progress Notes (Signed)
Subjective:    Patient ID: Mallory Mendez, female    DOB: 07/26/1955, 57 y.o.   MRN: [id number]  HPI Mallory Mendez is back regarding her post concussion syndrome, chronic balance problems. She has also struggles with back pain, Hx of PSF. She has finished Therapy regarding her vestibular issues. They feel she remains at severe risk for falling. She has had falls and near falls already. She has to furniture walk or have a family member at her side to maintain balance. While a walker helps somewhat, her ataxia is so severe that it won't provide adequate support.  Her memory comes and goes. She states, that she sometimes forgets to take her medication.  Her problem has been stable. She states that she has her electric wheel chair now, and is happy about this .  Pain Inventory Average Pain 6 Pain Right Now 6 My pain is intermittent  In the last 24 hours, has pain interfered with the following? General activity 7 Relation with others 5 Enjoyment of life 5 What TIME of day is your pain at its worst? evening Sleep (in general) Fair  Pain is worse with: walking, bending and some activites Pain improves with: medication Relief from Meds: 7  Mobility walk with assistance use a walker ability to climb steps?  no do you drive?  no use a wheelchair transfers alone  Function disabled: date disabled na I need assistance with the following:  household duties and shopping  Neuro/Psych weakness numbness trouble walking dizziness confusion  Prior Studies Any changes since last visit?  no  Physicians involved in your care Any changes since last visit?  no   Family History  Problem Relation Age of Onset  . Cancer Mother     breast   History   Social History  . Marital Status: Married    Spouse Name: N/A    Number of Children: N/A  . Years of Education: N/A   Social History Main Topics  . Smoking status: Current Every Day Smoker -- 1.00 packs/day  . Smokeless tobacco: Never Used   . Alcohol Use: No  . Drug Use: No  . Sexual Activity: None   Other Topics Concern  . None   Social History Narrative  . None   Past Surgical History  Procedure Laterality Date  . Cholecystectomy  1984  . Spinal fusion  2006  . Eye surgery  2010    bilateral  . Abdominal hysterectomy    . Cerebral aneurysm repair    . Back surgery      SPINAL FUSION X6   . Incisional hernia repair  05/19/2011    Procedure: HERNIA REPAIR INCISIONAL;  Surgeon: Iona Coach, MD;  Location: [facility];  Service: General;  Laterality: N/A;  repair incisional hernia with mesh  . Hernia repair      09/21/2010  . Hernia repair  05/19/11    incisional hernia repair    Past Medical History  Diagnosis Date  . Diabetes mellitus   . Hyperlipidemia   . Blood transfusion   . Arthritis   . History of back surgery     multiple  . Hypertension   . Hernia     incisional  . Wears glasses   . MRSA (methicillin resistant Staphylococcus aureus)   . Full dentures   . Chronic constipation   . Umbilical hernia   . Headache(784.0)   . Dizziness   . Concussion     HAS HAD DIZZINESS/ MEMORY PROBLEMS/ SLOW SPEACH /  BALANCE PROBLEMS SINCE CONCUSSION 1 1/2 YR AGO PR GOES TO THERAPY  . Fibromyalgia   . Scar   . GERD (gastroesophageal reflux disease)   . Chronic stomach ulcer   . Nocturia   . Sleep difficulties   . Depression   . Anxiety   . Brain injury     1 1/2 YR AGO  . Concussion   . Diabetes mellitus   . Paroxysmal vertigo    BP 133/79  Pulse 84  Resp 14  Ht 5\' 4"  (1.626 m)  Wt 157 lb 6.4 oz (71.396 kg)  BMI 27.00 kg/m2  SpO2 97%     Review of Systems  Musculoskeletal: Positive for gait problem.  Neurological: Positive for dizziness, weakness and numbness.  Psychiatric/Behavioral: Positive for confusion.  All other systems reviewed and are negative.       Objective:   Physical Exam Constitutional: She is oriented to person, place, and time. She appears well-developed and  well-nourished.  HENT:  Head: Normocephalic and atraumatic.  Neck: Normal range of motion.  Pulmonary/Chest: Effort normal.  Neurological: She is alert and oriented to person, place, and time.  Balance is still imparied and she uses the walls for balance. With any type of gaze, she begins to experience vertigo. She is at high fall risk  Skin: Skin is warm.   Psychiatric: Her speech is normal. Judgment and thought processing were much improved. Her attention and awarenss are better but remain impaired. She has difficulty staying focused during simple commands and tasks. She still has some short term memory problems.  Symmetric normal motor tone is noted throughout. Normal muscle bulk. Muscle testing reveals 5/5 muscle strength of the upper extremity, and 5/5 of the lower extremity on the right. LUE 4/5, LLE 3+/5. Full range of motion in upper and lower extremities. ROM of spine is restricted.  Patient arises from chair with difficulty. Wide based gait with severe balance problems, only able to walk with a person.         Assessment & Plan:  1. Postconcussion syndrome with positional vertigo and persistent  vestibular symptoms, attention deficit, memory and sleep  dysfunction, anxiety, and depression. There continue to be large family psychosocial factors at play. There always appears to be some kind of crisis taking place in her life at any given time.  2. Chronic low back pain, Hx of PSF.  3. Basilar artery stenosis.  PLAN:  1. We will stay with Ritalin 15mg  at the 7:00 am and 12 noon, refilled her medication today, and provided her with a second Rx to be filled when due . Continue exelon for memory 1.5mg  bid  2. Discussed mobility options with this patient. Given her multiple medical issues which include her head trauma with post-concussion syndrome, basilar artery stenosis, and post-laminectomy syndrome with chronic low back pain, a powered wheelchair is her best option. Her balance is NOT  improving and she is at EXTREME risk for falling. Her back is too painful to propel a manual wheelchair around or home or in the community. She would have difficulty with a scooter because of her back pain as a scooter provides no lumbar support. Mallory Mendez is motivated and competent to use a powered wheelchair in the home and in the community. It would allow her to safely perform ADL's and would minimize her pain levels as well. Ashleyanne wants to pursue a powered wheelchair at this time. She has finally received one, and is happy with it.  3.We will stay  with Topamax for headaches at 150mg  qhs.  5. Importance of vestibular rehab to address her vestibular sx. She was released from PT, they thought that they could not do more for her . Today the patient states, that she is still doing the same concerning her balance. I advised her to give [place] a call if she thinks her balance is getting worse, and we will consider prescribing PT again.  6. Follow up in 2 month or earlier if necessary.

## 2013-05-24 NOTE — Patient Instructions (Signed)
Stay as active as tolerated and as it is safe

## 2013-05-25 DIAGNOSIS — R51 Headache: Secondary | ICD-10-CM | POA: Diagnosis not present

## 2013-05-25 DIAGNOSIS — R0602 Shortness of breath: Secondary | ICD-10-CM | POA: Diagnosis not present

## 2013-05-25 DIAGNOSIS — Z79899 Other long term (current) drug therapy: Secondary | ICD-10-CM | POA: Diagnosis not present

## 2013-05-25 DIAGNOSIS — R079 Chest pain, unspecified: Secondary | ICD-10-CM | POA: Diagnosis not present

## 2013-06-15 DIAGNOSIS — E1142 Type 2 diabetes mellitus with diabetic polyneuropathy: Secondary | ICD-10-CM | POA: Diagnosis not present

## 2013-06-15 DIAGNOSIS — L659 Nonscarring hair loss, unspecified: Secondary | ICD-10-CM | POA: Diagnosis not present

## 2013-06-15 DIAGNOSIS — E1049 Type 1 diabetes mellitus with other diabetic neurological complication: Secondary | ICD-10-CM | POA: Diagnosis not present

## 2013-06-21 DIAGNOSIS — G894 Chronic pain syndrome: Secondary | ICD-10-CM | POA: Diagnosis not present

## 2013-06-21 DIAGNOSIS — Z79899 Other long term (current) drug therapy: Secondary | ICD-10-CM | POA: Diagnosis not present

## 2013-06-21 DIAGNOSIS — M25519 Pain in unspecified shoulder: Secondary | ICD-10-CM | POA: Diagnosis not present

## 2013-06-21 DIAGNOSIS — F17200 Nicotine dependence, unspecified, uncomplicated: Secondary | ICD-10-CM | POA: Diagnosis not present

## 2013-06-22 DIAGNOSIS — M25519 Pain in unspecified shoulder: Secondary | ICD-10-CM | POA: Diagnosis not present

## 2013-07-17 ENCOUNTER — Encounter: Payer: Medicare Other | Attending: Physical Medicine and Rehabilitation | Admitting: Physical Medicine & Rehabilitation

## 2013-07-17 DIAGNOSIS — M545 Low back pain, unspecified: Secondary | ICD-10-CM | POA: Insufficient documentation

## 2013-07-17 DIAGNOSIS — R413 Other amnesia: Secondary | ICD-10-CM | POA: Insufficient documentation

## 2013-07-17 DIAGNOSIS — Z79899 Other long term (current) drug therapy: Secondary | ICD-10-CM | POA: Insufficient documentation

## 2013-07-17 DIAGNOSIS — F329 Major depressive disorder, single episode, unspecified: Secondary | ICD-10-CM | POA: Insufficient documentation

## 2013-07-17 DIAGNOSIS — Z981 Arthrodesis status: Secondary | ICD-10-CM | POA: Insufficient documentation

## 2013-07-17 DIAGNOSIS — F3289 Other specified depressive episodes: Secondary | ICD-10-CM | POA: Insufficient documentation

## 2013-07-17 DIAGNOSIS — R51 Headache: Secondary | ICD-10-CM | POA: Insufficient documentation

## 2013-07-17 DIAGNOSIS — I651 Occlusion and stenosis of basilar artery: Secondary | ICD-10-CM | POA: Insufficient documentation

## 2013-07-17 DIAGNOSIS — R279 Unspecified lack of coordination: Secondary | ICD-10-CM | POA: Insufficient documentation

## 2013-07-17 DIAGNOSIS — F411 Generalized anxiety disorder: Secondary | ICD-10-CM | POA: Insufficient documentation

## 2013-07-17 DIAGNOSIS — G8929 Other chronic pain: Secondary | ICD-10-CM | POA: Insufficient documentation

## 2013-07-17 DIAGNOSIS — Z9181 History of falling: Secondary | ICD-10-CM | POA: Insufficient documentation

## 2013-07-17 DIAGNOSIS — F0781 Postconcussional syndrome: Secondary | ICD-10-CM | POA: Insufficient documentation

## 2013-07-19 DIAGNOSIS — M545 Low back pain, unspecified: Secondary | ICD-10-CM | POA: Diagnosis not present

## 2013-07-19 DIAGNOSIS — Z79899 Other long term (current) drug therapy: Secondary | ICD-10-CM | POA: Diagnosis not present

## 2013-07-19 DIAGNOSIS — G894 Chronic pain syndrome: Secondary | ICD-10-CM | POA: Diagnosis not present

## 2013-07-19 DIAGNOSIS — M25519 Pain in unspecified shoulder: Secondary | ICD-10-CM | POA: Diagnosis not present

## 2013-07-23 ENCOUNTER — Ambulatory Visit: Payer: Medicare Other | Admitting: Physical Medicine & Rehabilitation

## 2013-08-16 DIAGNOSIS — M25519 Pain in unspecified shoulder: Secondary | ICD-10-CM | POA: Diagnosis not present

## 2013-08-20 ENCOUNTER — Encounter: Payer: Medicare Other | Attending: Physical Medicine and Rehabilitation | Admitting: Physical Medicine & Rehabilitation

## 2013-08-20 ENCOUNTER — Encounter: Payer: Self-pay | Admitting: Physical Medicine & Rehabilitation

## 2013-08-20 VITALS — BP 155/80 | HR 105 | Resp 14 | Ht 63.0 in | Wt 163.4 lb

## 2013-08-20 DIAGNOSIS — R42 Dizziness and giddiness: Secondary | ICD-10-CM | POA: Insufficient documentation

## 2013-08-20 DIAGNOSIS — IMO0001 Reserved for inherently not codable concepts without codable children: Secondary | ICD-10-CM | POA: Diagnosis not present

## 2013-08-20 DIAGNOSIS — M7918 Myalgia, other site: Secondary | ICD-10-CM | POA: Insufficient documentation

## 2013-08-20 DIAGNOSIS — G894 Chronic pain syndrome: Secondary | ICD-10-CM | POA: Diagnosis not present

## 2013-08-20 DIAGNOSIS — F419 Anxiety disorder, unspecified: Secondary | ICD-10-CM

## 2013-08-20 DIAGNOSIS — H8110 Benign paroxysmal vertigo, unspecified ear: Secondary | ICD-10-CM | POA: Diagnosis not present

## 2013-08-20 DIAGNOSIS — F341 Dysthymic disorder: Secondary | ICD-10-CM

## 2013-08-20 DIAGNOSIS — F0781 Postconcussional syndrome: Secondary | ICD-10-CM | POA: Diagnosis not present

## 2013-08-20 DIAGNOSIS — M545 Low back pain, unspecified: Secondary | ICD-10-CM | POA: Diagnosis not present

## 2013-08-20 DIAGNOSIS — M25519 Pain in unspecified shoulder: Secondary | ICD-10-CM | POA: Diagnosis not present

## 2013-08-20 DIAGNOSIS — Z79899 Other long term (current) drug therapy: Secondary | ICD-10-CM | POA: Diagnosis not present

## 2013-08-20 DIAGNOSIS — F32A Depression, unspecified: Secondary | ICD-10-CM

## 2013-08-20 DIAGNOSIS — F329 Major depressive disorder, single episode, unspecified: Secondary | ICD-10-CM

## 2013-08-20 MED ORDER — METHYLPHENIDATE HCL 10 MG PO TABS: 15.0000 mg | ORAL_TABLET | Freq: Two times a day (BID) | ORAL | Status: DC

## 2013-08-20 MED ORDER — METHYLPHENIDATE HCL 10 MG PO TABS
15.0000 mg | ORAL_TABLET | Freq: Two times a day (BID) | ORAL | Status: DC
Start: 2013-08-20 — End: 2013-10-17

## 2013-08-20 NOTE — Progress Notes (Signed)
Subjective:    Patient ID: Mallory Mendez, female    DOB: August 30, 1955, 58 y.o.   MRN: [id number]  HPI  Mallory Mendez is back regarding her gait and post-concussion disorders. She has been doing well for the most part. She loves her powered chair.   She has been having a lot of headaches which are posterior in location. They are happening daily usually in the evening hours and are associated with dizziness and nausea. They frequently cause to her vomit, which ultimately improves the symptoms. She has been using ibuprofen, excedrin headache to help, but with really no benefit. Her percocet may help a little.   Pain Inventory Average Pain 6 Pain Right Now 5 My pain is constant and aching  In the last 24 hours, has pain interfered with the following? General activity 8 Relation with others 8 Enjoyment of life 8 What TIME of day is your pain at its worst? daytime and night Sleep (in general) Fair  Pain is worse with: walking, bending and some activites Pain improves with: medication Relief from Meds: 8  Mobility use a walker use a wheelchair  Function disabled: date disabled . I need assistance with the following:  dressing, household duties and shopping  Neuro/Psych bladder control problems dizziness confusion anxiety  Prior Studies Any changes since last visit?  no  Physicians involved in your care Any changes since last visit?  no   Family History  Problem Relation Age of Onset  . Cancer Mother     breast   History   Social History  . Marital Status: Married    Spouse Name: N/A    Number of Children: N/A  . Years of Education: N/A   Social History Main Topics  . Smoking status: Current Every Day Smoker -- 1.00 packs/day  . Smokeless tobacco: Never Used  . Alcohol Use: No  . Drug Use: No  . Sexual Activity: None   Other Topics Concern  . None   Social History Narrative  . None   Past Surgical History  Procedure Laterality Date  . Cholecystectomy   1984  . Spinal fusion  2006  . Eye surgery  2010    bilateral  . Abdominal hysterectomy    . Cerebral aneurysm repair    . Back surgery      SPINAL FUSION X6   . Incisional hernia repair  05/19/2011    Procedure: HERNIA REPAIR INCISIONAL;  Surgeon: Willey Blade, MD;  Location: [facility];  Service: General;  Laterality: N/A;  repair incisional hernia with mesh  . Hernia repair      09/21/2010  . Hernia repair  05/19/11    incisional hernia repair    Past Medical History  Diagnosis Date  . Diabetes mellitus   . Hyperlipidemia   . Blood transfusion   . Arthritis   . History of back surgery     multiple  . Hypertension   . Hernia     incisional  . Wears glasses   . MRSA (methicillin resistant Staphylococcus aureus)   . Full dentures   . Chronic constipation   . Umbilical hernia   . Headache(784.0)   . Dizziness   . Concussion     HAS HAD DIZZINESS/ MEMORY PROBLEMS/ SLOW SPEACH /BALANCE PROBLEMS SINCE CONCUSSION 1 1/2 YR AGO PR GOES TO THERAPY  . Fibromyalgia   . Scar   . GERD (gastroesophageal reflux disease)   . Chronic stomach ulcer   . Nocturia   .  Sleep difficulties   . Depression   . Anxiety   . Brain injury     1 1/2 YR AGO  . Concussion   . Diabetes mellitus   . Paroxysmal vertigo    BP 155/80  Pulse 105  Resp 14  Ht 5\' 3"  (1.6 m)  Wt 163 lb 6.4 oz (74.118 kg)  BMI 28.95 kg/m2  SpO2 99%  Opioid Risk Score:   Fall Risk Score: Moderate Fall Risk (6-13 points) (educated and home fall prevention handout given)  Review of Systems  Gastrointestinal: Positive for nausea and vomiting.  Genitourinary:       Bladder control problems  Neurological: Positive for dizziness and headaches.  Psychiatric/Behavioral: Positive for confusion. The patient is nervous/anxious.   All other systems reviewed and are negative.       Objective:   Physical Exam  Constitutional: She is oriented to person, place, and time. She appears well-developed and  well-nourished.  HENT:  Head: Normocephalic and atraumatic.  Neck: Normal range of motion.  Pulmonary/Chest: Effort normal.  Neurological: She is alert and oriented to person, place, and time.  Balance is still imparied and she uses the walls for balance. With any type of gaze, she begins to experience vertigo. She is at high fall risk  Skin: Skin is warm.   Psychiatric: Her speech is normal. Judgment and thought processing were much improved. Her attention and awarenss are better but remain impaired. She has difficulty staying focused during simple commands and tasks. She still has some short term memory problems.  . Normal muscle bulk. Muscle testing reveals 5/5 muscle strength of the upper extremity, and 5/5 of the lower extremity on the right. LUE 4/5, LLE 3+/5. Full range of motion in upper and lower extremities. ROM of spine is restricted in the neck. She has pain with right neck bending and extension. There is pain over the SCM on the right. Patient arises from chair with difficulty. Wide based gait--using walker..   Assessment & Plan:   1. Postconcussion syndrome with positional vertigo and persistent  vestibular symptoms, attention deficit, memory and sleep  dysfunction, anxiety, and depression.   There always appears to be some kind of crisis taking place in her life at any given time.  2. Chronic low back pain, Hx of PSF.  3. Basilar artery stenosis.  4. Headaches, appear more tension related with overlap of her vestibular symptoms when they worsen.   PLAN:   1. We will continue Ritalin 15mg  at the 7:00 am and 12 noon, refilled her medication today, and provided her with a second Rx to be filled when due . Continue exelon for memory 1.5mg  bid  2. Doing well with powered wheelchair, maintain  3.We will stay with Topamax for headaches at 150mg  qhs.  5. discussed vestibular rehab to address her vestibular sx.  She needs to do these DAILY 6. Utilize heat, ice, stretching for  headaches, good posture. NSAID's are an option as long as she's taking them as directed. 7. Follow up in 2 months.

## 2013-08-20 NOTE — Patient Instructions (Signed)
YOU NEED TO PERFORM YOUR VERTIGO EXERCISES DAILY   PLEASE WORK ON STRETCHING FOR YOUR NECK AS WELL AS HEAT, ICE, GOOD POSTURE---YOU SHOULD BE ABLE TO HELP YOUR HEADACHES BY KEEPING UP WITH THESE ACTIVITIES!!

## 2013-08-23 DIAGNOSIS — M25519 Pain in unspecified shoulder: Secondary | ICD-10-CM | POA: Diagnosis not present

## 2013-08-23 DIAGNOSIS — Z79899 Other long term (current) drug therapy: Secondary | ICD-10-CM | POA: Diagnosis not present

## 2013-08-23 DIAGNOSIS — M545 Low back pain, unspecified: Secondary | ICD-10-CM | POA: Diagnosis not present

## 2013-08-23 DIAGNOSIS — G894 Chronic pain syndrome: Secondary | ICD-10-CM | POA: Diagnosis not present

## 2013-08-30 ENCOUNTER — Other Ambulatory Visit: Payer: Self-pay | Admitting: Physical Medicine & Rehabilitation

## 2013-08-31 ENCOUNTER — Other Ambulatory Visit: Payer: Self-pay

## 2013-08-31 DIAGNOSIS — F0781 Postconcussional syndrome: Secondary | ICD-10-CM

## 2013-08-31 MED ORDER — TOPIRAMATE 100 MG PO TABS: 150.0000 mg | ORAL_TABLET | Freq: Every day | ORAL | Status: DC

## 2013-09-18 DIAGNOSIS — R079 Chest pain, unspecified: Secondary | ICD-10-CM | POA: Diagnosis not present

## 2013-09-18 DIAGNOSIS — R0602 Shortness of breath: Secondary | ICD-10-CM | POA: Diagnosis not present

## 2013-09-18 DIAGNOSIS — E119 Type 2 diabetes mellitus without complications: Secondary | ICD-10-CM | POA: Diagnosis not present

## 2013-09-18 DIAGNOSIS — IMO0001 Reserved for inherently not codable concepts without codable children: Secondary | ICD-10-CM | POA: Diagnosis not present

## 2013-09-20 DIAGNOSIS — M25519 Pain in unspecified shoulder: Secondary | ICD-10-CM | POA: Diagnosis not present

## 2013-09-20 DIAGNOSIS — G894 Chronic pain syndrome: Secondary | ICD-10-CM | POA: Diagnosis not present

## 2013-09-20 DIAGNOSIS — Z79899 Other long term (current) drug therapy: Secondary | ICD-10-CM | POA: Diagnosis not present

## 2013-09-20 DIAGNOSIS — M545 Low back pain, unspecified: Secondary | ICD-10-CM | POA: Diagnosis not present

## 2013-09-21 ENCOUNTER — Ambulatory Visit (HOSPITAL_COMMUNITY)
Admission: RE | Admit: 2013-09-21 | Discharge: 2013-09-21 | Disposition: A | Discharge status: Home / Self Care | Payer: Medicare Other | Source: Ambulatory Visit | Attending: Internal Medicine | Admitting: Internal Medicine

## 2013-09-21 ENCOUNTER — Ambulatory Visit (INDEPENDENT_AMBULATORY_CARE_PROVIDER_SITE_OTHER): Payer: Medicare Other | Admitting: Internal Medicine

## 2013-09-21 ENCOUNTER — Encounter: Payer: Self-pay | Admitting: Internal Medicine

## 2013-09-21 VITALS — BP 140/92 | HR 75 | Temp 98.0°F | Ht 64.0 in | Wt 166.0 lb

## 2013-09-21 DIAGNOSIS — R0989 Other specified symptoms and signs involving the circulatory and respiratory systems: Secondary | ICD-10-CM | POA: Insufficient documentation

## 2013-09-21 DIAGNOSIS — E109 Type 1 diabetes mellitus without complications: Secondary | ICD-10-CM | POA: Diagnosis not present

## 2013-09-21 DIAGNOSIS — M79609 Pain in unspecified limb: Secondary | ICD-10-CM | POA: Insufficient documentation

## 2013-09-21 DIAGNOSIS — I10 Essential (primary) hypertension: Secondary | ICD-10-CM | POA: Diagnosis not present

## 2013-09-21 DIAGNOSIS — R079 Chest pain, unspecified: Secondary | ICD-10-CM | POA: Diagnosis not present

## 2013-09-21 DIAGNOSIS — R0600 Dyspnea, unspecified: Secondary | ICD-10-CM

## 2013-09-21 DIAGNOSIS — F17200 Nicotine dependence, unspecified, uncomplicated: Secondary | ICD-10-CM | POA: Diagnosis not present

## 2013-09-21 DIAGNOSIS — R0609 Other forms of dyspnea: Secondary | ICD-10-CM | POA: Insufficient documentation

## 2013-09-21 MED ORDER — OLMESARTAN MEDOXOMIL 20 MG PO TABS: ORAL_TABLET | ORAL | Status: DC

## 2013-09-21 NOTE — Assessment & Plan Note (Signed)

## 2013-09-21 NOTE — Patient Instructions (Signed)
The key is to stop smoking completely before smoking completely stops you!   Benicar 20 mg one half daily   Try off lisinopril for 6 weeks and let Dr Kenton Kingfisher know if it helps your breathing or coughing  Please remember to go to the  x-ray department at [facility]   - we will call you with the results when they are available.

## 2013-09-21 NOTE — Progress Notes (Signed)
   Subjective:    Patient ID: Mallory Mendez, female    DOB: 18-Jul-1955   MRN: [id number]  HPI  41 yowf smoker with ? Asthma as child on ? chromalyn with freq flares of mostly uri's up to 4 x yearly followed by [name] but referred by [facility] 09/21/2013 for eval of unexplained sob.  09/21/2013 1st [facility] office visit/ Mallory Mendez  Chief Complaint  Patient presents with  . Pulmonary Consult    Referred per Dr. Einar Mendez. Pt c/o fatigue, CP and SOB x 3 months. She states that CP is sometimes sharp, and comes and goes.   stairs are now a problem where they didn't used to be, uses [employer] parking due to back, uses motorized cart in stores due to legs give out before breathing.  Cough severe with uri's, much better sleeping.  No obvious other patterns in day to day or daytime variabilty or assoc clasically pleuritic or ex cp or chest tightness, subjective wheeze overt sinus or hb symptoms. No unusual exp hx or h/o childhood pna/ asthma or knowledge of premature birth.  Sleeping ok without nocturnal  or early am exacerbation  of respiratory  c/o's or need for noct saba. Also denies any obvious fluctuation of symptoms with weather or environmental changes or other aggravating or alleviating factors except as outlined above   Current Medications, Allergies, Complete Past Medical History, Past Surgical History, Family History, and Social History were reviewed in [employer] record.             Review of Systems  Constitutional: Negative for fever, chills and unexpected weight change.  HENT: Negative for congestion, dental problem, ear pain, nosebleeds, postnasal drip, rhinorrhea, sinus pressure, sneezing, sore throat, trouble swallowing and voice change.   Eyes: Negative for visual disturbance.  Respiratory: Positive for shortness of breath. Negative for cough and choking.   Cardiovascular: Positive for chest pain. Negative for leg swelling.  Gastrointestinal: Negative for  vomiting, abdominal pain and diarrhea.  Genitourinary: Negative for difficulty urinating.  Musculoskeletal: Negative for arthralgias.  Skin: Negative for rash.  Neurological: Negative for tremors, syncope and headaches.  Hematological: Does not bruise/bleed easily.       Objective:   Physical Exam  Wt Readings from Last 3 Encounters:  09/21/13 166 lb (75.297 kg)  08/20/13 163 lb 6.4 oz (74.118 kg)  05/24/13 157 lb 6.4 oz (71.396 kg)      HEENT: nl dentition, turbinates, and orophanx. Nl external ear canals without cough reflex   NECK :  without JVD/Nodes/TM/ nl carotid upstrokes bilaterally   LUNGS: no acc muscle use, clear to A and P bilaterally without cough on insp or exp maneuvers   CV:  RRR  no s3 or murmur or increase in P2, no edema   ABD:  soft and nontender with nl excursion in the supine position. No bruits or organomegaly, bowel sounds nl  MS:  warm without deformities, calf tenderness, cyanosis or clubbing  SKIN: warm and dry without lesions    NEURO:  alert, approp, no deficits    CXR  09/21/2013 :   There is no evidence of pneumonia nor CHF nor other acute cardiopulmonary disease.       Assessment & Plan:

## 2013-09-21 NOTE — Assessment & Plan Note (Addendum)
-   09/21/2013  Walked RA x 1/2 laps @ 185 ft each stopped due to sob and lost balance with sats fine - Spirometry 09/21/2013 nl though exp curve in voluntary portion truncated  Clearly Symptoms are markedly disproportionate to objective findings and not clear this is a lung problem but pt does appear to have difficult airway management issues. DDX of  difficult airways managment all start with A and  include Adherence, Ace Inhibitors, Acid Reflux, Active Sinus Disease, Alpha 1 Antitripsin deficiency, Anxiety masquerading as Airways dz,  ABPA,  allergy(esp in young), Aspiration (esp in elderly), Adverse effects of DPI,  Active smokers, plus two Bs  = Bronchiectasis and Beta blocker use..and one C= CHF  Despite absence of classic cough hx, ? acei effect on upper airway, esp with truncation noted on f/v loop > try off acei (see hbp) x 6 week  Anxiety/ deconditioning the other big factor > defer rx to primary care   Active smoker > discussed separately   See the written copy of this report in the patient's paper medical record.  These results did not interface directly into the electronic medical record and are summarized here.

## 2013-09-21 NOTE — Assessment & Plan Note (Addendum)
Try off acei due to unexplained sob 09/21/2013 with truncation on fv c/w ? vcd ?   Comment:  ACE inhibitors are problematic in  pts with airway complaints because  even experienced pulmonologists can't always distinguish ace effects from copd/asthma.  By themselves they don't actually cause a problem, much like oxygen can't by itself start a fire, but they certainly serve as a powerful catalyst or enhancer for any "fire"  or inflammatory process in the upper airway, be it caused by an ET  tube or more commonly reflux (especially in the obese or pts with known GERD or who are on biphoshonates).    In the era of ARB near equivalency until we have a better handle on the reversibility of the airway problem, it just makes sense to avoid ACEI  entirely in the short run and then decide later, having established a level of airway control using a reasonable limited regimen, whether to add back ace but even then being very careful to observe the pt for worsening airway control and number of meds used/ needed to control symptoms.   Try benicar 20 mg one half daily and if better will defer longterm rx to Dr Kenton Kingfisher, if not resume lisinopril

## 2013-09-24 ENCOUNTER — Telehealth: Payer: Self-pay | Admitting: Internal Medicine

## 2013-09-24 NOTE — Progress Notes (Signed)
Quick Note:  LMTCB ______ 

## 2013-09-24 NOTE — Telephone Encounter (Signed)
Advised pt of cxr and pulm test results per MW.  Pt verbalized understanding and had no further questions

## 2013-09-24 NOTE — Telephone Encounter (Signed)
Pt is calling back again...asking to speak w/ [name], please.  Mallory Mendez

## 2013-10-17 ENCOUNTER — Encounter: Payer: Self-pay | Admitting: Physical Medicine & Rehabilitation

## 2013-10-17 ENCOUNTER — Encounter: Payer: Medicare Other | Attending: Physical Medicine and Rehabilitation | Admitting: Physical Medicine & Rehabilitation

## 2013-10-17 VITALS — BP 149/85 | HR 89 | Resp 14 | Ht 64.0 in | Wt 163.0 lb

## 2013-10-17 DIAGNOSIS — M7918 Myalgia, other site: Secondary | ICD-10-CM

## 2013-10-17 DIAGNOSIS — G3189 Other specified degenerative diseases of nervous system: Secondary | ICD-10-CM

## 2013-10-17 DIAGNOSIS — S069X0S Unspecified intracranial injury without loss of consciousness, sequela: Secondary | ICD-10-CM

## 2013-10-17 DIAGNOSIS — IMO0001 Reserved for inherently not codable concepts without codable children: Secondary | ICD-10-CM

## 2013-10-17 DIAGNOSIS — F0781 Postconcussional syndrome: Secondary | ICD-10-CM | POA: Insufficient documentation

## 2013-10-17 DIAGNOSIS — H8110 Benign paroxysmal vertigo, unspecified ear: Secondary | ICD-10-CM | POA: Diagnosis not present

## 2013-10-17 DIAGNOSIS — R42 Dizziness and giddiness: Secondary | ICD-10-CM | POA: Diagnosis not present

## 2013-10-17 DIAGNOSIS — S069XAS Unspecified intracranial injury with loss of consciousness status unknown, sequela: Secondary | ICD-10-CM

## 2013-10-17 DIAGNOSIS — F17200 Nicotine dependence, unspecified, uncomplicated: Secondary | ICD-10-CM

## 2013-10-17 DIAGNOSIS — F09 Unspecified mental disorder due to known physiological condition: Secondary | ICD-10-CM | POA: Insufficient documentation

## 2013-10-17 DIAGNOSIS — S069X9S Unspecified intracranial injury with loss of consciousness of unspecified duration, sequela: Secondary | ICD-10-CM

## 2013-10-17 MED ORDER — SCOPOLAMINE 1 MG/3DAYS TD PT72: 1.0000 | MEDICATED_PATCH | TRANSDERMAL | Status: DC

## 2013-10-17 MED ORDER — METHYLPHENIDATE HCL 20 MG PO TABS: 20.0000 mg | ORAL_TABLET | Freq: Two times a day (BID) | ORAL | Status: DC

## 2013-10-17 MED ORDER — RIVASTIGMINE TARTRATE 3 MG PO CAPS: 3.0000 mg | ORAL_CAPSULE | Freq: Two times a day (BID) | ORAL | Status: DC

## 2013-10-17 MED ORDER — OXYMORPHONE HCL ER 15 MG PO T12A: 15.0000 mg | EXTENDED_RELEASE_TABLET | Freq: Two times a day (BID) | ORAL | Status: DC

## 2013-10-17 NOTE — Patient Instructions (Signed)
YOU NEED TO ESTABLISH A ROUTINE AND USE A REGULAR ORGANIZER TO KEEP YOUR DATES AND TO-DO LIST IN ORDER!!!!!  YOU NEED TO USE YOUR ORGANIZER EVERY DAY   YOU NEED TO DO YOUR VERTIGO EXERCISES EVERY DAY!!!

## 2013-10-17 NOTE — Progress Notes (Signed)
Subjective:    Patient ID: Mallory Mendez, female    DOB: February 09, 1956, 58 y.o.   MRN: [id number]  HPI  Mallory Mendez is back regarding her chronic pain and postconcussion syndrome. She still struggles with memory and focusing. She doesn't have a Occupational hygienist.   She struggles with vertigo and balance still. She is only doing her exercises three x per week.   Her headaches have been unchanged. They are typically triggered by her dizziness and nausea. She sometimes has diplopia.  Her sleep has been good.   Mallory Mendez remains on her opana for pain control.   She is on exelon and ritalin for memory and attention.  Pain Inventory Average Pain 7 Pain Right Now 7 My pain is constant and aching  In the last 24 hours, has pain interfered with the following? General activity 8 Relation with others 8 Enjoyment of life 8 What TIME of day is your pain at its worst? constant all day Sleep (in general) Fair  Pain is worse with: walking and bending Pain improves with: medication Relief from Meds: 8  Mobility walk with assistance use a walker ability to climb steps?  no do you drive?  no use a wheelchair needs help with transfers  Function I need assistance with the following:  dressing and household duties  Neuro/Psych bladder control problems trouble walking dizziness confusion depression  Prior Studies Any changes since last visit?  no  Physicians involved in your care Any changes since last visit?  no   Family History  Problem Relation Age of Onset  . Breast cancer Mother   . Allergies Daughter   . Heart disease Father    History   Social History  . Marital Status: Married    Spouse Name: N/A    Number of Children: N/A  . Years of Education: N/A   Occupational History  . disabled    Social History Main Topics  . Smoking status: Current Every Day Smoker -- 1.00 packs/day for 38 years    Types: Cigarettes  . Smokeless tobacco: Never Used  . Alcohol  Use: No  . Drug Use: No  . Sexual Activity: None   Other Topics Concern  . None   Social History Narrative  . None   Past Surgical History  Procedure Laterality Date  . Cholecystectomy  1984  . Spinal fusion  2006  . Eye surgery  2010    bilateral  . Abdominal hysterectomy    . Cerebral aneurysm repair    . Back surgery      SPINAL FUSION X6   . Incisional hernia repair  05/19/2011    Procedure: HERNIA REPAIR INCISIONAL;  Surgeon: Willey Blade, MD;  Location: [facility];  Service: General;  Laterality: N/A;  repair incisional hernia with mesh  . Hernia repair      09/21/2010  . Hernia repair  05/19/11    incisional hernia repair   . Appendectomy    . Tubal ligation     Past Medical History  Diagnosis Date  . Diabetes mellitus   . Hyperlipidemia   . Blood transfusion   . Arthritis   . History of back surgery     multiple  . Hypertension   . Hernia     incisional  . Wears glasses   . MRSA (methicillin resistant Staphylococcus aureus)   . Full dentures   . Chronic constipation   . Umbilical hernia   . Headache(784.0)   . Dizziness   .  Concussion     HAS HAD DIZZINESS/ MEMORY PROBLEMS/ SLOW SPEACH /BALANCE PROBLEMS SINCE CONCUSSION 1 1/2 YR AGO PR GOES TO THERAPY  . Fibromyalgia   . Scar   . GERD (gastroesophageal reflux disease)   . Chronic stomach ulcer   . Nocturia   . Sleep difficulties   . Depression   . Anxiety   . Brain injury     1 1/2 YR AGO  . Concussion   . Diabetes mellitus   . Paroxysmal vertigo    BP 149/85  Pulse 89  Resp 14  Ht 5\' 4"  (1.626 m)  Wt 163 lb (73.936 kg)  BMI 27.97 kg/m2  SpO2 99%  Opioid Risk Score:   Fall Risk Score: Moderate Fall Risk (6-13 points) (pt educated and given brochure on fall risk previously)   Review of Systems  Genitourinary:       Bladder control problems  Musculoskeletal: Positive for gait problem.  Neurological: Positive for dizziness and headaches.  Psychiatric/Behavioral: Positive for  confusion. The patient is nervous/anxious.        Depression  All other systems reviewed and are negative.      Objective:   Physical Exam  Constitutional: She is oriented to person, place, and time. She appears well-developed and well-nourished.  HENT:  Head: Normocephalic and atraumatic.  Neck: Normal range of motion.  Pulmonary/Chest: Effort normal.  Neurological: She is alert and oriented to person, place, and time.  Balance is still impaired but she uses her walker. She has nystagmus horizontally with gae in either direction Skin: Skin is warm.   Psychiatric: Her speech is normal. Judgment and thought processing were much improved. Her attention and awarenss are better but remain impaired. She has difficulty staying focused during simple commands and tasks. She still has some short term memory problems.   Normal muscle bulk. Muscle testing reveals 5/5 muscle strength of the upper extremity, and 5/5 of the lower extremity on the right. LUE 4/5, LLE 3+/5. Full range of motion in upper and lower extremities. ROM of spine is restricted in the neck. She has pain with right neck bending and extension. There is pain over the SCM on the right.  Patient arises from chair with difficulty. Wide based gait--using walker.  .  Assessment & Plan:   1. Postconcussion syndrome with positional vertigo and persistent  vestibular symptoms, attention deficit, memory and sleep  dysfunction, anxiety, and depression. There always appears to be some kind of crisis taking place in her life at any given time.  2. Chronic low back pain, Hx of PSF.  3. Basilar artery stenosis.  4. Headaches, appear more tension related with overlap of her vestibular symptoms when they worsen.    PLAN:  1. Will increase ritalin at the 7:00 am and 12 noon, refilled her medication today, and provided her with a second Rx to be filled when due . Increase exelon to 3mg  bid (memory) 2. Doing well with powered wheelchair and  rolling walker, maintain  3. We will stay with Topamax for headaches at 150mg  qhs.  5. discussed vestibular exercises---- She needs to do these DAILY  6. Add scopolamine patch to help with persistent vertigo and nausea which hopefully will help her work through the symptoms.   7. Follow up in one month with our NP. 30 minutes of face to face patient care time were spent during this visit. All questions were encouraged and answered.

## 2013-10-22 DIAGNOSIS — M25519 Pain in unspecified shoulder: Secondary | ICD-10-CM | POA: Diagnosis not present

## 2013-10-22 DIAGNOSIS — M545 Low back pain, unspecified: Secondary | ICD-10-CM | POA: Diagnosis not present

## 2013-10-22 DIAGNOSIS — Z79899 Other long term (current) drug therapy: Secondary | ICD-10-CM | POA: Diagnosis not present

## 2013-10-22 DIAGNOSIS — G894 Chronic pain syndrome: Secondary | ICD-10-CM | POA: Diagnosis not present

## 2013-10-23 DIAGNOSIS — M545 Low back pain, unspecified: Secondary | ICD-10-CM | POA: Diagnosis not present

## 2013-10-23 DIAGNOSIS — G894 Chronic pain syndrome: Secondary | ICD-10-CM | POA: Diagnosis not present

## 2013-10-23 DIAGNOSIS — Z79899 Other long term (current) drug therapy: Secondary | ICD-10-CM | POA: Diagnosis not present

## 2013-10-23 DIAGNOSIS — M25519 Pain in unspecified shoulder: Secondary | ICD-10-CM | POA: Diagnosis not present

## 2013-10-29 ENCOUNTER — Encounter (HOSPITAL_COMMUNITY): Payer: Self-pay | Admitting: Emergency Medicine

## 2013-10-29 ENCOUNTER — Observation Stay (HOSPITAL_COMMUNITY)
Admission: EM | Admit: 2013-10-29 | Discharge: 2013-10-31 | Disposition: A | Discharge status: Home / Self Care | Payer: Medicare Other | Attending: Family Medicine | Admitting: Family Medicine

## 2013-10-29 ENCOUNTER — Emergency Department (HOSPITAL_COMMUNITY): Payer: Medicare Other

## 2013-10-29 DIAGNOSIS — I10 Essential (primary) hypertension: Secondary | ICD-10-CM | POA: Diagnosis not present

## 2013-10-29 DIAGNOSIS — G3184 Mild cognitive impairment, so stated: Secondary | ICD-10-CM | POA: Diagnosis not present

## 2013-10-29 DIAGNOSIS — Z87820 Personal history of traumatic brain injury: Secondary | ICD-10-CM | POA: Diagnosis not present

## 2013-10-29 DIAGNOSIS — I651 Occlusion and stenosis of basilar artery: Secondary | ICD-10-CM

## 2013-10-29 DIAGNOSIS — F329 Major depressive disorder, single episode, unspecified: Secondary | ICD-10-CM | POA: Insufficient documentation

## 2013-10-29 DIAGNOSIS — K5900 Constipation, unspecified: Secondary | ICD-10-CM | POA: Diagnosis not present

## 2013-10-29 DIAGNOSIS — K429 Umbilical hernia without obstruction or gangrene: Secondary | ICD-10-CM

## 2013-10-29 DIAGNOSIS — Z79899 Other long term (current) drug therapy: Secondary | ICD-10-CM | POA: Diagnosis not present

## 2013-10-29 DIAGNOSIS — R0600 Dyspnea, unspecified: Secondary | ICD-10-CM

## 2013-10-29 DIAGNOSIS — S069X9S Unspecified intracranial injury with loss of consciousness of unspecified duration, sequela: Secondary | ICD-10-CM | POA: Insufficient documentation

## 2013-10-29 DIAGNOSIS — R079 Chest pain, unspecified: Principal | ICD-10-CM

## 2013-10-29 DIAGNOSIS — K219 Gastro-esophageal reflux disease without esophagitis: Secondary | ICD-10-CM | POA: Diagnosis not present

## 2013-10-29 DIAGNOSIS — E119 Type 2 diabetes mellitus without complications: Secondary | ICD-10-CM

## 2013-10-29 DIAGNOSIS — E785 Hyperlipidemia, unspecified: Secondary | ICD-10-CM | POA: Diagnosis not present

## 2013-10-29 DIAGNOSIS — M7918 Myalgia, other site: Secondary | ICD-10-CM

## 2013-10-29 DIAGNOSIS — R351 Nocturia: Secondary | ICD-10-CM | POA: Insufficient documentation

## 2013-10-29 DIAGNOSIS — S069X0S Unspecified intracranial injury without loss of consciousness, sequela: Secondary | ICD-10-CM

## 2013-10-29 DIAGNOSIS — J4489 Other specified chronic obstructive pulmonary disease: Secondary | ICD-10-CM | POA: Diagnosis not present

## 2013-10-29 DIAGNOSIS — G3189 Other specified degenerative diseases of nervous system: Secondary | ICD-10-CM

## 2013-10-29 DIAGNOSIS — F411 Generalized anxiety disorder: Secondary | ICD-10-CM | POA: Insufficient documentation

## 2013-10-29 DIAGNOSIS — R51 Headache: Secondary | ICD-10-CM | POA: Diagnosis not present

## 2013-10-29 DIAGNOSIS — G43909 Migraine, unspecified, not intractable, without status migrainosus: Secondary | ICD-10-CM | POA: Diagnosis not present

## 2013-10-29 DIAGNOSIS — Z794 Long term (current) use of insulin: Secondary | ICD-10-CM | POA: Diagnosis not present

## 2013-10-29 DIAGNOSIS — S069XAS Unspecified intracranial injury with loss of consciousness status unknown, sequela: Secondary | ICD-10-CM | POA: Diagnosis not present

## 2013-10-29 DIAGNOSIS — R109 Unspecified abdominal pain: Secondary | ICD-10-CM

## 2013-10-29 DIAGNOSIS — Z8673 Personal history of transient ischemic attack (TIA), and cerebral infarction without residual deficits: Secondary | ICD-10-CM

## 2013-10-29 DIAGNOSIS — H8110 Benign paroxysmal vertigo, unspecified ear: Secondary | ICD-10-CM

## 2013-10-29 DIAGNOSIS — Z8614 Personal history of Methicillin resistant Staphylococcus aureus infection: Secondary | ICD-10-CM | POA: Diagnosis not present

## 2013-10-29 DIAGNOSIS — R1310 Dysphagia, unspecified: Secondary | ICD-10-CM | POA: Insufficient documentation

## 2013-10-29 DIAGNOSIS — M48061 Spinal stenosis, lumbar region without neurogenic claudication: Secondary | ICD-10-CM | POA: Insufficient documentation

## 2013-10-29 DIAGNOSIS — R5383 Other fatigue: Secondary | ICD-10-CM

## 2013-10-29 DIAGNOSIS — F3289 Other specified depressive episodes: Secondary | ICD-10-CM | POA: Diagnosis not present

## 2013-10-29 DIAGNOSIS — F17200 Nicotine dependence, unspecified, uncomplicated: Secondary | ICD-10-CM | POA: Diagnosis not present

## 2013-10-29 DIAGNOSIS — F32A Depression, unspecified: Secondary | ICD-10-CM

## 2013-10-29 DIAGNOSIS — J449 Chronic obstructive pulmonary disease, unspecified: Secondary | ICD-10-CM | POA: Diagnosis not present

## 2013-10-29 DIAGNOSIS — F0781 Postconcussional syndrome: Secondary | ICD-10-CM

## 2013-10-29 DIAGNOSIS — Z5189 Encounter for other specified aftercare: Secondary | ICD-10-CM | POA: Diagnosis not present

## 2013-10-29 DIAGNOSIS — F419 Anxiety disorder, unspecified: Secondary | ICD-10-CM

## 2013-10-29 DIAGNOSIS — R42 Dizziness and giddiness: Secondary | ICD-10-CM

## 2013-10-29 DIAGNOSIS — F09 Unspecified mental disorder due to known physiological condition: Secondary | ICD-10-CM

## 2013-10-29 HISTORY — DX: Cerebral infarction, unspecified: I63.9

## 2013-10-29 LAB — CBC WITH DIFFERENTIAL/PLATELET
Basophils Absolute: 0 10*3/uL (ref 0.0–0.1)
Basophils Relative: 0 % (ref 0–1)
EOS ABS: 0.1 10*3/uL (ref 0.0–0.7)
Eosinophils Relative: 2 % (ref 0–5)
HEMATOCRIT: 39.7 % (ref 36.0–46.0)
Hemoglobin: 13.7 g/dL (ref 12.0–15.0)
LYMPHS PCT: 30 % (ref 12–46)
Lymphs Abs: 1.9 10*3/uL (ref 0.7–4.0)
MCH: 29.2 pg (ref 26.0–34.0)
MCHC: 34.5 g/dL (ref 30.0–36.0)
MCV: 84.6 fL (ref 78.0–100.0)
MONO ABS: 0.4 10*3/uL (ref 0.1–1.0)
Monocytes Relative: 7 % (ref 3–12)
Neutro Abs: 3.8 10*3/uL (ref 1.7–7.7)
Neutrophils Relative %: 61 % (ref 43–77)
Platelets: 164 10*3/uL (ref 150–400)
RBC: 4.69 MIL/uL (ref 3.87–5.11)
RDW: 13.6 % (ref 11.5–15.5)
WBC: 6.2 10*3/uL (ref 4.0–10.5)

## 2013-10-29 LAB — LIPASE, BLOOD: Lipase: 51 U/L (ref 11–59)

## 2013-10-29 LAB — BASIC METABOLIC PANEL
BUN: 12 mg/dL (ref 6–23)
CO2: 27 meq/L (ref 19–32)
CREATININE: 0.77 mg/dL (ref 0.50–1.10)
Calcium: 9.4 mg/dL (ref 8.4–10.5)
Chloride: 102 mEq/L (ref 96–112)
GFR calc Af Amer: >90 mL/min (ref >90)
GFR calc non Af Amer: >90 mL/min (ref >90)
Glucose, Bld: 137 mg/dL — ABNORMAL HIGH (ref 70–99)
Potassium: 3.5 mEq/L — ABNORMAL LOW (ref 3.7–5.3)
Sodium: 140 mEq/L (ref 137–147)

## 2013-10-29 LAB — TROPONIN I: Troponin I: <0.3 ng/mL (ref <0.30)

## 2013-10-29 MED ORDER — NITROGLYCERIN 0.4 MG SL SUBL
0.4000 mg | SUBLINGUAL_TABLET | SUBLINGUAL | Status: AC | PRN
  Administered 2013-10-29 (×3): 0.4 mg via SUBLINGUAL
  Filled 2013-10-29: qty 1

## 2013-10-29 MED ORDER — PANTOPRAZOLE SODIUM 40 MG IV SOLR
40.0000 mg | Freq: Once | INTRAVENOUS | Status: AC
  Administered 2013-10-29: 40 mg via INTRAVENOUS
  Filled 2013-10-29: qty 40

## 2013-10-29 MED ORDER — ASPIRIN 81 MG PO CHEW
324.0000 mg | CHEWABLE_TABLET | Freq: Once | ORAL | Status: AC
  Administered 2013-10-29: 324 mg via ORAL
  Filled 2013-10-29: qty 4

## 2013-10-29 NOTE — ED Provider Notes (Signed)
CSN: [id number]     Arrival date & time 10/29/13  2155 History  This chart was scribed for Sharyon Cable, MD by Marcha Dutton, ED Scribe. This patient was seen in room APA18/APA18 and the patient's care was started at 11:05 PM.    Chief Complaint  Patient presents with  . Chest Pain      Patient is a 58 y.o. female presenting with chest pain. The history is provided by the patient. No language interpreter was used.  Chest Pain Pain location:  Epigastric Pain quality: burning and sharp   Pain quality comment:  She states it feels like indegestion Pain radiates to:  L arm and upper back Pain severity:  Moderate Onset quality:  Gradual Duration:  1 day Timing:  Intermittent (Pt states pain last for a few minutes) Progression:  Worsening Chronicity:  New Context: not breathing   Relieved by:  Nothing Worsened by:  Movement and certain positions Ineffective treatments: ibuprofen. Associated symptoms: cough, headache, nausea, shortness of breath and vomiting   Associated symptoms: no abdominal pain   Risk factors: diabetes mellitus, hypertension and smoking   Pt denies h/o similar pain. She states she had a MI 10-12 years ago and that she was treated at [facility].   Past Medical History  Diagnosis Date  . Diabetes mellitus   . Hyperlipidemia   . Blood transfusion   . Arthritis   . History of back surgery     multiple  . Hypertension   . Hernia     incisional  . Wears glasses   . MRSA (methicillin resistant Staphylococcus aureus)   . Full dentures   . Chronic constipation   . Umbilical hernia   . Headache(784.0)   . Dizziness   . Concussion     HAS HAD DIZZINESS/ MEMORY PROBLEMS/ SLOW SPEACH /BALANCE PROBLEMS SINCE CONCUSSION 1 1/2 YR AGO PR GOES TO THERAPY  . Fibromyalgia   . Scar   . GERD (gastroesophageal reflux disease)   . Chronic stomach ulcer   . Nocturia   . Sleep difficulties   . Depression   . Anxiety   . Brain injury     1 1/2 YR AGO  .  Concussion   . Diabetes mellitus   . Paroxysmal vertigo     Past Surgical History  Procedure Laterality Date  . Cholecystectomy  1984  . Spinal fusion  2006  . Eye surgery  2010    bilateral  . Abdominal hysterectomy    . Cerebral aneurysm repair    . Back surgery      SPINAL FUSION X6   . Incisional hernia repair  05/19/2011    Procedure: HERNIA REPAIR INCISIONAL;  Surgeon: Willey Blade, MD;  Location: [facility];  Service: General;  Laterality: N/A;  repair incisional hernia with mesh  . Hernia repair      09/21/2010  . Hernia repair  05/19/11    incisional hernia repair   . Appendectomy    . Tubal ligation      Family History  Problem Relation Age of Onset  . Breast cancer Mother   . Allergies Daughter   . Heart disease Father     History  Substance Use Topics  . Smoking status: Current Every Day Smoker -- 1.00 packs/day for 38 years    Types: Cigarettes  . Smokeless tobacco: Never Used  . Alcohol Use: No    OB History   Grav Para Term Preterm Abortions TAB SAB  Ect Mult Living                  Review of Systems  Respiratory: Positive for cough and shortness of breath.   Cardiovascular: Positive for chest pain.  Gastrointestinal: Positive for nausea and vomiting. Negative for abdominal pain and blood in stool.  Neurological: Positive for headaches.  All other systems reviewed and are negative.     Allergies  Cymbalta and Latex  Home Medications   Prior to Admission medications   Medication Sig Start Date End Date Taking? Authorizing Provider  ALPRAZolam Duanne Moron) 1 MG tablet Take 1 mg by mouth 3 (three) times daily.  10/01/13  Yes Historical Provider, MD  atorvastatin (LIPITOR) 40 MG tablet Take 40 mg by mouth daily.   Yes Historical Provider, MD  desvenlafaxine (PRISTIQ) 100 MG 24 hr tablet Take 100 mg by mouth every morning.    Yes Historical Provider, MD  esomeprazole (NEXIUM) 40 MG capsule Take 40 mg by mouth daily.    Yes Historical Provider,  MD  estradiol (ESTRACE) 1 MG tablet Take 1 mg by mouth daily.   Yes Historical Provider, MD  fesoterodine (TOVIAZ) 4 MG TB24 tablet Take 4 mg by mouth daily.   Yes Historical Provider, MD  gabapentin (NEURONTIN) 600 MG tablet Take 600 mg by mouth 3 (three) times daily.   Yes Historical Provider, MD  ibuprofen (ADVIL,MOTRIN) 800 MG tablet Take 800 mg by mouth every 8 (eight) hours as needed.   Yes Historical Provider, MD  imipramine (TOFRANIL) 50 MG tablet Take 50 mg by mouth at bedtime.    Yes Historical Provider, MD  insulin glargine (LANTUS) 100 UNIT/ML injection Inject 28 Units into the skin at bedtime.   Yes Historical Provider, MD  insulin lispro (HUMALOG) 100 UNIT/ML injection Inject 1-4 Units into the skin 3 (three) times daily before meals. Sliding scale   Yes Historical Provider, MD  lisinopril (PRINIVIL,ZESTRIL) 20 MG tablet Take 20 mg by mouth daily.  10/02/13  Yes Historical Provider, MD  lubiprostone (AMITIZA) 24 MCG capsule Take 24 mcg by mouth 2 (two) times daily with a meal.   Yes Historical Provider, MD  methocarbamol (ROBAXIN) 500 MG tablet Take 500 mg by mouth 3 (three) times daily as needed for muscle spasms.    Yes Historical Provider, MD  methylphenidate (RITALIN) 20 MG tablet Take 1 tablet (20 mg total) by mouth 2 (two) times daily. 10/17/13  Yes Meredith Staggers, MD  minocycline (DYNACIN) 100 MG tablet Take 100 mg by mouth 2 (two) times daily.   Yes Historical Provider, MD  NEXIUM 40 MG capsule Take 40 mg by mouth daily. 08/30/13  Yes Historical Provider, MD  olmesartan (BENICAR) 20 MG tablet Take 10 mg by mouth daily. One half daily 09/21/13  Yes Tanda Rockers, MD  oxyCODONE-acetaminophen (PERCOCET) 10-325 MG per tablet Take 1 tablet by mouth every 4 (four) hours as needed for pain.   Yes Historical Provider, MD  Oxymorphone ER, Crush Resist, (OPANA ER, CRUSH RESISTANT,) 15 MG T12A Take 15 mg by mouth every 12 (twelve) hours. 10/17/13  Yes Meredith Staggers, MD  promethazine  (PHENERGAN) 25 MG tablet Take 25 mg by mouth every 6 (six) hours as needed for nausea.   Yes Historical Provider, MD  RESTASIS 0.05 % ophthalmic emulsion Place 1 drop into both eyes 2 (two) times daily.  10/08/13  Yes Historical Provider, MD  rivastigmine (EXELON) 3 MG capsule Take 1 capsule (3 mg total) by mouth 2 (  two) times daily. 10/17/13  Yes Meredith Staggers, MD  topiramate (TOPAMAX) 100 MG tablet Take 1.5 tablets (150 mg total) by mouth daily. 08/31/13  Yes Meredith Staggers, MD  torsemide (DEMADEX) 20 MG tablet Take 20 mg by mouth every morning.    Yes Historical Provider, MD  tretinoin (RETIN-A) 0.1 % cream Apply 1 application topically at bedtime.   Yes Historical Provider, MD  zolpidem (AMBIEN CR) 12.5 MG CR tablet Take 12.5 mg by mouth at bedtime as needed for sleep.   Yes Historical Provider, MD  halobetasol (ULTRAVATE) 0.05 % cream Apply 1 application topically 2 (two) times daily.    Historical Provider, MD  rizatriptan (MAXALT) 10 MG tablet Take 10 mg by mouth as needed for migraine. May repeat in 2 hours if needed    Historical Provider, MD  scopolamine (TRANSDERM-SCOP) 1 MG/3DAYS Place 1 patch (1.5 mg total) onto the skin every 3 (three) days. 10/17/13   Meredith Staggers, MD   Triage Vitals: BP 171/92  Pulse 81  Temp(Src) 97.9 F (36.6 C) (Oral)  Resp 19  Ht 5\' 4"  (1.626 m)  Wt 154 lb (69.854 kg)  BMI 26.42 kg/m2  SpO2 100%   Physical Exam  Nursing note and vitals reviewed. CONSTITUTIONAL: Well developed/well nourished HEAD: Normocephalic/atraumatic EYES: EOMI/PERRL ENMT: Mucous membranes moist NECK: supple no meningeal signs SPINE:entire spine nontender CV: S1/S2 noted, no murmurs/rubs/gallops noted LUNGS: Lungs are clear to auscultation bilaterally, no apparent distress ABDOMEN: soft, nontender, no rebound or guarding GU:no cva tenderness NEURO: Pt is awake/alert, moves all extremitiesx4 EXTREMITIES: pulses normal, full ROM SKIN: warm, color normal PSYCH: no  abnormalities of mood noted   ED Course  Procedures (including critical care time)  DIAGNOSTIC STUDIES: Oxygen Saturation is 100% on RA, normal by my interpretation.    COORDINATION OF CARE: 11:13 PM- Pt advised of plan for treatment and pt agrees.  1:04 AM Pt with complete relief of pain  given risk factors for ACS, and also response to NTG, will admit for CP evaluation Pt clinically stable at this time D/w dr Maudie Mercury with [facility] will admit  suspicion for Dissection/PE is low Pt reports h/o MI but no recent cardiac evaluations noted  Labs Review Labs Reviewed  BASIC METABOLIC PANEL - Abnormal; Notable for the following:    Potassium 3.5 (*)    Glucose, Bld 137 (*)    All other components within normal limits  CBC WITH DIFFERENTIAL  TROPONIN I  LIPASE, BLOOD  TROPONIN I  TROPONIN I  TROPONIN I  LIPID PANEL    Imaging Review Dg Chest Portable 1 View  10/29/2013   CLINICAL DATA:  Chest pain.  EXAM: PORTABLE CHEST - 1 VIEW  COMPARISON:  09/21/2013  FINDINGS: Single view of the chest demonstrates clear lungs. Prominent lung markings are not significantly changed. No evidence for pulmonary edema. Heart size is normal. No focal airspace disease.  IMPRESSION: No acute cardiopulmonary disease.   Electronically Signed   By: Markus Daft M.D.   On: 10/29/2013 23:08     Date: 10/29/2013  Rate: 79  Rhythm: normal sinus rhythm  QRS Axis: normal  Intervals: normal  ST/T Wave abnormalities: nonspecific ST changes  Conduction Disutrbances:none  Narrative Interpretation:  Artifact noted  Old EKG Reviewed: unchanged     MDM   Final diagnoses:  Chest pain    Nursing notes including past medical history and social history reviewed and considered in documentation xrays reviewed and considered Previous records reviewed and considered  Labs/vital reviewed and considered;a   I personally performed the services described in this documentation, which was scribed in my presence. The  recorded information has been reviewed and is accurate.    Sharyon Cable, MD 10/30/13 [phone]

## 2013-10-29 NOTE — ED Notes (Signed)
Patient complaining of epigastric pain radiating into chest and left arm since yesterday.

## 2013-10-30 ENCOUNTER — Encounter (HOSPITAL_COMMUNITY): Payer: Self-pay | Admitting: Internal Medicine

## 2013-10-30 DIAGNOSIS — I10 Essential (primary) hypertension: Secondary | ICD-10-CM

## 2013-10-30 DIAGNOSIS — R079 Chest pain, unspecified: Secondary | ICD-10-CM | POA: Diagnosis not present

## 2013-10-30 DIAGNOSIS — E785 Hyperlipidemia, unspecified: Secondary | ICD-10-CM

## 2013-10-30 DIAGNOSIS — E119 Type 2 diabetes mellitus without complications: Secondary | ICD-10-CM

## 2013-10-30 LAB — TROPONIN I: Troponin I: <0.3 ng/mL (ref <0.30)

## 2013-10-30 LAB — GLUCOSE, CAPILLARY
GLUCOSE-CAPILLARY: 171 mg/dL — AB (ref 70–99)
Glucose-Capillary: 130 mg/dL — ABNORMAL HIGH (ref 70–99)
Glucose-Capillary: 128 mg/dL — ABNORMAL HIGH (ref 70–99)
Glucose-Capillary: 245 mg/dL — ABNORMAL HIGH (ref 70–99)
Glucose-Capillary: 57 mg/dL — ABNORMAL LOW (ref 70–99)
Glucose-Capillary: 148 mg/dL — ABNORMAL HIGH (ref 70–99)

## 2013-10-30 LAB — LIPID PANEL
Cholesterol: 184 mg/dL (ref 0–200)
HDL: 40 mg/dL (ref >39)
LDL Cholesterol: 95 mg/dL (ref 0–99)
TRIGLYCERIDES: 244 mg/dL — AB (ref <150)
Total CHOL/HDL Ratio: 4.6 RATIO
VLDL: 49 mg/dL — AB (ref 0–40)

## 2013-10-30 LAB — MRSA PCR SCREENING: MRSA by PCR: NEGATIVE

## 2013-10-30 MED ORDER — SODIUM CHLORIDE 0.9 % IJ SOLN: 3.0000 mL | INTRAMUSCULAR | Status: DC | PRN

## 2013-10-30 MED ORDER — FESOTERODINE FUMARATE ER 4 MG PO TB24
4.0000 mg | ORAL_TABLET | Freq: Every day | ORAL | Status: DC
  Administered 2013-10-30 – 2013-10-31 (×2): 4 mg via ORAL
  Filled 2013-10-30 (×4): qty 1

## 2013-10-30 MED ORDER — CLOBETASOL PROPIONATE 0.05 % EX OINT
TOPICAL_OINTMENT | Freq: Two times a day (BID) | CUTANEOUS | Status: DC
  Administered 2013-10-31: 1 via TOPICAL
  Filled 2013-10-30: qty 15

## 2013-10-30 MED ORDER — ZOLPIDEM TARTRATE 5 MG PO TABS: 5.0000 mg | ORAL_TABLET | Freq: Every evening | ORAL | Status: DC | PRN

## 2013-10-30 MED ORDER — NICOTINE 14 MG/24HR TD PT24
14.0000 mg | MEDICATED_PATCH | Freq: Every day | TRANSDERMAL | Status: DC
  Administered 2013-10-30 – 2013-10-31 (×2): 14 mg via TRANSDERMAL
  Filled 2013-10-30 (×2): qty 1

## 2013-10-30 MED ORDER — GABAPENTIN 300 MG PO CAPS
600.0000 mg | ORAL_CAPSULE | Freq: Three times a day (TID) | ORAL | Status: DC
  Administered 2013-10-30 – 2013-10-31 (×4): 600 mg via ORAL
  Filled 2013-10-30 (×9): qty 2

## 2013-10-30 MED ORDER — PANTOPRAZOLE SODIUM 40 MG PO TBEC
40.0000 mg | DELAYED_RELEASE_TABLET | Freq: Every day | ORAL | Status: DC
  Administered 2013-10-30 – 2013-10-31 (×2): 40 mg via ORAL
  Filled 2013-10-30 (×2): qty 1

## 2013-10-30 MED ORDER — METHOCARBAMOL 500 MG PO TABS: 500.0000 mg | ORAL_TABLET | Freq: Three times a day (TID) | ORAL | Status: DC | PRN

## 2013-10-30 MED ORDER — SODIUM CHLORIDE 0.9 % IJ SOLN: 3.0000 mL | Freq: Two times a day (BID) | INTRAMUSCULAR | Status: DC

## 2013-10-30 MED ORDER — IRBESARTAN 150 MG PO TABS
150.0000 mg | ORAL_TABLET | Freq: Every day | ORAL | Status: DC
  Administered 2013-10-30 – 2013-10-31 (×2): 150 mg via ORAL
  Filled 2013-10-30 (×4): qty 1

## 2013-10-30 MED ORDER — ASPIRIN EC 325 MG PO TBEC
325.0000 mg | DELAYED_RELEASE_TABLET | Freq: Every day | ORAL | Status: DC
  Administered 2013-10-30 – 2013-10-31 (×2): 325 mg via ORAL
  Filled 2013-10-30 (×2): qty 1

## 2013-10-30 MED ORDER — ATORVASTATIN CALCIUM 40 MG PO TABS
40.0000 mg | ORAL_TABLET | Freq: Every day | ORAL | Status: DC
  Administered 2013-10-30: 40 mg via ORAL
  Filled 2013-10-30 (×2): qty 1

## 2013-10-30 MED ORDER — ALPRAZOLAM 1 MG PO TABS
1.0000 mg | ORAL_TABLET | Freq: Three times a day (TID) | ORAL | Status: DC
  Administered 2013-10-30 – 2013-10-31 (×4): 1 mg via ORAL
  Filled 2013-10-30 (×4): qty 1

## 2013-10-30 MED ORDER — SODIUM CHLORIDE 0.9 % IJ SOLN
3.0000 mL | Freq: Two times a day (BID) | INTRAMUSCULAR | Status: DC
  Administered 2013-10-30: 02:00:00 via INTRAVENOUS

## 2013-10-30 MED ORDER — ZOLPIDEM TARTRATE 5 MG PO TABS
10.0000 mg | ORAL_TABLET | Freq: Every evening | ORAL | Status: DC | PRN
  Administered 2013-10-30: 10 mg via ORAL
  Filled 2013-10-30: qty 2

## 2013-10-30 MED ORDER — NITROGLYCERIN 2 % TD OINT
0.5000 [in_us] | TOPICAL_OINTMENT | Freq: Three times a day (TID) | TRANSDERMAL | Status: DC
  Administered 2013-10-30 – 2013-10-31 (×5): 0.5 [in_us] via TOPICAL
  Filled 2013-10-30 (×5): qty 1

## 2013-10-30 MED ORDER — METHYLPHENIDATE HCL 5 MG PO TABS
20.0000 mg | ORAL_TABLET | Freq: Two times a day (BID) | ORAL | Status: DC
  Administered 2013-10-30 – 2013-10-31 (×3): 20 mg via ORAL
  Filled 2013-10-30 (×3): qty 4

## 2013-10-30 MED ORDER — ENOXAPARIN SODIUM 40 MG/0.4ML ~~LOC~~ SOLN
40.0000 mg | SUBCUTANEOUS | Status: DC
  Administered 2013-10-30: 40 mg via SUBCUTANEOUS
  Filled 2013-10-30: qty 0.4

## 2013-10-30 MED ORDER — RIVASTIGMINE TARTRATE 3 MG PO CAPS
3.0000 mg | ORAL_CAPSULE | Freq: Two times a day (BID) | ORAL | Status: DC
  Administered 2013-10-30 – 2013-10-31 (×3): 3 mg via ORAL
  Filled 2013-10-30 (×8): qty 1

## 2013-10-30 MED ORDER — TRETINOIN 0.1 % EX CREA: 1.0000 "application " | TOPICAL_CREAM | Freq: Every day | CUTANEOUS | Status: DC

## 2013-10-30 MED ORDER — TOPIRAMATE 25 MG PO TABS
150.0000 mg | ORAL_TABLET | Freq: Every day | ORAL | Status: DC
  Administered 2013-10-30 – 2013-10-31 (×2): 150 mg via ORAL
  Filled 2013-10-30 (×6): qty 2

## 2013-10-30 MED ORDER — MORPHINE SULFATE 2 MG/ML IJ SOLN
1.0000 mg | INTRAMUSCULAR | Status: DC | PRN
  Administered 2013-10-30 – 2013-10-31 (×3): 1 mg via INTRAVENOUS
  Filled 2013-10-30 (×3): qty 1

## 2013-10-30 MED ORDER — LUBIPROSTONE 24 MCG PO CAPS
24.0000 ug | ORAL_CAPSULE | Freq: Two times a day (BID) | ORAL | Status: DC
  Administered 2013-10-30 – 2013-10-31 (×3): 24 ug via ORAL
  Filled 2013-10-30 (×7): qty 1

## 2013-10-30 MED ORDER — SCOPOLAMINE 1 MG/3DAYS TD PT72
1.0000 | MEDICATED_PATCH | TRANSDERMAL | Status: DC
  Filled 2013-10-30 (×2): qty 1

## 2013-10-30 MED ORDER — MINOCYCLINE HCL 100 MG PO TABS
100.0000 mg | ORAL_TABLET | Freq: Two times a day (BID) | ORAL | Status: DC
  Administered 2013-10-30 – 2013-10-31 (×3): 100 mg via ORAL
  Filled 2013-10-30 (×7): qty 1

## 2013-10-30 MED ORDER — OXYCODONE HCL 5 MG PO TABS: 5.0000 mg | ORAL_TABLET | ORAL | Status: DC | PRN

## 2013-10-30 MED ORDER — IMIPRAMINE HCL 25 MG PO TABS
50.0000 mg | ORAL_TABLET | Freq: Every day | ORAL | Status: DC
  Administered 2013-10-30: 50 mg via ORAL
  Filled 2013-10-30: qty 1
  Filled 2013-10-30: qty 2
  Filled 2013-10-30: qty 1

## 2013-10-30 MED ORDER — ACETAMINOPHEN 325 MG PO TABS: 650.0000 mg | ORAL_TABLET | Freq: Four times a day (QID) | ORAL | Status: DC | PRN

## 2013-10-30 MED ORDER — HALOBETASOL PROPIONATE 0.05 % EX CREA: 1.0000 "application " | TOPICAL_CREAM | Freq: Two times a day (BID) | CUTANEOUS | Status: DC

## 2013-10-30 MED ORDER — SODIUM CHLORIDE 0.9 % IV SOLN: 250.0000 mL | INTRAVENOUS | Status: DC | PRN

## 2013-10-30 MED ORDER — PROMETHAZINE HCL 12.5 MG PO TABS
25.0000 mg | ORAL_TABLET | Freq: Four times a day (QID) | ORAL | Status: DC | PRN
  Administered 2013-10-30 (×2): 25 mg via ORAL
  Filled 2013-10-30 (×2): qty 2

## 2013-10-30 MED ORDER — VENLAFAXINE HCL ER 75 MG PO CP24
150.0000 mg | ORAL_CAPSULE | Freq: Every morning | ORAL | Status: DC
  Administered 2013-10-30 – 2013-10-31 (×2): 150 mg via ORAL
  Filled 2013-10-30: qty 2
  Filled 2013-10-30 (×2): qty 1
  Filled 2013-10-30: qty 2

## 2013-10-30 MED ORDER — SODIUM CHLORIDE 0.9 % IV SOLN
INTRAVENOUS | Status: DC
  Administered 2013-10-30 (×2): via INTRAVENOUS

## 2013-10-30 MED ORDER — TORSEMIDE 20 MG PO TABS
20.0000 mg | ORAL_TABLET | Freq: Every day | ORAL | Status: DC
  Administered 2013-10-30 – 2013-10-31 (×2): 20 mg via ORAL
  Filled 2013-10-30 (×4): qty 1

## 2013-10-30 MED ORDER — CYCLOSPORINE 0.05 % OP EMUL
1.0000 [drp] | Freq: Two times a day (BID) | OPHTHALMIC | Status: DC
  Administered 2013-10-30: 1 [drp] via OPHTHALMIC
  Administered 2013-10-30: 22:00:00 via OPHTHALMIC
  Administered 2013-10-31: 1 [drp] via OPHTHALMIC
  Filled 2013-10-30 (×8): qty 1

## 2013-10-30 MED ORDER — ACETAMINOPHEN 650 MG RE SUPP: 650.0000 mg | Freq: Four times a day (QID) | RECTAL | Status: DC | PRN

## 2013-10-30 MED ORDER — OXYCODONE-ACETAMINOPHEN 10-325 MG PO TABS: 1.0000 | ORAL_TABLET | ORAL | Status: DC | PRN

## 2013-10-30 MED ORDER — OXYCODONE-ACETAMINOPHEN 5-325 MG PO TABS: 1.0000 | ORAL_TABLET | ORAL | Status: DC | PRN

## 2013-10-30 MED ORDER — MORPHINE SULFATE ER 30 MG PO TBCR
30.0000 mg | EXTENDED_RELEASE_TABLET | Freq: Two times a day (BID) | ORAL | Status: DC
  Administered 2013-10-30 – 2013-10-31 (×3): 30 mg via ORAL
  Filled 2013-10-30 (×3): qty 1

## 2013-10-30 MED ORDER — ESTRADIOL 1 MG PO TABS
1.0000 mg | ORAL_TABLET | Freq: Every day | ORAL | Status: DC
  Administered 2013-10-30 – 2013-10-31 (×2): 1 mg via ORAL
  Filled 2013-10-30 (×4): qty 1

## 2013-10-30 MED ORDER — INSULIN ASPART 100 UNIT/ML ~~LOC~~ SOLN
0.0000 [IU] | SUBCUTANEOUS | Status: DC
  Administered 2013-10-30: 2 [IU] via SUBCUTANEOUS
  Administered 2013-10-30 – 2013-10-31 (×2): 3 [IU] via SUBCUTANEOUS
  Administered 2013-10-31: 2 [IU] via SUBCUTANEOUS
  Administered 2013-10-31: 1 [IU] via SUBCUTANEOUS

## 2013-10-30 MED FILL — Minocycline HCl Cap 100 MG: ORAL | Qty: 1 | Status: AC

## 2013-10-30 NOTE — H&P (Signed)
Mallory Mendez is an 58 y.o. female.    Pcp: Shirline Frees [place], [place])  Chief Complaint: chest pain HPI: 58  Yo female with dm2, hypertension, hyperlipidemia c/o chest pain starting about last nite around 7pm.  At rest while she was sitting at a table.  Pain was burning in the substernal chest.  Like real bad heartburn.  Radiation to the left arm. nohting appeared to make the pain better or worse.  + subjective fever, dry cough, dyspnea, and slight nausea.  Pt presented to the ED for evaluation.   Initial cardiac marker negative.  Ekg:  nsr _0 , nl axis, no st t changes consistent with ischemia.  CXR negative. Pt will be admitted for w/up cp   Past Medical History  Diagnosis Date  . Diabetes mellitus   . Hyperlipidemia   . Blood transfusion   . Arthritis   . History of back surgery     multiple  . Hypertension   . Hernia     incisional  . Wears glasses   . MRSA (methicillin resistant Staphylococcus aureus)   . Full dentures   . Chronic constipation   . Umbilical hernia   . Headache(784.0)   . Dizziness   . Concussion     HAS HAD DIZZINESS/ MEMORY PROBLEMS/ SLOW SPEACH /BALANCE PROBLEMS SINCE CONCUSSION 1 1/2 YR AGO PR GOES TO THERAPY  . Fibromyalgia   . Scar   . GERD (gastroesophageal reflux disease)   . Chronic stomach ulcer   . Nocturia   . Sleep difficulties   . Depression   . Anxiety   . Brain injury     1 1/2 YR AGO  . Concussion   . Diabetes mellitus   . Paroxysmal vertigo   . Stroke     crica 2002    Past Surgical History  Procedure Laterality Date  . Cholecystectomy  1984  . Spinal fusion  2006  . Eye surgery  2010    bilateral  . Abdominal hysterectomy    . Cerebral aneurysm repair    . Back surgery      SPINAL FUSION X6   . Incisional hernia repair  05/19/2011    Procedure: HERNIA REPAIR INCISIONAL;  Surgeon: Willey Blade, MD;  Location: [facility];  Service: General;  Laterality: N/A;  repair incisional hernia with mesh  . Hernia repair       09/21/2010  . Hernia repair  05/19/11    incisional hernia repair   . Appendectomy    . Tubal ligation      Family History  Problem Relation Age of Onset  . Breast cancer Mother   . Allergies Daughter   . Heart disease Father    Social History:  reports that she has been smoking Cigarettes.  She has a 38 pack-year smoking history. She has never used smokeless tobacco. She reports that she does not drink alcohol or use illicit drugs.  Allergies:  Allergies  Allergen Reactions  . Cymbalta [Duloxetine Hcl] Anaphylaxis  . Latex Anaphylaxis     (Not in a hospital admission)  Results for orders placed during the hospital encounter of 10/29/13 (from the past 48 hour(s))  CBC WITH DIFFERENTIAL     Status: None   Collection Time    10/29/13 10:39 PM      Result Value Ref Range   WBC 6.2  4.0 - 10.5 K/uL   RBC 4.69  3.87 - 5.11 MIL/uL   Hemoglobin 13.7  12.0 - 15.0  g/dL   HCT 39.7  36.0 - 46.0 %   MCV 84.6  78.0 - 100.0 fL   MCH 29.2  26.0 - 34.0 pg   MCHC 34.5  30.0 - 36.0 g/dL   RDW 13.6  11.5 - 15.5 %   Platelets 164  150 - 400 K/uL   Neutrophils Relative % 61  43 - 77 %   Neutro Abs 3.8  1.7 - 7.7 K/uL   Lymphocytes Relative 30  12 - 46 %   Lymphs Abs 1.9  0.7 - 4.0 K/uL   Monocytes Relative 7  3 - 12 %   Monocytes Absolute 0.4  0.1 - 1.0 K/uL   Eosinophils Relative 2  0 - 5 %   Eosinophils Absolute 0.1  0.0 - 0.7 K/uL   Basophils Relative 0  0 - 1 %   Basophils Absolute 0.0  0.0 - 0.1 K/uL  BASIC METABOLIC PANEL     Status: Abnormal   Collection Time    10/29/13 10:39 PM      Result Value Ref Range   Sodium 140  137 - 147 mEq/L   Potassium 3.5 (*) 3.7 - 5.3 mEq/L   Chloride 102  96 - 112 mEq/L   CO2 27  19 - 32 mEq/L   Glucose, Bld 137 (*) 70 - 99 mg/dL   BUN 12  6 - 23 mg/dL   Creatinine, Ser 0.77  0.50 - 1.10 mg/dL   Calcium 9.4  8.4 - 10.5 mg/dL   GFR calc non Af Amer >90  >90 mL/min   GFR calc Af Amer >90  >90 mL/min   Comment: (NOTE)     The eGFR  has been calculated using the CKD EPI equation.     This calculation has not been validated in all clinical situations.     eGFR's persistently <90 mL/min signify possible Chronic Kidney     Disease.  TROPONIN I     Status: None   Collection Time    10/29/13 10:39 PM      Result Value Ref Range   Troponin I <0.30  <0.30 ng/mL   Comment:            Due to the release kinetics of cTnI,     a negative result within the first hours     of the onset of symptoms does not rule out     myocardial infarction with certainty.     If myocardial infarction is still suspected,     repeat the test at appropriate intervals.  LIPASE, BLOOD     Status: None   Collection Time    10/29/13 11:04 PM      Result Value Ref Range   Lipase 51  11 - 59 U/L   Dg Chest Portable 1 View  10/29/2013   CLINICAL DATA:  Chest pain.  EXAM: PORTABLE CHEST - 1 VIEW  COMPARISON:  09/21/2013  FINDINGS: Single view of the chest demonstrates clear lungs. Prominent lung markings are not significantly changed. No evidence for pulmonary edema. Heart size is normal. No focal airspace disease.  IMPRESSION: No acute cardiopulmonary disease.   Electronically Signed   By: Markus Daft M.D.   On: 10/29/2013 23:08    ROS negative for all 10 organ systems except for + above  Blood pressure 133/75, pulse 73, temperature 97.9 F (36.6 C), temperature source Oral, resp. rate 24, height 5' 4" (1.626 m), weight 154 lb (69.854 kg), SpO2 97.00%. Physical Exam  Heent: anicteric Neck: no jvd Heart: rrr s1, s2 Lung: ctab Abd: soft, obese, nt, nd, +bs Ext: no c/c/e Skin: no rash Lymph: no adenopathy Neuro: nonfocal  Assessment/Plan Chest pain Tele Cycle cardiac markers Npo after mn Stress testing in am Chest pain sounds atypical possibly related to gerd  Hypertension: cont present medications  Hyperlipidemia:  Cont lipitor  Dm2: hold lantus since npo,  Iss   Gerd:  Nexium=> protonix    Jani Gravel 10/30/2013, 1:17  AM

## 2013-10-30 NOTE — Progress Notes (Signed)
Patient complained of sharp sudden episode of chest pain to the middle of her chest.  Paged hospitalist.  VS stable, placed patient on 2lpm via [place] and will get EKG.  Patient states the pain has resolved after less than a minute.

## 2013-10-30 NOTE — Consult Note (Addendum)
CARDIOLOGY CONSULT NOTE  Patient ID: Mallory Mendez MRN: [id number] DOB/AGE: April 16, 1956 58 y.o.  Admit date: 10/29/2013 Primary Physician Shirline Frees, MD  Reason for Consultation: chest pain  HPI: The patient is a 58 yr old woman with a PMH significant for IDDM, HTN, hyperlipidemia, basilar artery aneurysm s/p coiling, tobacco abuse, multiple CVA's (as per pt) who sees a cardiologist in [place], Dr. Adrian Prows.  Yesterday, while standing at the sink to do the dishes, she began experiencing substernal "chest burning" which lasted several minutes. The pain eventually radiated down her left arm to the fingertips. She began to feel fatigued and sat down and the pain subsided. However, when she got up to walk around her house, she began to feel fatigued again and then decided to present to the ED. She has become progressively fatigued over the last two and a half months. Upon further questioning, she admits to exertional dyspnea (sees Dr. Melvyn Novas, pulmonologist) as well as intermittent, albeit seldom, palpitations. She thinks her left ankle has been more swollen from time to time. She has felt nauseous but denies abdominal pain and vomiting. She says she has a history of stomach ulcers dating back to the early 90's. She also admits to memory loss after strokes and a concussion.  Since admission, she has ruled out for an acute coronary syndrome with normal troponins. K slightly low at 3.5.  Chest xray was unremarkable. ECG showed normal sinus rhythm with no diagnostic ST-T abnormalities.  Soc: Smokes 1 ppd x several years.   Allergies  Allergen Reactions  . Cymbalta [Duloxetine Hcl] Anaphylaxis  . Latex Anaphylaxis    Current Facility-Administered Medications  Medication Dose Route Frequency Provider Last Rate Last Dose  . 0.9 %  sodium chloride infusion  250 mL Intravenous PRN Jani Gravel, MD      . 0.9 %  sodium chloride infusion   Intravenous Continuous Jani Gravel, MD 75 mL/hr  at 10/30/13 0147    . acetaminophen (TYLENOL) tablet 650 mg  650 mg Oral Q6H PRN Jani Gravel, MD       Or  . acetaminophen (TYLENOL) suppository 650 mg  650 mg Rectal Q6H PRN Jani Gravel, MD      . ALPRAZolam Duanne Moron) tablet 1 mg  1 mg Oral TID Jani Gravel, MD   1 mg at 10/30/13 0944  . aspirin EC tablet 325 mg  325 mg Oral Daily Jani Gravel, MD   325 mg at 10/30/13 1002  . atorvastatin (LIPITOR) tablet 40 mg  40 mg Oral QPC supper Jani Gravel, MD      . clobetasol ointment (TEMOVATE) 0.05 %   Topical BID Nita Sells, MD      . cycloSPORINE (RESTASIS) 0.05 % ophthalmic emulsion 1 drop  1 drop Both Eyes BID Jani Gravel, MD   1 drop at 10/30/13 0941  . enoxaparin (LOVENOX) injection 40 mg  40 mg Subcutaneous Q24H Jani Gravel, MD      . estradiol (ESTRACE) tablet 1 mg  1 mg Oral Daily Jani Gravel, MD   1 mg at 10/30/13 0941  . fesoterodine (TOVIAZ) tablet 4 mg  4 mg Oral Daily Jani Gravel, MD   4 mg at 10/30/13 0941  . gabapentin (NEURONTIN) capsule 600 mg  600 mg Oral TID Jani Gravel, MD   600 mg at 10/30/13 0943  . imipramine (TOFRANIL) tablet 50 mg  50 mg Oral QHS Jani Gravel, MD      . insulin aspart (  novoLOG) injection 0-9 Units  0-9 Units Subcutaneous 6 times per day Jani Gravel, MD      . irbesartan Levy Sjogren) tablet 150 mg  150 mg Oral Daily Jani Gravel, MD   150 mg at 10/30/13 1002  . lubiprostone (AMITIZA) capsule 24 mcg  24 mcg Oral BID WC Jani Gravel, MD   24 mcg at 10/30/13 0941  . methocarbamol (ROBAXIN) tablet 500 mg  500 mg Oral TID PRN Jani Gravel, MD      . methylphenidate (RITALIN) tablet 20 mg  20 mg Oral BID Jani Gravel, MD   20 mg at 10/30/13 0943  . minocycline (DYNACIN) tablet 100 mg  100 mg Oral BID Jani Gravel, MD   100 mg at 10/30/13 1004  . morphine (MS CONTIN) 12 hr tablet 30 mg  30 mg Oral Q12H Jani Gravel, MD   30 mg at 10/30/13 0943  . morphine 2 MG/ML injection 1 mg  1 mg Intravenous Q4H PRN Jani Gravel, MD   1 mg at 10/30/13 0747  . nicotine (NICODERM CQ - dosed in mg/24 hours) patch 14 mg  14 mg  Transdermal Daily Jani Gravel, MD   14 mg at 10/30/13 0942  . nitroGLYCERIN (NITROGLYN) 2 % ointment 0.5 inch  0.5 inch Topical 3 times per day Jani Gravel, MD   0.5 inch at 10/30/13 0600  . oxyCODONE-acetaminophen (PERCOCET/ROXICET) 5-325 MG per tablet 1 tablet  1 tablet Oral Q4H PRN Jani Gravel, MD       And  . oxyCODONE (Oxy IR/ROXICODONE) immediate release tablet 5 mg  5 mg Oral Q4H PRN Jani Gravel, MD      . pantoprazole (PROTONIX) EC tablet 40 mg  40 mg Oral Daily Jani Gravel, MD   40 mg at 10/30/13 1001  . promethazine (PHENERGAN) tablet 25 mg  25 mg Oral Q6H PRN Jani Gravel, MD   25 mg at 10/30/13 1003  . rivastigmine (EXELON) capsule 3 mg  3 mg Oral BID Jani Gravel, MD   3 mg at 10/30/13 [phone]  . scopolamine (TRANSDERM-SCOP) 1 MG/3DAYS 1.5 mg  1 patch Transdermal Q72H Jani Gravel, MD      . sodium chloride 0.9 % injection 3 mL  3 mL Intravenous Q12H Jani Gravel, MD      . sodium chloride 0.9 % injection 3 mL  3 mL Intravenous Q12H Jani Gravel, MD      . sodium chloride 0.9 % injection 3 mL  3 mL Intravenous PRN Jani Gravel, MD      . topiramate (TOPAMAX) tablet 150 mg  150 mg Oral Daily Jani Gravel, MD      . torsemide [facility]) tablet 20 mg  20 mg Oral QAC breakfast Jani Gravel, MD   20 mg at 10/30/13 0943  . tretinoin (RETIN-A) 0.1 % cream 1 application  1 application Topical QHS Jani Gravel, MD      . venlafaxine XR (EFFEXOR-XR) 24 hr capsule 150 mg  150 mg Oral q morning - 10a Jani Gravel, MD   150 mg at 10/30/13 0944  . zolpidem (AMBIEN) tablet 5 mg  5 mg Oral QHS PRN Jani Gravel, MD        Past Medical History  Diagnosis Date  . Diabetes mellitus   . Hyperlipidemia   . Blood transfusion   . Arthritis   . History of back surgery     multiple  . Hypertension   . Hernia     incisional  . Wears glasses   .  MRSA (methicillin resistant Staphylococcus aureus)   . Full dentures   . Chronic constipation   . Umbilical hernia   . Headache(784.0)   . Dizziness   . Concussion     HAS HAD DIZZINESS/ MEMORY  PROBLEMS/ SLOW SPEACH /BALANCE PROBLEMS SINCE CONCUSSION 1 1/2 YR AGO PR GOES TO THERAPY  . Fibromyalgia   . Scar   . GERD (gastroesophageal reflux disease)   . Chronic stomach ulcer   . Nocturia   . Sleep difficulties   . Depression   . Anxiety   . Brain injury     1 1/2 YR AGO  . Concussion   . Diabetes mellitus   . Paroxysmal vertigo   . Stroke     crica 2002    Past Surgical History  Procedure Laterality Date  . Cholecystectomy  1984  . Spinal fusion  2006  . Eye surgery  2010    bilateral  . Abdominal hysterectomy    . Cerebral aneurysm repair    . Back surgery      SPINAL FUSION X6   . Incisional hernia repair  05/19/2011    Procedure: HERNIA REPAIR INCISIONAL;  Surgeon: Willey Blade, MD;  Location: [facility];  Service: General;  Laterality: N/A;  repair incisional hernia with mesh  . Hernia repair      09/21/2010  . Hernia repair  05/19/11    incisional hernia repair   . Appendectomy    . Tubal ligation      History   Social History  . Marital Status: Married    Spouse Name: N/A    Number of Children: N/A  . Years of Education: N/A   Occupational History  . disabled    Social History Main Topics  . Smoking status: Current Every Day Smoker -- 0.25 packs/day for 38 years    Types: Cigarettes  . Smokeless tobacco: Never Used  . Alcohol Use: No  . Drug Use: No  . Sexual Activity: Not on file   Other Topics Concern  . Not on file   Social History Narrative  . No narrative on file     No family history of premature CAD in 1st degree relatives.  Prior to Admission medications   Medication Sig Start Date End Date Taking? Authorizing Provider  ALPRAZolam Duanne Moron) 1 MG tablet Take 1 mg by mouth 3 (three) times daily.  10/01/13  Yes Historical Provider, MD  atorvastatin (LIPITOR) 40 MG tablet Take 40 mg by mouth daily.   Yes Historical Provider, MD  desvenlafaxine (PRISTIQ) 100 MG 24 hr tablet Take 100 mg by mouth every morning.    Yes Historical  Provider, MD  esomeprazole (NEXIUM) 40 MG capsule Take 40 mg by mouth daily.    Yes Historical Provider, MD  estradiol (ESTRACE) 1 MG tablet Take 1 mg by mouth daily.   Yes Historical Provider, MD  fesoterodine (TOVIAZ) 4 MG TB24 tablet Take 4 mg by mouth daily.   Yes Historical Provider, MD  gabapentin (NEURONTIN) 600 MG tablet Take 600 mg by mouth 3 (three) times daily.   Yes Historical Provider, MD  ibuprofen (ADVIL,MOTRIN) 800 MG tablet Take 800 mg by mouth every 8 (eight) hours as needed.   Yes Historical Provider, MD  imipramine (TOFRANIL) 50 MG tablet Take 50 mg by mouth at bedtime.    Yes Historical Provider, MD  insulin glargine (LANTUS) 100 UNIT/ML injection Inject 28 Units into the skin at bedtime.   Yes Historical Provider, MD  insulin lispro (HUMALOG) 100 UNIT/ML injection Inject 1-4 Units into the skin 3 (three) times daily before meals. Sliding scale   Yes Historical Provider, MD  lisinopril (PRINIVIL,ZESTRIL) 20 MG tablet Take 20 mg by mouth daily.  10/02/13  Yes Historical Provider, MD  lubiprostone (AMITIZA) 24 MCG capsule Take 24 mcg by mouth 2 (two) times daily with a meal.   Yes Historical Provider, MD  methocarbamol (ROBAXIN) 500 MG tablet Take 500 mg by mouth 3 (three) times daily as needed for muscle spasms.    Yes Historical Provider, MD  methylphenidate (RITALIN) 20 MG tablet Take 1 tablet (20 mg total) by mouth 2 (two) times daily. 10/17/13  Yes Meredith Staggers, MD  minocycline (DYNACIN) 100 MG tablet Take 100 mg by mouth 2 (two) times daily.   Yes Historical Provider, MD  NEXIUM 40 MG capsule Take 40 mg by mouth daily. 08/30/13  Yes Historical Provider, MD  olmesartan (BENICAR) 20 MG tablet Take 10 mg by mouth daily. One half daily 09/21/13  Yes Tanda Rockers, MD  oxyCODONE-acetaminophen (PERCOCET) 10-325 MG per tablet Take 1 tablet by mouth every 4 (four) hours as needed for pain.   Yes Historical Provider, MD  Oxymorphone ER, Crush Resist, (OPANA ER, CRUSH RESISTANT,) 15  MG T12A Take 15 mg by mouth every 12 (twelve) hours. 10/17/13  Yes Meredith Staggers, MD  promethazine (PHENERGAN) 25 MG tablet Take 25 mg by mouth every 6 (six) hours as needed for nausea.   Yes Historical Provider, MD  RESTASIS 0.05 % ophthalmic emulsion Place 1 drop into both eyes 2 (two) times daily.  10/08/13  Yes Historical Provider, MD  rivastigmine (EXELON) 3 MG capsule Take 1 capsule (3 mg total) by mouth 2 (two) times daily. 10/17/13  Yes Meredith Staggers, MD  topiramate (TOPAMAX) 100 MG tablet Take 1.5 tablets (150 mg total) by mouth daily. 08/31/13  Yes Meredith Staggers, MD  torsemide (DEMADEX) 20 MG tablet Take 20 mg by mouth every morning.    Yes Historical Provider, MD  tretinoin (RETIN-A) 0.1 % cream Apply 1 application topically at bedtime.   Yes Historical Provider, MD  zolpidem (AMBIEN CR) 12.5 MG CR tablet Take 12.5 mg by mouth at bedtime as needed for sleep.   Yes Historical Provider, MD  halobetasol (ULTRAVATE) 0.05 % cream Apply 1 application topically 2 (two) times daily.    Historical Provider, MD  rizatriptan (MAXALT) 10 MG tablet Take 10 mg by mouth as needed for migraine. May repeat in 2 hours if needed    Historical Provider, MD  scopolamine (TRANSDERM-SCOP) 1 MG/3DAYS Place 1 patch (1.5 mg total) onto the skin every 3 (three) days. 10/17/13   Meredith Staggers, MD     Review of systems complete and found to be negative unless listed above in HPI     Physical exam Blood pressure 133/68, pulse 69, temperature 97.5 F (36.4 C), temperature source Oral, resp. rate 18, height 5\' 4"  (1.626 m), weight 164 lb 14.4 oz (74.798 kg), SpO2 100.00%. General: NAD Neck: No JVD, no thyromegaly or thyroid nodule.  Lungs: Clear to auscultation bilaterally with normal respiratory effort. CV: Nondisplaced PMI.  Heart regular S1/S2, no S3/S4, no murmur.  No peripheral edema.  No carotid bruit.  Normal pedal pulses.  Abdomen: Soft, nontender, no hepatosplenomegaly, no distention.  Skin:  Intact without lesions or rashes.  Neurologic: Alert and oriented x 3.  Psych: Normal affect. Extremities: No clubbing or cyanosis.  HEENT: Normal.  Labs:   Lab Results  Component Value Date   WBC 6.2 10/29/2013   HGB 13.7 10/29/2013   HCT 39.7 10/29/2013   MCV 84.6 10/29/2013   PLT 164 10/29/2013    Recent Labs Lab 10/29/13 2239  NA 140  K 3.5*  CL 102  CO2 27  BUN 12  CREATININE 0.77  CALCIUM 9.4  GLUCOSE 137*   Lab Results  Component Value Date   TROPONINI <0.30 10/30/2013    Lab Results  Component Value Date   CHOL 184 10/30/2013   Lab Results  Component Value Date   HDL 40 10/30/2013   Lab Results  Component Value Date   LDLCALC 95 10/30/2013   Lab Results  Component Value Date   TRIG 244* 10/30/2013   Lab Results  Component Value Date   CHOLHDL 4.6 10/30/2013   No results found for this basename: LDLDIRECT       Studies: Dg Chest Portable 1 View  10/29/2013   CLINICAL DATA:  Chest pain.  EXAM: PORTABLE CHEST - 1 VIEW  COMPARISON:  09/21/2013  FINDINGS: Single view of the chest demonstrates clear lungs. Prominent lung markings are not significantly changed. No evidence for pulmonary edema. Heart size is normal. No focal airspace disease.  IMPRESSION: No acute cardiopulmonary disease.   Electronically Signed   By: Markus Daft M.D.   On: 10/29/2013 23:08    ASSESSMENT AND PLAN:  1. Chest pain: Again, she has ruled out for an acute coronary syndrome with normal troponins, and her ECG is unremarkable. During my exam, some of her chest pain was exacerbated with movement pointing to a musculoskeletal etiology. However, given her HTN, CVA, hyperlipidemia, IDDM, and ongoing tobacco use, she has numerous cardiovascular risk factors giving her a reasonable likelihood of having ischemic heart disease.  It appears she had some cardiovascular testing in the fall of 2014, which may have included an echocardiogram and stress test. I will obtain these results before  considering additional testing. Continue ASA and statin therapy for now. If previously performed cardiac testing is unremarkable, I would consider the addition of short-acting nitrates and close follow up with Dr. Einar Gip (her cardiologist). Given her COPD and tobacco abuse, I would avoid beta blockers for now. 2. HTN: Elevated on admission, but currently controlled on Avapro 150 mg daily. 3. Hyperlipidemia: Controlled, with LDL 95, HDL 40 on Lipitor 40 mg daily.   Signed: Kate Sable, M.D., F.A.C.C.  10/30/2013, 10:37 AM  ADDENDUM: I have reviewed cardiology clinic notes dated 09/18/13, and all relevant prior cardiac testing. She had an echo on 05/03/13 which showed normal LV systolic function, normal wall motion, EF 55-60%, trivial valvular regurgitation, and grade I diastolic dysfunction.  She underwent a nuclear myocardial perfusion study on 04/06/13 which showed no evidence of ischemia or scar, calculated LVEF 63%.  I recommend optimizing medical therapy with the addition of isosorbide dinitrate 10 mg tid, and have her f/u with Dr. Einar Gip in the near future.

## 2013-10-30 NOTE — Progress Notes (Signed)
7:41 AM I agree with HPI/GPe and A/P per Dr. Jani Gravel  58 y/o ?, known h/o Htn, DM ty 2, Post concussive syndrome + vastibular insufficiecny, Hld, Basilar Artery anerusym s/p coililng, recurrent incisional hernias, h/o vag hys 12/2004, L sided L5-S1 stenosis, COPD and still smoker admitted overnight 10/30/13 with chest pain. States chest pain started yesterday morning, was intermittent in left rest with radiation down left arm. Patient still chronic smoker has had a rash and some bursitis. She states pain is controlled right now but she is thirsty at present time She's never had pain like this before but has had pains in other joints in addition She has some left lower extremity swelling and discomfort which is not noticeable on exam   Patient Active Problem List   Diagnosis Date Noted  . Chest pain 10/30/2013  . 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 Dyspnea 09/21/2013  . Smoker 09/21/2013  . HBP (high blood pressure) 09/21/2013  . Myofascial muscle pain 08/20/2013  . Basilar artery narrowing 07/28/2012  . Anxiety and depression 05/30/2012  . Abdominal pain 12/01/2011  . Concussion   . Diabetes mellitus   . Paroxysmal vertigo   . Umbilical hernia without mention of obstruction or gangrene 04/06/2011  . Post concussive syndrome 03/29/2011  . Benign paroxysmal positional vertigo 01/27/2011    chest pain rule out acute coronary syndrome-patient will be scheduled for a stress test we will consult cardiology and we will determine best avenue for intensive therapy given aspirin 325 mg in ED Diabetes mellitus type 2-continue every 6 hourly coverag blood sugars 1 2130 Hypertension-moderately well controlled Mild hypokalemia-monitor in a.m. Chronic pain syndrome-continue Percocet every 4 when necessary, gabapentin 600 3 times a day, Robaxin 500 3 times a day, MS Contin 30 every 12 Postconcussive syndrome plus cognitive dysfunction-per rehabilitation as  an outpatient-for now continue Tofranil 50 each bedtime, Ritalin 20 twice a day, alprazolam 1 mg 3 times a day Still smoker-counseled to quit Depression continue Effexor 150 every morning  Migraines-continue tube max 150 daily

## 2013-10-30 NOTE — Progress Notes (Signed)
UR Completed.  Darriel Utter Jane Jayston Trevino [phone] 10/30/2013  

## 2013-10-30 NOTE — Progress Notes (Signed)
Pt's blood sugar 53. Pt drank Orange juice and ate graham crackers and peanut butter. Blood sugar up to 148.

## 2013-10-31 DIAGNOSIS — I10 Essential (primary) hypertension: Secondary | ICD-10-CM | POA: Diagnosis not present

## 2013-10-31 DIAGNOSIS — E119 Type 2 diabetes mellitus without complications: Secondary | ICD-10-CM | POA: Diagnosis not present

## 2013-10-31 DIAGNOSIS — E785 Hyperlipidemia, unspecified: Secondary | ICD-10-CM | POA: Diagnosis not present

## 2013-10-31 DIAGNOSIS — R079 Chest pain, unspecified: Secondary | ICD-10-CM | POA: Diagnosis not present

## 2013-10-31 LAB — CK TOTAL AND CKMB (NOT AT ARMC)
CK TOTAL: 50 U/L (ref 7–177)
CK, MB: 1.5 ng/mL (ref 0.3–4.0)
CK, MB: 1.5 ng/mL (ref 0.3–4.0)
Relative Index: INVALID (ref 0.0–2.5)
Relative Index: INVALID (ref 0.0–2.5)
Total CK: 50 U/L (ref 7–177)

## 2013-10-31 LAB — GLUCOSE, CAPILLARY
Glucose-Capillary: 195 mg/dL — ABNORMAL HIGH (ref 70–99)
Glucose-Capillary: 196 mg/dL — ABNORMAL HIGH (ref 70–99)
Glucose-Capillary: 144 mg/dL — ABNORMAL HIGH (ref 70–99)
Glucose-Capillary: 217 mg/dL — ABNORMAL HIGH (ref 70–99)

## 2013-10-31 LAB — CBC
HCT: 35.3 % — ABNORMAL LOW (ref 36.0–46.0)
Hemoglobin: 11.7 g/dL — ABNORMAL LOW (ref 12.0–15.0)
MCH: 28.7 pg (ref 26.0–34.0)
MCHC: 33.1 g/dL (ref 30.0–36.0)
MCV: 86.5 fL (ref 78.0–100.0)
PLATELETS: 143 10*3/uL — AB (ref 150–400)
RBC: 4.08 MIL/uL (ref 3.87–5.11)
RDW: 13.7 % (ref 11.5–15.5)
WBC: 6.7 10*3/uL (ref 4.0–10.5)

## 2013-10-31 LAB — TROPONIN I: Troponin I: <0.3 ng/mL (ref <0.30)

## 2013-10-31 MED ORDER — ISOSORBIDE MONONITRATE 10 MG PO TABS: 10.0000 mg | ORAL_TABLET | Freq: Three times a day (TID) | ORAL | Status: DC

## 2013-10-31 MED ORDER — PANTOPRAZOLE SODIUM 40 MG PO TBEC: 40.0000 mg | DELAYED_RELEASE_TABLET | Freq: Every day | ORAL | Status: DC

## 2013-10-31 MED ORDER — NICOTINE 14 MG/24HR TD PT24: 14.0000 mg | MEDICATED_PATCH | Freq: Every day | TRANSDERMAL | Status: DC

## 2013-10-31 MED ORDER — RANITIDINE HCL 150 MG PO TABS: 150.0000 mg | ORAL_TABLET | Freq: Two times a day (BID) | ORAL | Status: DC

## 2013-10-31 MED ORDER — NITROGLYCERIN 0.4 MG/SPRAY TL SOLN: 1.0000 | Status: DC | PRN

## 2013-10-31 NOTE — Discharge Summary (Signed)
Physician Discharge Summary  TALAJAH SLIMP QHU:[id number] DOB: [birth year] DOA: 10/29/2013  PCP: Shirline Frees, MD  Admit date: 10/29/2013 Discharge date: 10/31/2013  Time spent: 25 minutes  Recommendations for Outpatient Follow-up:  1. Please followup with cardiologist as an outpatient Dr. Duke Salvia who has been cc Nathaneil Canary note 2. Please get a complete metabolic panel in one week 3. Please followup of chronic pain physician/regular physician for refills of other medications 4. Consider endoscopy if her symptoms persist as well as empiric treatment for H. pylori   Discharge Diagnoses:  Active Problems:   Chest pain   Discharge Condition: Fair  Diet recommendation: Regular  Filed Weights   10/29/13 2157 10/30/13 0408 10/31/13 0448  Weight: 69.854 kg (154 lb) 74.798 kg (164 lb 14.4 oz) 74.889 kg (165 lb 1.6 oz)    History of present illness:  58 y/o ?, known h/o Htn, DM ty 2, Post concussive syndrome + vastibular insufficiecny, Hld, Basilar Artery anerusym s/p coililng, recurrent incisional hernias, h/o vag hys 12/2004, L sided L5-S1 stenosis, COPD and still smoker admitted overnight 10/30/13 with chest pain. States chest pain started yesterday morning, was intermittent in left rest with radiation down left arm.  Patient still chronic smoker has had a rash and some bursitis. She states pain is controlled right now but she is thirsty at present time  She's never had pain like this before but has had pains in other joints in addition  She has some left lower extremity swelling and discomfort which is not noticeable on exam   Hospital Course:  [facility] a [facility] and ruled out by cardiac enzymes x3 Cardiology saw the patient in consult and noted that she had an echocardiogram and stress test in 2014 It was noted that patient was seen 09/18/2013 as well as she had a nuclear stress test 10/2-40 scar ischemia EF was 63%. It was recommended on a discharge that she take isosorbide  dinitrate 10 mg 3 times a day and followup with Dr. Duke Salvia She had couple of episodes of fleeting chest pain overnight prior to discharge but was stable and met maximal hospitalization benefit prior to discharge Blood sugar during hospital stay was well controlled between 1 4190 and her blood pressure is moderately well controlled  Procedures:  EKGs  Consultations:  Cardiology  Discharge Exam: Filed Vitals:   10/31/13 0448  BP: 146/70  Pulse: 91  Temp: 97.6 F (36.4 C)  Resp: 20   1 alert sleepy but slow mentation   not concerned Cardiovascular: S1-S2 no murmur Respiratory: Clear  Discharge Instructions You were cared for by a hospitalist during your hospital stay. If you have any questions about your discharge medications or the care you received while you were in the hospital after you are discharged, you can call the unit and asked to speak with the hospitalist on call if the hospitalist that took care of you is not available. Once you are discharged, your primary care physician will handle any further medical issues. Please note that NO REFILLS for any discharge medications will be authorized once you are discharged, as it is imperative that you return to your primary care physician (or establish a relationship with a primary care physician if you do not have one) for your aftercare needs so that they can reassess your need for medications and monitor your lab values.  Discharge Orders   Future Appointments Provider Department Dept Phone   11/14/2013 10:30 AM Danella Sensing, NP [facility]  [phone]   Future Orders Complete By Expires   Diet - low sodium heart healthy  As directed    Discharge instructions  As directed    Increase activity slowly  As directed        Medication List    STOP taking these medications       esomeprazole 40 MG capsule  Commonly known as:  [place] by:  pantoprazole 40 MG tablet     ibuprofen  800 MG tablet  Commonly known as:  ADVIL,MOTRIN     NEXIUM 40 MG capsule  Generic drug:  esomeprazole      TAKE these medications       ALPRAZolam 1 MG tablet  Commonly known as:  XANAX  Take 1 mg by mouth 3 (three) times daily.     AMBIEN CR 12.5 MG CR tablet  Generic drug:  zolpidem  Take 12.5 mg by mouth at bedtime as needed for sleep.     atorvastatin 40 MG tablet  Commonly known as:  LIPITOR  Take 40 mg by mouth daily.     desvenlafaxine 100 MG 24 hr tablet  Commonly known as:  PRISTIQ  Take 100 mg by mouth every morning.     estradiol 1 MG tablet  Commonly known as:  ESTRACE  Take 1 mg by mouth daily.     gabapentin 600 MG tablet  Commonly known as:  NEURONTIN  Take 600 mg by mouth 3 (three) times daily.     halobetasol 0.05 % cream  Commonly known as:  ULTRAVATE  Apply 1 application topically 2 (two) times daily.     imipramine 50 MG tablet  Commonly known as:  TOFRANIL  Take 50 mg by mouth at bedtime.     insulin glargine 100 UNIT/ML injection  Commonly known as:  LANTUS  Inject 28 Units into the skin at bedtime.     insulin lispro 100 UNIT/ML injection  Commonly known as:  HUMALOG  Inject 1-4 Units into the skin 3 (three) times daily before meals. Sliding scale     lisinopril 20 MG tablet  Commonly known as:  PRINIVIL,ZESTRIL  Take 20 mg by mouth daily.     lubiprostone 24 MCG capsule  Commonly known as:  AMITIZA  Take 24 mcg by mouth 2 (two) times daily with a meal.     methocarbamol 500 MG tablet  Commonly known as:  ROBAXIN  Take 500 mg by mouth 3 (three) times daily as needed for muscle spasms.     methylphenidate 20 MG tablet  Commonly known as:  RITALIN  Take 1 tablet (20 mg total) by mouth 2 (two) times daily.     minocycline 100 MG tablet  Commonly known as:  DYNACIN  Take 100 mg by mouth 2 (two) times daily.     nicotine 14 mg/24hr patch  Commonly known as:  NICODERM CQ - dosed in mg/24 hours  Place 1 patch (14 mg total) onto  the skin daily.     olmesartan 20 MG tablet  Commonly known as:  BENICAR  Take 10 mg by mouth daily. One half daily     oxyCODONE-acetaminophen 10-325 MG per tablet  Commonly known as:  PERCOCET  Take 1 tablet by mouth every 4 (four) hours as needed for pain.     Oxymorphone ER (Crush Resist) 15 MG T12a  Commonly known as:  OPANA ER (CRUSH RESISTANT)  Take 15 mg by mouth every 12 (twelve) hours.  pantoprazole 40 MG tablet  Commonly known as:  PROTONIX  Take 1 tablet (40 mg total) by mouth daily.     promethazine 25 MG tablet  Commonly known as:  PHENERGAN  Take 25 mg by mouth every 6 (six) hours as needed for nausea.     ranitidine 150 MG tablet  Commonly known as:  ZANTAC  Take 1 tablet (150 mg total) by mouth 2 (two) times daily.     RESTASIS 0.05 % ophthalmic emulsion  Generic drug:  cycloSPORINE  Place 1 drop into both eyes 2 (two) times daily.     rivastigmine 3 MG capsule  Commonly known as:  EXELON  Take 1 capsule (3 mg total) by mouth 2 (two) times daily.     rizatriptan 10 MG tablet  Commonly known as:  MAXALT  Take 10 mg by mouth as needed for migraine. May repeat in 2 hours if needed     scopolamine 1 MG/3DAYS  Commonly known as:  TRANSDERM-SCOP  Place 1 patch (1.5 mg total) onto the skin every 3 (three) days.     topiramate 100 MG tablet  Commonly known as:  TOPAMAX  Take 1.5 tablets (150 mg total) by mouth daily.     torsemide 20 MG tablet  Commonly known as:  DEMADEX  Take 20 mg by mouth every morning.     TOVIAZ 4 MG Tb24 tablet  Generic drug:  fesoterodine  Take 4 mg by mouth daily.     tretinoin 0.1 % cream  Commonly known as:  RETIN-A  Apply 1 application topically at bedtime.       Allergies  Allergen Reactions  . Cymbalta [Duloxetine Hcl] Anaphylaxis  . Latex Anaphylaxis      The results of significant diagnostics from this hospitalization (including imaging, microbiology, ancillary and laboratory) are listed below for  reference.    Significant Diagnostic Studies: Dg Chest Portable 1 View  10/29/2013   CLINICAL DATA:  Chest pain.  EXAM: PORTABLE CHEST - 1 VIEW  COMPARISON:  09/21/2013  FINDINGS: Single view of the chest demonstrates clear lungs. Prominent lung markings are not significantly changed. No evidence for pulmonary edema. Heart size is normal. No focal airspace disease.  IMPRESSION: No acute cardiopulmonary disease.   Electronically Signed   By: Markus Daft M.D.   On: 10/29/2013 23:08    Microbiology: Recent Results (from the past 240 hour(s))  MRSA PCR SCREENING     Status: None   Collection Time    10/30/13  3:40 PM      Result Value Ref Range Status   MRSA by PCR NEGATIVE  NEGATIVE Final   Comment:            The GeneXpert MRSA Assay (FDA     approved for NASAL specimens     only), is one component of a     comprehensive MRSA colonization     surveillance program. It is not     intended to diagnose MRSA     infection nor to guide or     monitor treatment for     MRSA infections.     Labs: Basic Metabolic Panel:  Recent Labs Lab 10/29/13 2239  NA 140  K 3.5*  CL 102  CO2 27  GLUCOSE 137*  BUN 12  CREATININE 0.77  CALCIUM 9.4   Liver Function Tests: No results found for this basename: AST, ALT, ALKPHOS, BILITOT, PROT, ALBUMIN,  in the last 168 hours  Recent Labs Lab  10/29/13 2304  LIPASE 51   No results found for this basename: AMMONIA,  in the last 168 hours CBC:  Recent Labs Lab 10/29/13 2239 10/31/13 0459  WBC 6.2 6.7  NEUTROABS 3.8  --   HGB 13.7 11.7*  HCT 39.7 35.3*  MCV 84.6 86.5  PLT 164 143*   Cardiac Enzymes:  Recent Labs Lab 10/29/13 2239 10/30/13 0715 10/30/13 1342 10/31/13 0459  TROPONINI <0.30 <0.30 <0.30 <0.30   BNP: BNP (last 3 results) No results found for this basename: PROBNP,  in the last 8760 hours CBG:  Recent Labs Lab 10/30/13 2023 10/30/13 2100 10/31/13 0022 10/31/13 0424 10/31/13 0713  GLUCAP 57* 148* 195* 196*  144*       Signed:  Jai-Gurmukh Anniah Glick  [facility] 10/31/2013, 8:41 AM

## 2013-10-31 NOTE — Progress Notes (Signed)
Notified by patient that she was having chest pain 10 out of 10 and the pain was radiating down the left arm. Patient received 1mg  of morphine, 2L of oxygen was placed, patients VS were within normal limits, and EKG was preformed and no new changes were noticed from previous. MD was notified. MD ordered troponin q4 x3 and CKMB q4 X3. Will continue to monitor patient.

## 2013-11-01 ENCOUNTER — Other Ambulatory Visit: Payer: Self-pay

## 2013-11-01 ENCOUNTER — Emergency Department (HOSPITAL_COMMUNITY)
Admission: EM | Admit: 2013-11-01 | Discharge: 2013-11-02 | Disposition: A | Discharge status: Home / Self Care | Payer: Medicare Other | Attending: Emergency Medicine | Admitting: Emergency Medicine

## 2013-11-01 ENCOUNTER — Encounter (HOSPITAL_COMMUNITY): Payer: Self-pay | Admitting: Emergency Medicine

## 2013-11-01 DIAGNOSIS — Z8614 Personal history of Methicillin resistant Staphylococcus aureus infection: Secondary | ICD-10-CM | POA: Diagnosis not present

## 2013-11-01 DIAGNOSIS — Z8711 Personal history of peptic ulcer disease: Secondary | ICD-10-CM | POA: Diagnosis not present

## 2013-11-01 DIAGNOSIS — F3289 Other specified depressive episodes: Secondary | ICD-10-CM | POA: Diagnosis not present

## 2013-11-01 DIAGNOSIS — Z792 Long term (current) use of antibiotics: Secondary | ICD-10-CM | POA: Diagnosis not present

## 2013-11-01 DIAGNOSIS — Z872 Personal history of diseases of the skin and subcutaneous tissue: Secondary | ICD-10-CM | POA: Diagnosis not present

## 2013-11-01 DIAGNOSIS — M129 Arthropathy, unspecified: Secondary | ICD-10-CM | POA: Insufficient documentation

## 2013-11-01 DIAGNOSIS — Z87820 Personal history of traumatic brain injury: Secondary | ICD-10-CM | POA: Insufficient documentation

## 2013-11-01 DIAGNOSIS — R0789 Other chest pain: Secondary | ICD-10-CM | POA: Diagnosis not present

## 2013-11-01 DIAGNOSIS — K5900 Constipation, unspecified: Secondary | ICD-10-CM | POA: Insufficient documentation

## 2013-11-01 DIAGNOSIS — F411 Generalized anxiety disorder: Secondary | ICD-10-CM | POA: Insufficient documentation

## 2013-11-01 DIAGNOSIS — Z8673 Personal history of transient ischemic attack (TIA), and cerebral infarction without residual deficits: Secondary | ICD-10-CM | POA: Insufficient documentation

## 2013-11-01 DIAGNOSIS — R5383 Other fatigue: Secondary | ICD-10-CM

## 2013-11-01 DIAGNOSIS — E785 Hyperlipidemia, unspecified: Secondary | ICD-10-CM | POA: Insufficient documentation

## 2013-11-01 DIAGNOSIS — F17200 Nicotine dependence, unspecified, uncomplicated: Secondary | ICD-10-CM | POA: Diagnosis not present

## 2013-11-01 DIAGNOSIS — R5381 Other malaise: Secondary | ICD-10-CM | POA: Insufficient documentation

## 2013-11-01 DIAGNOSIS — Z8739 Personal history of other diseases of the musculoskeletal system and connective tissue: Secondary | ICD-10-CM | POA: Insufficient documentation

## 2013-11-01 DIAGNOSIS — Z87828 Personal history of other (healed) physical injury and trauma: Secondary | ICD-10-CM | POA: Diagnosis not present

## 2013-11-01 DIAGNOSIS — R079 Chest pain, unspecified: Secondary | ICD-10-CM

## 2013-11-01 DIAGNOSIS — F329 Major depressive disorder, single episode, unspecified: Secondary | ICD-10-CM | POA: Diagnosis not present

## 2013-11-01 DIAGNOSIS — Z794 Long term (current) use of insulin: Secondary | ICD-10-CM | POA: Diagnosis not present

## 2013-11-01 DIAGNOSIS — Z91040 Latex allergy status: Secondary | ICD-10-CM | POA: Diagnosis not present

## 2013-11-01 DIAGNOSIS — K219 Gastro-esophageal reflux disease without esophagitis: Secondary | ICD-10-CM | POA: Diagnosis not present

## 2013-11-01 DIAGNOSIS — E119 Type 2 diabetes mellitus without complications: Secondary | ICD-10-CM | POA: Diagnosis not present

## 2013-11-01 DIAGNOSIS — Z79899 Other long term (current) drug therapy: Secondary | ICD-10-CM | POA: Diagnosis not present

## 2013-11-01 DIAGNOSIS — I10 Essential (primary) hypertension: Secondary | ICD-10-CM | POA: Diagnosis not present

## 2013-11-01 DIAGNOSIS — Z9889 Other specified postprocedural states: Secondary | ICD-10-CM | POA: Diagnosis not present

## 2013-11-01 LAB — BASIC METABOLIC PANEL
BUN: 14 mg/dL (ref 6–23)
CHLORIDE: 102 meq/L (ref 96–112)
CO2: 22 mEq/L (ref 19–32)
CREATININE: 0.58 mg/dL (ref 0.50–1.10)
Calcium: 8.9 mg/dL (ref 8.4–10.5)
GFR calc Af Amer: >90 mL/min (ref >90)
GFR calc non Af Amer: >90 mL/min (ref >90)
Glucose, Bld: 251 mg/dL — ABNORMAL HIGH (ref 70–99)
Potassium: 3.6 mEq/L — ABNORMAL LOW (ref 3.7–5.3)
Sodium: 138 mEq/L (ref 137–147)

## 2013-11-01 LAB — CBC
HEMATOCRIT: 37.7 % (ref 36.0–46.0)
Hemoglobin: 12.8 g/dL (ref 12.0–15.0)
MCH: 29 pg (ref 26.0–34.0)
MCHC: 34 g/dL (ref 30.0–36.0)
MCV: 85.5 fL (ref 78.0–100.0)
Platelets: 142 10*3/uL — ABNORMAL LOW (ref 150–400)
RBC: 4.41 MIL/uL (ref 3.87–5.11)
RDW: 13.6 % (ref 11.5–15.5)
WBC: 7.7 10*3/uL (ref 4.0–10.5)

## 2013-11-01 LAB — I-STAT TROPONIN, ED: Troponin i, poc: 0 ng/mL (ref 0.00–0.08)

## 2013-11-01 MED ORDER — ACETAMINOPHEN 325 MG PO TABS
650.0000 mg | ORAL_TABLET | Freq: Once | ORAL | Status: AC
  Administered 2013-11-02: 650 mg via ORAL
  Filled 2013-11-01: qty 2

## 2013-11-01 NOTE — Discharge Instructions (Signed)
There is no indication, that her heart is causing your chest pain, tonight. Use Tylenol, or ibuprofen, for pain. You can try using a heating pad on the sore area. Follow up for the Cardiac Catheterization on Monday as scheduled.   Chest Pain (Nonspecific) It is often hard to give a specific diagnosis for the cause of chest pain. There is always a chance that your pain could be related to something serious, such as a heart attack or a blood clot in the lungs. You need to follow up with your caregiver for further evaluation. CAUSES   Heartburn.  Pneumonia or bronchitis.  Anxiety or stress.  Inflammation around your heart (pericarditis) or lung (pleuritis or pleurisy).  A blood clot in the lung.  A collapsed lung (pneumothorax). It can develop suddenly on its own (spontaneous pneumothorax) or from injury (trauma) to the chest.  Shingles infection (herpes zoster virus). The chest wall is composed of bones, muscles, and cartilage. Any of these can be the source of the pain.  The bones can be bruised by injury.  The muscles or cartilage can be strained by coughing or overwork.  The cartilage can be affected by inflammation and become sore (costochondritis). DIAGNOSIS  Lab tests or other studies, such as X-rays, electrocardiography, stress testing, or cardiac imaging, may be needed to find the cause of your pain.  TREATMENT   Treatment depends on what may be causing your chest pain. Treatment may include:  Acid blockers for heartburn.  Anti-inflammatory medicine.  Pain medicine for inflammatory conditions.  Antibiotics if an infection is present.  You may be advised to change lifestyle habits. This includes stopping smoking and avoiding alcohol, caffeine, and chocolate.  You may be advised to keep your head raised (elevated) when sleeping. This reduces the chance of acid going backward from your stomach into your esophagus.  Most of the time, nonspecific chest pain will  improve within 2 to 3 days with rest and mild pain medicine. HOME CARE INSTRUCTIONS   If antibiotics were prescribed, take your antibiotics as directed. Finish them even if you start to feel better.  For the next few days, avoid physical activities that bring on chest pain. Continue physical activities as directed.  Do not smoke.  Avoid drinking alcohol.  Only take over-the-counter or prescription medicine for pain, discomfort, or fever as directed by your caregiver.  Follow your caregiver's suggestions for further testing if your chest pain does not go away.  Keep any follow-up appointments you made. If you do not go to an appointment, you could develop lasting (chronic) problems with pain. If there is any problem keeping an appointment, you must call to reschedule. SEEK MEDICAL CARE IF:   You think you are having problems from the medicine you are taking. Read your medicine instructions carefully.  Your chest pain does not go away, even after treatment.  You develop a rash with blisters on your chest. SEEK IMMEDIATE MEDICAL CARE IF:   You have increased chest pain or pain that spreads to your arm, neck, jaw, back, or abdomen.  You develop shortness of breath, an increasing cough, or you are coughing up blood.  You have severe back or abdominal pain, feel nauseous, or vomit.  You develop severe weakness, fainting, or chills.  You have a fever. THIS IS AN EMERGENCY. Do not wait to see if the pain will go away. Get medical help at once. Call your local emergency services (911 in [place] Do not drive yourself to the hospital.  MAKE SURE YOU:   Understand these instructions.  Will watch your condition.  Will get help right away if you are not doing well or get worse. Document Released: 03/31/2005 Document Revised: 09/13/2011 Document Reviewed: 01/25/2008 [facility] Patient Information 2014 [place].

## 2013-11-01 NOTE — Progress Notes (Signed)
Discharge instructions and prescriptions given, verbalized understanding, out in stable condition via w/c with staff. 

## 2013-11-01 NOTE — ED Notes (Signed)
Didn't take today's aspirin b/c... Didn't think about it.

## 2013-11-01 NOTE — ED Provider Notes (Signed)
CSN: [id number]     Arrival date & time 11/01/13  1909 History   First MD Initiated Contact with Patient 11/01/13 2049     Chief Complaint  Patient presents with  . Chest Pain     (Consider location/radiation/quality/duration/timing/severity/associated sxs/prior Treatment) HPI  Mallory Mendez is a 58 y.o. female who is here for chest pain. That started after she went to a restaurant tonight to  Eat.  The chest pain, is sharp, left anterior, and radiates to her left arm. Since starting, the chest discomfort has waxed and waned, but not gone away completely. It has been present for 4 hours. She feels "weak all over." She saw her cardiologist today who told her that she needed a cardiac catheterization, and has been scheduled for 4 days from now. She was released from the hospital yesterday after patient for chest pain. She's never had a catheterization previously. She denies nausea or vomiting. There are no other known modifying factors.    Past Medical History  Diagnosis Date  . Diabetes mellitus   . Hyperlipidemia   . Blood transfusion   . Arthritis   . History of back surgery     multiple  . Hypertension   . Hernia     incisional  . Wears glasses   . MRSA (methicillin resistant Staphylococcus aureus)   . Full dentures   . Chronic constipation   . Umbilical hernia   . Headache(784.0)   . Dizziness   . Concussion     HAS HAD DIZZINESS/ MEMORY PROBLEMS/ SLOW SPEACH /BALANCE PROBLEMS SINCE CONCUSSION 1 1/2 YR AGO PR GOES TO THERAPY  . Fibromyalgia   . Scar   . GERD (gastroesophageal reflux disease)   . Chronic stomach ulcer   . Nocturia   . Sleep difficulties   . Depression   . Anxiety   . Brain injury     1 1/2 YR AGO  . Concussion   . Diabetes mellitus   . Paroxysmal vertigo   . Stroke     crica 2002   Past Surgical History  Procedure Laterality Date  . Cholecystectomy  1984  . Spinal fusion  2006  . Eye surgery  2010    bilateral  . Abdominal hysterectomy     . Cerebral aneurysm repair    . Back surgery      SPINAL FUSION X6   . Incisional hernia repair  05/19/2011    Procedure: HERNIA REPAIR INCISIONAL;  Surgeon: Willey Blade, MD;  Location: [facility];  Service: General;  Laterality: N/A;  repair incisional hernia with mesh  . Hernia repair      09/21/2010  . Hernia repair  05/19/11    incisional hernia repair   . Appendectomy    . Tubal ligation     Family History  Problem Relation Age of Onset  . Breast cancer Mother   . Allergies Daughter   . Heart disease Father    History  Substance Use Topics  . Smoking status: Current Every Day Smoker -- 0.25 packs/day for 38 years    Types: Cigarettes  . Smokeless tobacco: Never Used  . Alcohol Use: No   OB History   Grav Para Term Preterm Abortions TAB SAB Ect Mult Living                 Review of Systems  All other systems reviewed and are negative.     Allergies  Cymbalta and Latex  Home Medications  Prior to Admission medications   Medication Sig Start Date End Date Taking? Authorizing Provider  ALPRAZolam Duanne Moron) 1 MG tablet Take 1 mg by mouth 3 (three) times daily as needed for anxiety.  10/01/13  Yes Historical Provider, MD  atorvastatin (LIPITOR) 40 MG tablet Take 40 mg by mouth daily.   Yes Historical Provider, MD  desvenlafaxine (PRISTIQ) 100 MG 24 hr tablet Take 100 mg by mouth every morning.    Yes Historical Provider, MD  esomeprazole (NEXIUM) 40 MG capsule Take 40 mg by mouth daily at 12 noon.   Yes Historical Provider, MD  estradiol (ESTRACE) 1 MG tablet Take 1 mg by mouth daily.   Yes Historical Provider, MD  fesoterodine (TOVIAZ) 4 MG TB24 tablet Take 4 mg by mouth daily.   Yes Historical Provider, MD  gabapentin (NEURONTIN) 600 MG tablet Take 600 mg by mouth 3 (three) times daily.   Yes Historical Provider, MD  ibuprofen (ADVIL,MOTRIN) 800 MG tablet Take 800 mg by mouth every 8 (eight) hours as needed for headache.   Yes Historical Provider, MD   imipramine (TOFRANIL) 50 MG tablet Take 50 mg by mouth at bedtime.    Yes Historical Provider, MD  insulin glargine (LANTUS) 100 UNIT/ML injection Inject 28 Units into the skin at bedtime.   Yes Historical Provider, MD  insulin lispro (HUMALOG) 100 UNIT/ML injection Inject 1-4 Units into the skin 3 (three) times daily before meals. Sliding scale   Yes Historical Provider, MD  lisinopril (PRINIVIL,ZESTRIL) 20 MG tablet Take 20 mg by mouth daily.  10/02/13  Yes Historical Provider, MD  lubiprostone (AMITIZA) 24 MCG capsule Take 24 mcg by mouth 2 (two) times daily with a meal.   Yes Historical Provider, MD  methocarbamol (ROBAXIN) 500 MG tablet Take 500 mg by mouth 3 (three) times daily as needed for muscle spasms.    Yes Historical Provider, MD  methylphenidate (RITALIN) 20 MG tablet Take 30 mg by mouth 2 (two) times daily.   Yes Historical Provider, MD  minocycline (DYNACIN) 100 MG tablet Take 100 mg by mouth 2 (two) times daily.   Yes Historical Provider, MD  olmesartan (BENICAR) 20 MG tablet Take 10 mg by mouth daily. One half daily 09/21/13  Yes Tanda Rockers, MD  oxyCODONE-acetaminophen (PERCOCET) 10-325 MG per tablet Take 1 tablet by mouth every 4 (four) hours as needed for pain.   Yes Historical Provider, MD  Oxymorphone ER, Crush Resist, (OPANA ER, CRUSH RESISTANT,) 15 MG T12A Take 15 mg by mouth every 12 (twelve) hours. 10/17/13  Yes Meredith Staggers, MD  promethazine (PHENERGAN) 25 MG tablet Take 25 mg by mouth every 6 (six) hours as needed for nausea.   Yes Historical Provider, MD  RESTASIS 0.05 % ophthalmic emulsion Place 1 drop into both eyes 2 (two) times daily.  10/08/13  Yes Historical Provider, MD  rivastigmine (EXELON) 3 MG capsule Take 1 capsule (3 mg total) by mouth 2 (two) times daily. 10/17/13  Yes Meredith Staggers, MD  rizatriptan (MAXALT) 10 MG tablet Take 10 mg by mouth as needed for migraine. May repeat in 2 hours if needed   Yes Historical Provider, MD  topiramate (TOPAMAX) 100  MG tablet Take 1.5 tablets (150 mg total) by mouth daily. 08/31/13  Yes Meredith Staggers, MD  torsemide (DEMADEX) 20 MG tablet Take 20 mg by mouth every morning.    Yes Historical Provider, MD  tretinoin (RETIN-A) 0.1 % cream Apply 1 application topically at bedtime.  Yes Historical Provider, MD  zolpidem (AMBIEN CR) 12.5 MG CR tablet Take 12.5 mg by mouth at bedtime as needed for sleep.   Yes Historical Provider, MD  isosorbide mononitrate (ISMO,MONOKET) 10 MG tablet Take 1 tablet (10 mg total) by mouth 3 (three) times daily. 10/31/13   Rhetta Mura, MD  nicotine (NICODERM CQ - DOSED IN MG/24 HOURS) 14 mg/24hr patch Place 1 patch (14 mg total) onto the skin daily. 10/31/13   Rhetta Mura, MD  nitroGLYCERIN (NITROLINGUAL) 0.4 MG/SPRAY spray Place 1 spray under the tongue every 5 (five) minutes x 3 doses as needed for chest pain. 10/31/13   Rhetta Mura, MD  pantoprazole (PROTONIX) 40 MG tablet Take 1 tablet (40 mg total) by mouth daily. 10/31/13   Rhetta Mura, MD  ranitidine (ZANTAC) 150 MG tablet Take 1 tablet (150 mg total) by mouth 2 (two) times daily. 10/31/13   Rhetta Mura, MD  scopolamine (TRANSDERM-SCOP) 1 MG/3DAYS Place 1 patch (1.5 mg total) onto the skin every 3 (three) days. 10/17/13   Ranelle Oyster, MD   BP 139/97  Pulse 73  Temp(Src) 97.9 F (36.6 C) (Oral)  Resp 17  SpO2 100% Physical Exam  Nursing note and vitals reviewed. Constitutional: She is oriented to person, place, and time. She appears well-developed and well-nourished.  HENT:  Head: Normocephalic and atraumatic.  Eyes: Conjunctivae and EOM are normal. Pupils are equal, round, and reactive to light.  Neck: Normal range of motion and phonation normal. Neck supple.  Cardiovascular: Normal rate, regular rhythm and intact distal pulses.   Blood pressure, 192/135 (automated) elevated, at 2150  Pulmonary/Chest: Effort normal and breath sounds normal. She exhibits no tenderness.  Abdominal:  Soft. She exhibits no distension. There is no tenderness. There is no guarding.  Musculoskeletal: Normal range of motion.  Neurological: She is alert and oriented to person, place, and time. She exhibits normal muscle tone.  Skin: Skin is warm and dry.  Psychiatric: Her behavior is normal. Judgment and thought content normal.  She is anxious    ED Course  Procedures (including critical care time) Medications  acetaminophen (TYLENOL) tablet 650 mg (650 mg Oral Given 11/02/13 0002)    Patient Vitals for the past 24 hrs:  BP Temp Temp src Pulse Resp SpO2  11/01/13 2328 139/97 mmHg - - 73 17 100 %  11/01/13 2216 157/96 mmHg - - - - -  11/01/13 2215 165/98 mmHg - - - - -  11/01/13 2200 143/92 mmHg - - 73 18 98 %  11/01/13 2145 156/88 mmHg - - 77 19 100 %  11/01/13 2115 174/134 mmHg - - 77 21 99 %  11/01/13 2045 135/72 mmHg - - 78 21 100 %  11/01/13 2031 155/84 mmHg - - 74 22 100 %  11/01/13 2030 155/84 mmHg - - - 22 -  11/01/13 1918 161/89 mmHg 97.9 F (36.6 C) Oral 81 20 100 %    12:03 AM Reevaluation with update and discussion. After initial assessment and treatment, an updated evaluation reveals no further complaints. Findings discussed with patient and family members. Flint Melter     Labs Review Labs Reviewed  CBC - Abnormal; Notable for the following:    Platelets 142 (*)    All other components within normal limits  BASIC METABOLIC PANEL - Abnormal; Notable for the following:    Potassium 3.6 (*)    Glucose, Bld 251 (*)    All other components within normal limits  Rosezena Sensor, ED  Imaging Review No results found.     Date: 11/01/13  Rate: 82  Rhythm: normal sinus rhythm  QRS Axis: normal  PR and QT Intervals: normal  ST/T Wave abnormalities: normal  PR and QRS Conduction Disutrbances:none  Narrative Interpretation:   Old EKG Reviewed: unchanged    EKG Interpretation None      MDM   Final diagnoses:  Chest pain    Nonspecific chest  pain, with negative EGD, evaluation. She has had negative cardiac echo and cardiac stress test within the last 6 months. She is scheduled for a cardiac, in 4 days. She has normal vital signs. There is no indication for further treatment. In the emergency department, or hospital setting, at this time   Nursing Notes Reviewed/ Care Coordinated Applicable Imaging Reviewed Interpretation of Laboratory Data incorporated into ED treatment  The patient appears reasonably screened and/or stabilized for discharge and I doubt any other medical condition or other [facility] requiring further screening, evaluation, or treatment in the ED at this time prior to discharge.  Plan: Home Medications- usual, plus OTC analgesic of choice; Home Treatments- rest, heat to sore areas; return here if the recommended treatment, does not improve the symptoms; Recommended follow up- cardiology and PCP, as scheduled    Richarda Blade, MD 11/02/13 [phone]

## 2013-11-01 NOTE — ED Notes (Addendum)
D/c yesterday for cp; saw dr. Einar Gip and told ekg was that ekg was bad and to come Monday on 11/05/13 for cardiac cath.  Having h/a's.

## 2013-11-02 DIAGNOSIS — K219 Gastro-esophageal reflux disease without esophagitis: Secondary | ICD-10-CM | POA: Diagnosis not present

## 2013-11-02 DIAGNOSIS — I10 Essential (primary) hypertension: Secondary | ICD-10-CM | POA: Diagnosis not present

## 2013-11-02 DIAGNOSIS — R079 Chest pain, unspecified: Secondary | ICD-10-CM | POA: Diagnosis not present

## 2013-11-05 ENCOUNTER — Encounter (HOSPITAL_COMMUNITY)
Admission: RE | Disposition: A | Discharge status: Home / Self Care | Payer: Self-pay | Source: Ambulatory Visit | Attending: Cardiology

## 2013-11-05 ENCOUNTER — Ambulatory Visit (HOSPITAL_COMMUNITY)
Admission: RE | Admit: 2013-11-05 | Discharge: 2013-11-05 | Disposition: A | Discharge status: Home / Self Care | Payer: Medicare Other | Source: Ambulatory Visit | Attending: Cardiology | Admitting: Cardiology

## 2013-11-05 ENCOUNTER — Encounter (HOSPITAL_COMMUNITY): Payer: Self-pay | Admitting: Pharmacy Technician

## 2013-11-05 DIAGNOSIS — I2510 Atherosclerotic heart disease of native coronary artery without angina pectoris: Secondary | ICD-10-CM | POA: Insufficient documentation

## 2013-11-05 DIAGNOSIS — E785 Hyperlipidemia, unspecified: Secondary | ICD-10-CM | POA: Diagnosis not present

## 2013-11-05 DIAGNOSIS — I679 Cerebrovascular disease, unspecified: Secondary | ICD-10-CM | POA: Insufficient documentation

## 2013-11-05 DIAGNOSIS — R0789 Other chest pain: Secondary | ICD-10-CM | POA: Diagnosis not present

## 2013-11-05 DIAGNOSIS — E119 Type 2 diabetes mellitus without complications: Secondary | ICD-10-CM | POA: Diagnosis not present

## 2013-11-05 DIAGNOSIS — I10 Essential (primary) hypertension: Secondary | ICD-10-CM | POA: Diagnosis not present

## 2013-11-05 DIAGNOSIS — R079 Chest pain, unspecified: Secondary | ICD-10-CM | POA: Diagnosis not present

## 2013-11-05 HISTORY — PX: LEFT HEART CATHETERIZATION WITH CORONARY ANGIOGRAM: SHX5451

## 2013-11-05 LAB — BASIC METABOLIC PANEL WITH GFR
BUN: 12 mg/dL (ref 6–23)
CO2: 23 meq/L (ref 19–32)
Calcium: 8.9 mg/dL (ref 8.4–10.5)
Chloride: 105 meq/L (ref 96–112)
Creatinine, Ser: 0.53 mg/dL (ref 0.50–1.10)
GFR calc Af Amer: >90 mL/min
GFR calc non Af Amer: >90 mL/min
Glucose, Bld: 176 mg/dL — ABNORMAL HIGH (ref 70–99)
Potassium: 3.6 meq/L — ABNORMAL LOW (ref 3.7–5.3)
Sodium: 143 meq/L (ref 137–147)

## 2013-11-05 LAB — PROTIME-INR
INR: 0.89 (ref 0.00–1.49)
Prothrombin Time: 11.9 s (ref 11.6–15.2)

## 2013-11-05 LAB — CBC
HCT: 40.9 % (ref 36.0–46.0)
Hemoglobin: 13.9 g/dL (ref 12.0–15.0)
MCH: 28.9 pg (ref 26.0–34.0)
MCHC: 34 g/dL (ref 30.0–36.0)
MCV: 85 fL (ref 78.0–100.0)
Platelets: 176 K/uL (ref 150–400)
RBC: 4.81 MIL/uL (ref 3.87–5.11)
RDW: 13.7 % (ref 11.5–15.5)
WBC: 9.3 K/uL (ref 4.0–10.5)

## 2013-11-05 LAB — GLUCOSE, CAPILLARY
Glucose-Capillary: 160 mg/dL — ABNORMAL HIGH (ref 70–99)
Glucose-Capillary: 230 mg/dL — ABNORMAL HIGH (ref 70–99)

## 2013-11-05 SURGERY — LEFT HEART CATHETERIZATION WITH CORONARY ANGIOGRAM
Anesthesia: LOCAL

## 2013-11-05 MED ORDER — ASPIRIN 81 MG PO CHEW
81.0000 mg | CHEWABLE_TABLET | ORAL | Status: AC
  Administered 2013-11-05: 81 mg via ORAL
  Filled 2013-11-05: qty 1

## 2013-11-05 MED ORDER — HEPARIN (PORCINE) IN NACL 2-0.9 UNIT/ML-% IJ SOLN
INTRAMUSCULAR | Status: AC
  Filled 2013-11-05: qty 1000

## 2013-11-05 MED ORDER — SODIUM CHLORIDE 0.9 % IV SOLN: INTRAVENOUS | Status: DC

## 2013-11-05 MED ORDER — SODIUM CHLORIDE 0.9 % IJ SOLN: 3.0000 mL | INTRAMUSCULAR | Status: DC | PRN

## 2013-11-05 MED ORDER — LIDOCAINE HCL (PF) 1 % IJ SOLN
INTRAMUSCULAR | Status: AC
  Filled 2013-11-05: qty 30

## 2013-11-05 MED ORDER — SODIUM CHLORIDE 0.9 % IJ SOLN: 3.0000 mL | Freq: Two times a day (BID) | INTRAMUSCULAR | Status: DC

## 2013-11-05 MED ORDER — MIDAZOLAM HCL 2 MG/2ML IJ SOLN
INTRAMUSCULAR | Status: AC
  Filled 2013-11-05: qty 2

## 2013-11-05 MED ORDER — HYDROMORPHONE HCL PF 1 MG/ML IJ SOLN
INTRAMUSCULAR | Status: AC
  Filled 2013-11-05: qty 1

## 2013-11-05 MED ORDER — NITROGLYCERIN 0.2 MG/ML ON CALL CATH LAB
INTRAVENOUS | Status: AC
  Filled 2013-11-05: qty 1

## 2013-11-05 MED ORDER — VERAPAMIL HCL 2.5 MG/ML IV SOLN
INTRAVENOUS | Status: AC
  Filled 2013-11-05: qty 2

## 2013-11-05 MED ORDER — SODIUM CHLORIDE 0.9 % IV SOLN: 250.0000 mL | INTRAVENOUS | Status: DC | PRN

## 2013-11-05 MED ORDER — HEPARIN SODIUM (PORCINE) 1000 UNIT/ML IJ SOLN
INTRAMUSCULAR | Status: AC
  Filled 2013-11-05: qty 1

## 2013-11-05 NOTE — Discharge Instructions (Signed)

## 2013-11-05 NOTE — Interval H&P Note (Signed)
History and Physical Interval Note:  11/05/2013 7:36 AM  Mallory Mendez  has presented today for surgery, with the diagnosis of cp  The various methods of treatment have been discussed with the patient and family. After consideration of risks, benefits and other options for treatment, the patient has consented to  Procedure(s): LEFT HEART CATHETERIZATION WITH CORONARY ANGIOGRAM (N/A) and possible PCI as a surgical intervention .  The patient's history has been reviewed, patient examined, no change in status, stable for surgery.  I have reviewed the patient's chart and labs.  Questions were answered to the patient's satisfaction.   Cath Lab Visit (complete for each Cath Lab visit)  Clinical Evaluation Leading to the Procedure:   ACS: no  Non-ACS:    Anginal Classification: CCS III  Anti-ischemic medical therapy: Minimal Therapy (1 class of medications)  Non-Invasive Test Results: Low-risk stress test findings: cardiac mortality <1%/year  Prior CABG: No previous CABG        Mallory Mendez

## 2013-11-05 NOTE — CV Procedure (Signed)
Procedure performed:  Left heart catheterization including hemodynamic monitoring of the left ventricle, LV gram, selective right and left coronary arteriography.  Indication patient is a 58 year-old female with history of hypertension,  hyperlipidemia, history of cerebrovascular disease and cerebral aneurysms, Diabetes Mellitus   who presents with recurrent chest discomfort. Patient has  had non invasive testing which was normal/low risk. However patient comes into the emergency room recurrently with chest pain. Due to recurrent admissions to the emergency room, patient is brought to the cardiac catheterization lab to evaluate the  coronary anatomy for definitive diagnosis of CAD.  Hemodynamic data:  Left ventricular pressure was 136/-3 with LVEDP of 13 mm mercury. Aortic pressure was 138/85 with a mean of 107 mm mercury. There was no pressure gradient across the aortic valve  Left ventricle: Performed in the RAO projection revealed LVEF of 65-70 %. There was no significant MR. no wall motion abnormality.  Right coronary artery: Dominant. Very large caliber vessel giving origin to a very large PDA and PL branch. Midsegment right coronary artery shows mild diffuse luminal irregularity.  Left main coronary artery is large and normal.  Circumflex coronary artery: A large vessel giving origin to a large obtuse marginal 1. Proximal OM1 has a mildly hazy 30-40% stenosis. Midsegment of the circumflex coronary artery shows a 50% stenosis, brisk flow is evident, no ulcerated lesion.   LAD:  LAD gives origin to a large diagonal-1.  LAD has mild luminal irregularities.   Impression: No significant coronary artery disease that can explain rest angina pectoris. She has moderate stenosis in the circumflex coronary artery, OM 1 and mid circumflex, however these did not give her rest pain and also the lesions do not appear to be unstable. Elevation for noncardiac causes of chest pain is indicated. Patient will  be discharged home today, outpatient followup, needs therapy for hypertension, continued risk factor modification and smoking cessation. 70 cc of contrast was utilized for diagnostic angiography.  Technique: Under sterile precautions using a 6 French right radial  arterial access, a 6 French sheath was introduced into the right radial artery. A 5 [place] Tig 4 catheter was advanced into the ascending aorta selective  right coronary artery and left coronary artery was cannulated and angiography was performed in multiple views. The catheter was pulled back Out of the body over exchange length J-wire.  Name Catheter was used to perform LV gram which was performed in RAO projection.  Catheter exchanged out of the body over J-Wire. NO immediate complications noted. Patient tolerated the procedure well.

## 2013-11-05 NOTE — H&P (Signed)
  Please see office visit notes for complete details of HPI.  

## 2013-11-14 ENCOUNTER — Other Ambulatory Visit: Payer: Self-pay | Admitting: Physical Medicine & Rehabilitation

## 2013-11-14 ENCOUNTER — Encounter: Payer: Medicare Other | Attending: Physical Medicine and Rehabilitation | Admitting: Registered Nurse

## 2013-11-14 ENCOUNTER — Other Ambulatory Visit: Payer: Self-pay

## 2013-11-14 ENCOUNTER — Encounter: Payer: Self-pay | Admitting: Registered Nurse

## 2013-11-14 VITALS — BP 153/87 | HR 107 | Resp 14 | Ht 64.0 in | Wt 164.0 lb

## 2013-11-14 DIAGNOSIS — F0781 Postconcussional syndrome: Secondary | ICD-10-CM | POA: Diagnosis not present

## 2013-11-14 DIAGNOSIS — F329 Major depressive disorder, single episode, unspecified: Secondary | ICD-10-CM

## 2013-11-14 DIAGNOSIS — F341 Dysthymic disorder: Secondary | ICD-10-CM | POA: Diagnosis not present

## 2013-11-14 DIAGNOSIS — I651 Occlusion and stenosis of basilar artery: Secondary | ICD-10-CM

## 2013-11-14 DIAGNOSIS — F32A Depression, unspecified: Secondary | ICD-10-CM

## 2013-11-14 DIAGNOSIS — Z5181 Encounter for therapeutic drug level monitoring: Secondary | ICD-10-CM | POA: Diagnosis not present

## 2013-11-14 DIAGNOSIS — M961 Postlaminectomy syndrome, not elsewhere classified: Secondary | ICD-10-CM | POA: Insufficient documentation

## 2013-11-14 DIAGNOSIS — Z79899 Other long term (current) drug therapy: Secondary | ICD-10-CM | POA: Diagnosis not present

## 2013-11-14 DIAGNOSIS — F419 Anxiety disorder, unspecified: Secondary | ICD-10-CM

## 2013-11-14 MED ORDER — METHYLPHENIDATE HCL 20 MG PO TABS: 30.0000 mg | ORAL_TABLET | Freq: Two times a day (BID) | ORAL | Status: DC

## 2013-11-14 NOTE — Progress Notes (Signed)
Subjective:    Patient ID: Mallory Mendez, female    DOB: 1956-03-15, 58 y.o.   MRN: [id number]  HPI: [name] is a 58 year old female who returns for follow up for chronic pain and medication refill. She says her pain is located in her middle back and has been having frequent headaches. Her current exercise regime is walking in her home. She arrived to clinic with cadillac walker. She says she usually uses her wheelchair at home. She only uses the cadillac walker when she is with someone.  Pain Inventory Average Pain 6 Pain Right Now 6 My pain is aching  In the last 24 hours, has pain interfered with the following? General activity 7 Relation with others 5 Enjoyment of life 5 What TIME of day is your pain at its worst? evening Sleep (in general) Fair  Pain is worse with: walking, bending, standing and some activites Pain improves with: rest and medication Relief from Meds: 8  Mobility walk with assistance how many minutes can you walk? 5 ability to climb steps?  no do you drive?  no  Function retired I need assistance with the following:  feeding, dressing, bathing, meal prep, household duties and shopping  Neuro/Psych bladder control problems weakness numbness tremor tingling trouble walking dizziness confusion depression anxiety  Prior Studies Any changes since last visit?  yes hospital  Physicians involved in your care Any changes since last visit?  no   Family History  Problem Relation Age of Onset  . Breast cancer Mother   . Allergies Daughter   . Heart disease Father    History   Social History  . Marital Status: Married    Spouse Name: N/A    Number of Children: N/A  . Years of Education: N/A   Occupational History  . disabled    Social History Main Topics  . Smoking status: Current Every Day Smoker -- 0.25 packs/day for 38 years    Types: Cigarettes  . Smokeless tobacco: Never Used  . Alcohol Use: No  . Drug Use: No    . Sexual Activity: None   Other Topics Concern  . None   Social History Narrative  . None   Past Surgical History  Procedure Laterality Date  . Cholecystectomy  1984  . Spinal fusion  2006  . Eye surgery  2010    bilateral  . Abdominal hysterectomy    . Cerebral aneurysm repair    . Back surgery      SPINAL FUSION X6   . Incisional hernia repair  05/19/2011    Procedure: HERNIA REPAIR INCISIONAL;  Surgeon: Willey Blade, MD;  Location: [facility];  Service: General;  Laterality: N/A;  repair incisional hernia with mesh  . Hernia repair      09/21/2010  . Hernia repair  05/19/11    incisional hernia repair   . Appendectomy    . Tubal ligation     Past Medical History  Diagnosis Date  . Diabetes mellitus   . Hyperlipidemia   . Blood transfusion   . Arthritis   . History of back surgery     multiple  . Hypertension   . Hernia     incisional  . Wears glasses   . MRSA (methicillin resistant Staphylococcus aureus)   . Full dentures   . Chronic constipation   . Umbilical hernia   . Headache(784.0)   . Dizziness   . Concussion     HAS  HAD DIZZINESS/ MEMORY PROBLEMS/ SLOW SPEACH /BALANCE PROBLEMS SINCE CONCUSSION 1 1/2 YR AGO PR GOES TO THERAPY  . Fibromyalgia   . Scar   . GERD (gastroesophageal reflux disease)   . Chronic stomach ulcer   . Nocturia   . Sleep difficulties   . Depression   . Anxiety   . Brain injury     1 1/2 YR AGO  . Concussion   . Diabetes mellitus   . Paroxysmal vertigo   . Stroke     crica 2002   BP 153/87  Pulse 107  Resp 14  Ht 5\' 4"  (1.626 m)  Wt 164 lb (74.39 kg)  BMI 28.14 kg/m2  SpO2 98%  Opioid Risk Score:   Fall Risk Score: High Fall Risk (>13 points) (educated and handout given at previous visit for fall prevention in the home)  Review of Systems  Constitutional: Positive for appetite change.  Cardiovascular: Positive for leg swelling.  Gastrointestinal: Positive for nausea, vomiting and constipation.   Genitourinary:       Bladder control problems  Musculoskeletal: Positive for gait problem.  Neurological: Positive for dizziness, tremors, weakness and numbness.       Tingling  Psychiatric/Behavioral: Positive for confusion and dysphoric mood. The patient is nervous/anxious.   All other systems reviewed and are negative.      Objective:   Physical Exam  Nursing note and vitals reviewed. Constitutional: She is oriented to person, place, and time. She appears well-developed and well-nourished.  HENT:  Head: Normocephalic and atraumatic.  Neck: Normal range of motion. Neck supple.  Cardiovascular: Normal rate, regular rhythm and normal heart sounds.   Musculoskeletal:  Normal Muscle Bulk: Muscle testing Reveals: Upper Extremities: Right arm Full ROM and Muscle Strength 5/5. Left Arm: Decreased ROM Muscle strength 3/5/ Thoracic Paraspinal Tenderness: T-5- T-8 Lumbar Paraspinal Tenderness L-4- L-5 Lower Extremities: Right Leg Full ROM and Muscle Strength 5/5/ Left leg: Muscle Strength 3/5. Full ROM Walks with cadillac Walker Arises from chair with ease  Neurological: She is alert and oriented to person, place, and time.  Skin: Skin is warm and dry.  Psychiatric: She has a normal mood and affect.          Assessment & Plan:  1. Post Concussion Syndrome/ Attention Deficit:  Refilled: Ritalin 20 mg take 1.5 tablet by mouth BID #90 2. Chronic Low Back Pain Pain: Continue Current Medication Regime 3. Headaches: Continue Topamax  20 minutes of face to face patient care time was spent during this visit. All questions were encouraged and answered.  F/U in 2 months

## 2013-11-19 DIAGNOSIS — Z9889 Other specified postprocedural states: Secondary | ICD-10-CM | POA: Diagnosis not present

## 2013-11-19 DIAGNOSIS — E785 Hyperlipidemia, unspecified: Secondary | ICD-10-CM | POA: Diagnosis not present

## 2013-11-19 DIAGNOSIS — R072 Precordial pain: Secondary | ICD-10-CM | POA: Diagnosis not present

## 2013-11-19 DIAGNOSIS — I2510 Atherosclerotic heart disease of native coronary artery without angina pectoris: Secondary | ICD-10-CM | POA: Diagnosis not present

## 2013-11-21 DIAGNOSIS — M545 Low back pain, unspecified: Secondary | ICD-10-CM | POA: Diagnosis not present

## 2013-11-21 DIAGNOSIS — Z79899 Other long term (current) drug therapy: Secondary | ICD-10-CM | POA: Diagnosis not present

## 2013-11-21 DIAGNOSIS — G894 Chronic pain syndrome: Secondary | ICD-10-CM | POA: Diagnosis not present

## 2013-11-21 DIAGNOSIS — M25519 Pain in unspecified shoulder: Secondary | ICD-10-CM | POA: Diagnosis not present

## 2013-11-30 DIAGNOSIS — E1049 Type 1 diabetes mellitus with other diabetic neurological complication: Secondary | ICD-10-CM | POA: Diagnosis not present

## 2013-11-30 DIAGNOSIS — E1142 Type 2 diabetes mellitus with diabetic polyneuropathy: Secondary | ICD-10-CM | POA: Diagnosis not present

## 2013-11-30 DIAGNOSIS — K219 Gastro-esophageal reflux disease without esophagitis: Secondary | ICD-10-CM | POA: Diagnosis not present

## 2013-11-30 DIAGNOSIS — F411 Generalized anxiety disorder: Secondary | ICD-10-CM | POA: Diagnosis not present

## 2013-11-30 DIAGNOSIS — R51 Headache: Secondary | ICD-10-CM | POA: Diagnosis not present

## 2013-11-30 DIAGNOSIS — I10 Essential (primary) hypertension: Secondary | ICD-10-CM | POA: Diagnosis not present

## 2013-11-30 DIAGNOSIS — E785 Hyperlipidemia, unspecified: Secondary | ICD-10-CM | POA: Diagnosis not present

## 2013-11-30 DIAGNOSIS — F17200 Nicotine dependence, unspecified, uncomplicated: Secondary | ICD-10-CM | POA: Diagnosis not present

## 2013-11-30 NOTE — Progress Notes (Signed)
Urine drug screen from 11/14/2013 was consistent.

## 2013-12-20 DIAGNOSIS — M545 Low back pain, unspecified: Secondary | ICD-10-CM | POA: Diagnosis not present

## 2013-12-20 DIAGNOSIS — G894 Chronic pain syndrome: Secondary | ICD-10-CM | POA: Diagnosis not present

## 2013-12-20 DIAGNOSIS — Z79899 Other long term (current) drug therapy: Secondary | ICD-10-CM | POA: Diagnosis not present

## 2013-12-20 DIAGNOSIS — M25519 Pain in unspecified shoulder: Secondary | ICD-10-CM | POA: Diagnosis not present

## 2014-01-02 ENCOUNTER — Other Ambulatory Visit: Payer: Self-pay | Admitting: Physical Medicine & Rehabilitation

## 2014-01-14 ENCOUNTER — Encounter: Payer: Medicare Other | Attending: Physical Medicine and Rehabilitation | Admitting: Registered Nurse

## 2014-01-14 DIAGNOSIS — F341 Dysthymic disorder: Secondary | ICD-10-CM | POA: Insufficient documentation

## 2014-01-14 DIAGNOSIS — F0781 Postconcussional syndrome: Secondary | ICD-10-CM | POA: Insufficient documentation

## 2014-01-14 DIAGNOSIS — Z5181 Encounter for therapeutic drug level monitoring: Secondary | ICD-10-CM | POA: Insufficient documentation

## 2014-01-14 DIAGNOSIS — M961 Postlaminectomy syndrome, not elsewhere classified: Secondary | ICD-10-CM | POA: Insufficient documentation

## 2014-01-25 ENCOUNTER — Encounter: Payer: Medicare Other | Admitting: Registered Nurse

## 2014-01-29 DIAGNOSIS — L708 Other acne: Secondary | ICD-10-CM | POA: Diagnosis not present

## 2014-01-30 ENCOUNTER — Telehealth: Payer: Self-pay | Admitting: Physical Medicine & Rehabilitation

## 2014-01-30 NOTE — Telephone Encounter (Signed)
PATIENTS SON CALLED WANTING TO RESCHEDULE APPT, ADVISED DR KGSUPJ NEEDS TO APPROVE HER CONTINUING HERE. ADVSD HE WILL GET A CALL WHEN DECISION IS MADE...JMN

## 2014-01-31 ENCOUNTER — Encounter: Payer: Self-pay | Admitting: *Deleted

## 2014-02-18 DIAGNOSIS — Z79899 Other long term (current) drug therapy: Secondary | ICD-10-CM | POA: Diagnosis not present

## 2014-02-18 DIAGNOSIS — M545 Low back pain, unspecified: Secondary | ICD-10-CM | POA: Diagnosis not present

## 2014-02-18 DIAGNOSIS — G894 Chronic pain syndrome: Secondary | ICD-10-CM | POA: Diagnosis not present

## 2014-02-18 DIAGNOSIS — M25519 Pain in unspecified shoulder: Secondary | ICD-10-CM | POA: Diagnosis not present

## 2014-04-05 DIAGNOSIS — E1042 Type 1 diabetes mellitus with diabetic polyneuropathy: Secondary | ICD-10-CM | POA: Diagnosis not present

## 2014-04-05 DIAGNOSIS — I10 Essential (primary) hypertension: Secondary | ICD-10-CM | POA: Diagnosis not present

## 2014-04-05 DIAGNOSIS — F0781 Postconcussional syndrome: Secondary | ICD-10-CM | POA: Diagnosis not present

## 2014-04-05 DIAGNOSIS — I671 Cerebral aneurysm, nonruptured: Secondary | ICD-10-CM | POA: Diagnosis not present

## 2014-04-05 DIAGNOSIS — K3184 Gastroparesis: Secondary | ICD-10-CM | POA: Diagnosis not present

## 2014-04-05 DIAGNOSIS — M797 Fibromyalgia: Secondary | ICD-10-CM | POA: Diagnosis not present

## 2014-04-05 DIAGNOSIS — G609 Hereditary and idiopathic neuropathy, unspecified: Secondary | ICD-10-CM | POA: Diagnosis not present

## 2014-04-05 DIAGNOSIS — M25512 Pain in left shoulder: Secondary | ICD-10-CM | POA: Diagnosis not present

## 2014-04-11 DIAGNOSIS — M7502 Adhesive capsulitis of left shoulder: Secondary | ICD-10-CM | POA: Diagnosis not present

## 2014-04-23 DIAGNOSIS — M25519 Pain in unspecified shoulder: Secondary | ICD-10-CM | POA: Diagnosis not present

## 2014-04-23 DIAGNOSIS — G894 Chronic pain syndrome: Secondary | ICD-10-CM | POA: Diagnosis not present

## 2014-04-23 DIAGNOSIS — M545 Low back pain: Secondary | ICD-10-CM | POA: Diagnosis not present

## 2014-04-23 DIAGNOSIS — Z79899 Other long term (current) drug therapy: Secondary | ICD-10-CM | POA: Diagnosis not present

## 2014-05-22 DIAGNOSIS — M545 Low back pain: Secondary | ICD-10-CM | POA: Diagnosis not present

## 2014-05-22 DIAGNOSIS — G4459 Other complicated headache syndrome: Secondary | ICD-10-CM | POA: Diagnosis not present

## 2014-05-22 DIAGNOSIS — M25519 Pain in unspecified shoulder: Secondary | ICD-10-CM | POA: Diagnosis not present

## 2014-05-22 DIAGNOSIS — G4701 Insomnia due to medical condition: Secondary | ICD-10-CM | POA: Diagnosis not present

## 2014-05-22 DIAGNOSIS — Z79899 Other long term (current) drug therapy: Secondary | ICD-10-CM | POA: Diagnosis not present

## 2014-06-03 DIAGNOSIS — M545 Low back pain: Secondary | ICD-10-CM | POA: Diagnosis not present

## 2014-06-03 DIAGNOSIS — E785 Hyperlipidemia, unspecified: Secondary | ICD-10-CM | POA: Diagnosis not present

## 2014-06-03 DIAGNOSIS — E1042 Type 1 diabetes mellitus with diabetic polyneuropathy: Secondary | ICD-10-CM | POA: Diagnosis not present

## 2014-06-03 DIAGNOSIS — K219 Gastro-esophageal reflux disease without esophagitis: Secondary | ICD-10-CM | POA: Diagnosis not present

## 2014-06-03 DIAGNOSIS — I10 Essential (primary) hypertension: Secondary | ICD-10-CM | POA: Diagnosis not present

## 2014-06-13 ENCOUNTER — Encounter (HOSPITAL_COMMUNITY): Payer: Self-pay | Admitting: Cardiology

## 2014-06-18 DIAGNOSIS — M5386 Other specified dorsopathies, lumbar region: Secondary | ICD-10-CM | POA: Diagnosis not present

## 2014-06-18 DIAGNOSIS — Z79899 Other long term (current) drug therapy: Secondary | ICD-10-CM | POA: Diagnosis not present

## 2014-06-18 DIAGNOSIS — G894 Chronic pain syndrome: Secondary | ICD-10-CM | POA: Diagnosis not present

## 2014-06-18 DIAGNOSIS — M961 Postlaminectomy syndrome, not elsewhere classified: Secondary | ICD-10-CM | POA: Diagnosis not present

## 2014-06-18 DIAGNOSIS — Z79891 Long term (current) use of opiate analgesic: Secondary | ICD-10-CM | POA: Diagnosis not present

## 2014-07-03 DIAGNOSIS — M5137 Other intervertebral disc degeneration, lumbosacral region: Secondary | ICD-10-CM | POA: Diagnosis not present

## 2014-07-03 DIAGNOSIS — Z79891 Long term (current) use of opiate analgesic: Secondary | ICD-10-CM | POA: Diagnosis not present

## 2014-07-03 DIAGNOSIS — Z79899 Other long term (current) drug therapy: Secondary | ICD-10-CM | POA: Diagnosis not present

## 2014-07-03 DIAGNOSIS — G894 Chronic pain syndrome: Secondary | ICD-10-CM | POA: Diagnosis not present

## 2014-07-16 DIAGNOSIS — M47817 Spondylosis without myelopathy or radiculopathy, lumbosacral region: Secondary | ICD-10-CM | POA: Diagnosis not present

## 2014-07-16 DIAGNOSIS — M488X6 Other specified spondylopathies, lumbar region: Secondary | ICD-10-CM | POA: Diagnosis not present

## 2014-07-16 DIAGNOSIS — M5137 Other intervertebral disc degeneration, lumbosacral region: Secondary | ICD-10-CM | POA: Diagnosis not present

## 2014-07-16 DIAGNOSIS — G894 Chronic pain syndrome: Secondary | ICD-10-CM | POA: Diagnosis not present

## 2014-07-17 DIAGNOSIS — M5441 Lumbago with sciatica, right side: Secondary | ICD-10-CM | POA: Diagnosis not present

## 2014-07-18 ENCOUNTER — Other Ambulatory Visit: Payer: Self-pay | Admitting: Specialist

## 2014-07-18 DIAGNOSIS — L281 Prurigo nodularis: Secondary | ICD-10-CM | POA: Diagnosis not present

## 2014-07-18 DIAGNOSIS — M545 Low back pain: Secondary | ICD-10-CM

## 2014-08-01 DIAGNOSIS — M47817 Spondylosis without myelopathy or radiculopathy, lumbosacral region: Secondary | ICD-10-CM | POA: Diagnosis not present

## 2014-08-01 DIAGNOSIS — G894 Chronic pain syndrome: Secondary | ICD-10-CM | POA: Diagnosis not present

## 2014-08-01 DIAGNOSIS — Z79899 Other long term (current) drug therapy: Secondary | ICD-10-CM | POA: Diagnosis not present

## 2014-08-01 DIAGNOSIS — M961 Postlaminectomy syndrome, not elsewhere classified: Secondary | ICD-10-CM | POA: Diagnosis not present

## 2014-08-01 DIAGNOSIS — M488X6 Other specified spondylopathies, lumbar region: Secondary | ICD-10-CM | POA: Diagnosis not present

## 2014-08-01 DIAGNOSIS — M5137 Other intervertebral disc degeneration, lumbosacral region: Secondary | ICD-10-CM | POA: Diagnosis not present

## 2014-08-12 ENCOUNTER — Other Ambulatory Visit: Payer: Medicare Other

## 2014-08-13 DIAGNOSIS — Z79899 Other long term (current) drug therapy: Secondary | ICD-10-CM | POA: Diagnosis not present

## 2014-08-13 DIAGNOSIS — M47817 Spondylosis without myelopathy or radiculopathy, lumbosacral region: Secondary | ICD-10-CM | POA: Diagnosis not present

## 2014-08-13 DIAGNOSIS — M5137 Other intervertebral disc degeneration, lumbosacral region: Secondary | ICD-10-CM | POA: Diagnosis not present

## 2014-08-13 DIAGNOSIS — G894 Chronic pain syndrome: Secondary | ICD-10-CM | POA: Diagnosis not present

## 2014-08-13 DIAGNOSIS — M961 Postlaminectomy syndrome, not elsewhere classified: Secondary | ICD-10-CM | POA: Diagnosis not present

## 2014-08-23 DIAGNOSIS — L700 Acne vulgaris: Secondary | ICD-10-CM | POA: Diagnosis not present

## 2014-08-27 ENCOUNTER — Ambulatory Visit
Admission: RE | Admit: 2014-08-27 | Discharge: 2014-08-27 | Disposition: A | Discharge status: Home / Self Care | Payer: Medicare Other | Source: Ambulatory Visit | Attending: Specialist | Admitting: Specialist

## 2014-08-27 DIAGNOSIS — M4806 Spinal stenosis, lumbar region: Secondary | ICD-10-CM | POA: Diagnosis not present

## 2014-08-27 DIAGNOSIS — M545 Low back pain: Secondary | ICD-10-CM

## 2014-08-27 DIAGNOSIS — M5125 Other intervertebral disc displacement, thoracolumbar region: Secondary | ICD-10-CM | POA: Diagnosis not present

## 2014-08-27 DIAGNOSIS — I10 Essential (primary) hypertension: Secondary | ICD-10-CM | POA: Diagnosis not present

## 2014-08-27 DIAGNOSIS — F17209 Nicotine dependence, unspecified, with unspecified nicotine-induced disorders: Secondary | ICD-10-CM | POA: Diagnosis not present

## 2014-08-27 MED ORDER — GADOBENATE DIMEGLUMINE 529 MG/ML IV SOLN
15.0000 mL | Freq: Once | INTRAVENOUS | Status: AC | PRN
Start: 2014-08-27 — End: 2014-08-27
  Administered 2014-08-27: 15 mL via INTRAVENOUS

## 2014-08-30 ENCOUNTER — Other Ambulatory Visit: Payer: Self-pay | Admitting: Specialist

## 2014-08-30 DIAGNOSIS — M546 Pain in thoracic spine: Secondary | ICD-10-CM

## 2014-09-02 DIAGNOSIS — E1042 Type 1 diabetes mellitus with diabetic polyneuropathy: Secondary | ICD-10-CM | POA: Diagnosis not present

## 2014-09-02 DIAGNOSIS — K219 Gastro-esophageal reflux disease without esophagitis: Secondary | ICD-10-CM | POA: Diagnosis not present

## 2014-09-02 DIAGNOSIS — M545 Low back pain: Secondary | ICD-10-CM | POA: Diagnosis not present

## 2014-09-02 DIAGNOSIS — I10 Essential (primary) hypertension: Secondary | ICD-10-CM | POA: Diagnosis not present

## 2014-09-02 DIAGNOSIS — F0781 Postconcussional syndrome: Secondary | ICD-10-CM | POA: Diagnosis not present

## 2014-09-02 DIAGNOSIS — F419 Anxiety disorder, unspecified: Secondary | ICD-10-CM | POA: Diagnosis not present

## 2014-09-10 DIAGNOSIS — G894 Chronic pain syndrome: Secondary | ICD-10-CM | POA: Diagnosis not present

## 2014-09-10 DIAGNOSIS — M5137 Other intervertebral disc degeneration, lumbosacral region: Secondary | ICD-10-CM | POA: Diagnosis not present

## 2014-09-10 DIAGNOSIS — Z79899 Other long term (current) drug therapy: Secondary | ICD-10-CM | POA: Diagnosis not present

## 2014-09-10 DIAGNOSIS — M961 Postlaminectomy syndrome, not elsewhere classified: Secondary | ICD-10-CM | POA: Diagnosis not present

## 2014-09-10 DIAGNOSIS — Z79891 Long term (current) use of opiate analgesic: Secondary | ICD-10-CM | POA: Diagnosis not present

## 2014-09-21 ENCOUNTER — Ambulatory Visit
Admission: RE | Admit: 2014-09-21 | Discharge: 2014-09-21 | Disposition: A | Discharge status: Home / Self Care | Payer: Medicare Other | Source: Ambulatory Visit | Attending: Specialist | Admitting: Specialist

## 2014-09-21 DIAGNOSIS — M546 Pain in thoracic spine: Secondary | ICD-10-CM

## 2014-09-21 DIAGNOSIS — M5124 Other intervertebral disc displacement, thoracic region: Secondary | ICD-10-CM | POA: Diagnosis not present

## 2014-09-30 DIAGNOSIS — M5441 Lumbago with sciatica, right side: Secondary | ICD-10-CM | POA: Diagnosis not present

## 2014-09-30 DIAGNOSIS — M546 Pain in thoracic spine: Secondary | ICD-10-CM | POA: Diagnosis not present

## 2014-10-08 DIAGNOSIS — Z79899 Other long term (current) drug therapy: Secondary | ICD-10-CM | POA: Diagnosis not present

## 2014-10-08 DIAGNOSIS — M961 Postlaminectomy syndrome, not elsewhere classified: Secondary | ICD-10-CM | POA: Diagnosis not present

## 2014-10-08 DIAGNOSIS — Z79891 Long term (current) use of opiate analgesic: Secondary | ICD-10-CM | POA: Diagnosis not present

## 2014-10-08 DIAGNOSIS — M5134 Other intervertebral disc degeneration, thoracic region: Secondary | ICD-10-CM | POA: Diagnosis not present

## 2014-10-08 DIAGNOSIS — G894 Chronic pain syndrome: Secondary | ICD-10-CM | POA: Diagnosis not present

## 2014-10-14 ENCOUNTER — Other Ambulatory Visit: Payer: Self-pay | Admitting: Orthopedic Surgery

## 2014-10-14 DIAGNOSIS — M25512 Pain in left shoulder: Secondary | ICD-10-CM | POA: Diagnosis not present

## 2014-10-21 ENCOUNTER — Ambulatory Visit
Admission: RE | Admit: 2014-10-21 | Discharge: 2014-10-21 | Disposition: A | Discharge status: Home / Self Care | Payer: Medicare Other | Source: Ambulatory Visit | Attending: Orthopedic Surgery | Admitting: Orthopedic Surgery

## 2014-10-21 DIAGNOSIS — M25512 Pain in left shoulder: Secondary | ICD-10-CM

## 2014-10-21 DIAGNOSIS — M19012 Primary osteoarthritis, left shoulder: Secondary | ICD-10-CM | POA: Diagnosis not present

## 2014-10-21 DIAGNOSIS — M7582 Other shoulder lesions, left shoulder: Secondary | ICD-10-CM | POA: Diagnosis not present

## 2014-10-22 ENCOUNTER — Other Ambulatory Visit (HOSPITAL_COMMUNITY): Payer: Self-pay | Admitting: Family Medicine

## 2014-10-22 DIAGNOSIS — Z1231 Encounter for screening mammogram for malignant neoplasm of breast: Secondary | ICD-10-CM

## 2014-10-28 ENCOUNTER — Other Ambulatory Visit (HOSPITAL_COMMUNITY): Payer: Self-pay | Admitting: Interventional Radiology

## 2014-10-28 DIAGNOSIS — R51 Headache: Secondary | ICD-10-CM

## 2014-10-28 DIAGNOSIS — I729 Aneurysm of unspecified site: Secondary | ICD-10-CM

## 2014-10-28 DIAGNOSIS — I671 Cerebral aneurysm, nonruptured: Secondary | ICD-10-CM

## 2014-10-28 DIAGNOSIS — R519 Headache, unspecified: Secondary | ICD-10-CM

## 2014-10-31 DIAGNOSIS — M7502 Adhesive capsulitis of left shoulder: Secondary | ICD-10-CM | POA: Diagnosis not present

## 2014-10-31 DIAGNOSIS — M25612 Stiffness of left shoulder, not elsewhere classified: Secondary | ICD-10-CM | POA: Diagnosis not present

## 2014-10-31 DIAGNOSIS — M25512 Pain in left shoulder: Secondary | ICD-10-CM | POA: Diagnosis not present

## 2014-11-05 ENCOUNTER — Ambulatory Visit (HOSPITAL_COMMUNITY): Payer: Medicare Other

## 2014-11-05 ENCOUNTER — Ambulatory Visit (HOSPITAL_COMMUNITY)
Admission: RE | Admit: 2014-11-05 | Discharge: 2014-11-05 | Disposition: A | Discharge status: Home / Self Care | Payer: Medicare Other | Source: Ambulatory Visit | Attending: Interventional Radiology | Admitting: Interventional Radiology

## 2014-11-05 ENCOUNTER — Ambulatory Visit (HOSPITAL_COMMUNITY): Admission: RE | Admit: 2014-11-05 | Payer: Medicare Other | Source: Ambulatory Visit

## 2014-11-05 DIAGNOSIS — G894 Chronic pain syndrome: Secondary | ICD-10-CM | POA: Diagnosis not present

## 2014-11-05 DIAGNOSIS — Z9889 Other specified postprocedural states: Secondary | ICD-10-CM | POA: Insufficient documentation

## 2014-11-05 DIAGNOSIS — M961 Postlaminectomy syndrome, not elsewhere classified: Secondary | ICD-10-CM | POA: Diagnosis not present

## 2014-11-05 DIAGNOSIS — R51 Headache: Secondary | ICD-10-CM | POA: Diagnosis not present

## 2014-11-05 DIAGNOSIS — Z79891 Long term (current) use of opiate analgesic: Secondary | ICD-10-CM | POA: Diagnosis not present

## 2014-11-05 DIAGNOSIS — Z79899 Other long term (current) drug therapy: Secondary | ICD-10-CM | POA: Diagnosis not present

## 2014-11-05 DIAGNOSIS — M5134 Other intervertebral disc degeneration, thoracic region: Secondary | ICD-10-CM | POA: Diagnosis not present

## 2014-11-05 DIAGNOSIS — R519 Headache, unspecified: Secondary | ICD-10-CM

## 2014-11-05 DIAGNOSIS — I998 Other disorder of circulatory system: Secondary | ICD-10-CM | POA: Diagnosis not present

## 2014-11-05 DIAGNOSIS — I671 Cerebral aneurysm, nonruptured: Secondary | ICD-10-CM | POA: Diagnosis not present

## 2014-11-05 LAB — POCT I-STAT CREATININE: Creatinine, Ser: 0.7 mg/dL (ref 0.44–1.00)

## 2014-11-05 MED ORDER — GADOBENATE DIMEGLUMINE 529 MG/ML IV SOLN
15.0000 mL | Freq: Once | INTRAVENOUS | Status: AC | PRN
  Administered 2014-11-05: 13 mL via INTRAVENOUS

## 2014-11-13 ENCOUNTER — Ambulatory Visit (HOSPITAL_COMMUNITY): Payer: Medicare Other

## 2014-11-14 DIAGNOSIS — M7502 Adhesive capsulitis of left shoulder: Secondary | ICD-10-CM | POA: Diagnosis not present

## 2014-11-14 DIAGNOSIS — M25612 Stiffness of left shoulder, not elsewhere classified: Secondary | ICD-10-CM | POA: Diagnosis not present

## 2014-11-14 DIAGNOSIS — M25512 Pain in left shoulder: Secondary | ICD-10-CM | POA: Diagnosis not present

## 2014-12-04 DIAGNOSIS — M542 Cervicalgia: Secondary | ICD-10-CM | POA: Diagnosis not present

## 2014-12-04 DIAGNOSIS — Z79899 Other long term (current) drug therapy: Secondary | ICD-10-CM | POA: Diagnosis not present

## 2014-12-04 DIAGNOSIS — M47817 Spondylosis without myelopathy or radiculopathy, lumbosacral region: Secondary | ICD-10-CM | POA: Diagnosis not present

## 2014-12-04 DIAGNOSIS — M5137 Other intervertebral disc degeneration, lumbosacral region: Secondary | ICD-10-CM | POA: Diagnosis not present

## 2014-12-04 DIAGNOSIS — M961 Postlaminectomy syndrome, not elsewhere classified: Secondary | ICD-10-CM | POA: Diagnosis not present

## 2014-12-04 DIAGNOSIS — G894 Chronic pain syndrome: Secondary | ICD-10-CM | POA: Diagnosis not present

## 2014-12-12 DIAGNOSIS — M79609 Pain in unspecified limb: Secondary | ICD-10-CM | POA: Diagnosis not present

## 2014-12-12 DIAGNOSIS — M542 Cervicalgia: Secondary | ICD-10-CM | POA: Diagnosis not present

## 2014-12-13 ENCOUNTER — Other Ambulatory Visit: Payer: Self-pay | Admitting: Anesthesiology

## 2014-12-13 DIAGNOSIS — M47817 Spondylosis without myelopathy or radiculopathy, lumbosacral region: Secondary | ICD-10-CM

## 2014-12-13 DIAGNOSIS — M4686 Other specified inflammatory spondylopathies, lumbar region: Secondary | ICD-10-CM

## 2014-12-13 DIAGNOSIS — M961 Postlaminectomy syndrome, not elsewhere classified: Secondary | ICD-10-CM

## 2014-12-16 ENCOUNTER — Other Ambulatory Visit: Payer: Self-pay | Admitting: Anesthesiology

## 2014-12-16 ENCOUNTER — Ambulatory Visit
Admission: RE | Admit: 2014-12-16 | Discharge: 2014-12-16 | Disposition: A | Discharge status: Home / Self Care | Payer: Medicare Other | Source: Ambulatory Visit | Attending: Anesthesiology | Admitting: Anesthesiology

## 2014-12-16 DIAGNOSIS — M542 Cervicalgia: Secondary | ICD-10-CM

## 2014-12-16 DIAGNOSIS — M5022 Other cervical disc displacement, mid-cervical region: Secondary | ICD-10-CM | POA: Diagnosis not present

## 2014-12-16 DIAGNOSIS — M47813 Spondylosis without myelopathy or radiculopathy, cervicothoracic region: Secondary | ICD-10-CM | POA: Diagnosis not present

## 2014-12-19 DIAGNOSIS — G5622 Lesion of ulnar nerve, left upper limb: Secondary | ICD-10-CM | POA: Diagnosis not present

## 2014-12-19 DIAGNOSIS — G5612 Other lesions of median nerve, left upper limb: Secondary | ICD-10-CM | POA: Diagnosis not present

## 2014-12-27 ENCOUNTER — Telehealth (HOSPITAL_COMMUNITY): Payer: Self-pay | Admitting: Interventional Radiology

## 2014-12-27 NOTE — Telephone Encounter (Signed)
Called pt, left VM that per Deveshwar next f/u would be due in November and he recommends catheter angiogram. JM

## 2015-01-02 DIAGNOSIS — G894 Chronic pain syndrome: Secondary | ICD-10-CM | POA: Diagnosis not present

## 2015-01-02 DIAGNOSIS — Z79899 Other long term (current) drug therapy: Secondary | ICD-10-CM | POA: Diagnosis not present

## 2015-01-02 DIAGNOSIS — M542 Cervicalgia: Secondary | ICD-10-CM | POA: Diagnosis not present

## 2015-01-02 DIAGNOSIS — E669 Obesity, unspecified: Secondary | ICD-10-CM | POA: Diagnosis not present

## 2015-01-02 DIAGNOSIS — M79609 Pain in unspecified limb: Secondary | ICD-10-CM | POA: Diagnosis not present

## 2015-01-30 DIAGNOSIS — G894 Chronic pain syndrome: Secondary | ICD-10-CM | POA: Diagnosis not present

## 2015-01-30 DIAGNOSIS — M79609 Pain in unspecified limb: Secondary | ICD-10-CM | POA: Diagnosis not present

## 2015-01-30 DIAGNOSIS — M542 Cervicalgia: Secondary | ICD-10-CM | POA: Diagnosis not present

## 2015-01-30 DIAGNOSIS — Z79899 Other long term (current) drug therapy: Secondary | ICD-10-CM | POA: Diagnosis not present

## 2015-02-25 DIAGNOSIS — M797 Fibromyalgia: Secondary | ICD-10-CM | POA: Diagnosis not present

## 2015-02-25 DIAGNOSIS — E1165 Type 2 diabetes mellitus with hyperglycemia: Secondary | ICD-10-CM | POA: Diagnosis not present

## 2015-02-25 DIAGNOSIS — K219 Gastro-esophageal reflux disease without esophagitis: Secondary | ICD-10-CM | POA: Diagnosis not present

## 2015-02-25 DIAGNOSIS — I10 Essential (primary) hypertension: Secondary | ICD-10-CM | POA: Diagnosis not present

## 2015-02-25 DIAGNOSIS — F0781 Postconcussional syndrome: Secondary | ICD-10-CM | POA: Diagnosis not present

## 2015-02-25 DIAGNOSIS — F419 Anxiety disorder, unspecified: Secondary | ICD-10-CM | POA: Diagnosis not present

## 2015-02-25 DIAGNOSIS — E1042 Type 1 diabetes mellitus with diabetic polyneuropathy: Secondary | ICD-10-CM | POA: Diagnosis not present

## 2015-02-25 DIAGNOSIS — L309 Dermatitis, unspecified: Secondary | ICD-10-CM | POA: Diagnosis not present

## 2015-02-25 DIAGNOSIS — N3281 Overactive bladder: Secondary | ICD-10-CM | POA: Diagnosis not present

## 2015-02-27 DIAGNOSIS — M5030 Other cervical disc degeneration, unspecified cervical region: Secondary | ICD-10-CM | POA: Diagnosis not present

## 2015-02-27 DIAGNOSIS — M1990 Unspecified osteoarthritis, unspecified site: Secondary | ICD-10-CM | POA: Diagnosis not present

## 2015-02-27 DIAGNOSIS — Z79899 Other long term (current) drug therapy: Secondary | ICD-10-CM | POA: Diagnosis not present

## 2015-02-27 DIAGNOSIS — M5134 Other intervertebral disc degeneration, thoracic region: Secondary | ICD-10-CM | POA: Diagnosis not present

## 2015-02-27 DIAGNOSIS — M47812 Spondylosis without myelopathy or radiculopathy, cervical region: Secondary | ICD-10-CM | POA: Diagnosis not present

## 2015-02-27 DIAGNOSIS — Z79891 Long term (current) use of opiate analgesic: Secondary | ICD-10-CM | POA: Diagnosis not present

## 2015-02-27 DIAGNOSIS — M961 Postlaminectomy syndrome, not elsewhere classified: Secondary | ICD-10-CM | POA: Diagnosis not present

## 2015-02-27 DIAGNOSIS — M545 Low back pain: Secondary | ICD-10-CM | POA: Diagnosis not present

## 2015-02-27 DIAGNOSIS — M542 Cervicalgia: Secondary | ICD-10-CM | POA: Diagnosis not present

## 2015-02-27 DIAGNOSIS — G894 Chronic pain syndrome: Secondary | ICD-10-CM | POA: Diagnosis not present

## 2015-03-31 DIAGNOSIS — Z79899 Other long term (current) drug therapy: Secondary | ICD-10-CM | POA: Diagnosis not present

## 2015-03-31 DIAGNOSIS — M542 Cervicalgia: Secondary | ICD-10-CM | POA: Diagnosis not present

## 2015-03-31 DIAGNOSIS — G894 Chronic pain syndrome: Secondary | ICD-10-CM | POA: Diagnosis not present

## 2015-03-31 DIAGNOSIS — M47812 Spondylosis without myelopathy or radiculopathy, cervical region: Secondary | ICD-10-CM | POA: Diagnosis not present

## 2015-03-31 DIAGNOSIS — M5030 Other cervical disc degeneration, unspecified cervical region: Secondary | ICD-10-CM | POA: Diagnosis not present

## 2015-03-31 DIAGNOSIS — M545 Low back pain: Secondary | ICD-10-CM | POA: Diagnosis not present

## 2015-03-31 DIAGNOSIS — M79609 Pain in unspecified limb: Secondary | ICD-10-CM | POA: Diagnosis not present

## 2015-04-28 DIAGNOSIS — M545 Low back pain: Secondary | ICD-10-CM | POA: Diagnosis not present

## 2015-04-28 DIAGNOSIS — M792 Neuralgia and neuritis, unspecified: Secondary | ICD-10-CM | POA: Diagnosis not present

## 2015-04-28 DIAGNOSIS — G894 Chronic pain syndrome: Secondary | ICD-10-CM | POA: Diagnosis not present

## 2015-04-28 DIAGNOSIS — Z79899 Other long term (current) drug therapy: Secondary | ICD-10-CM | POA: Diagnosis not present

## 2015-05-01 DIAGNOSIS — M549 Dorsalgia, unspecified: Secondary | ICD-10-CM | POA: Diagnosis not present

## 2015-05-01 DIAGNOSIS — N3000 Acute cystitis without hematuria: Secondary | ICD-10-CM | POA: Diagnosis not present

## 2015-05-01 DIAGNOSIS — J209 Acute bronchitis, unspecified: Secondary | ICD-10-CM | POA: Diagnosis not present

## 2015-05-08 ENCOUNTER — Telehealth (HOSPITAL_COMMUNITY): Payer: Self-pay

## 2015-05-08 NOTE — Telephone Encounter (Signed)
Called pt to schedule f/u angio, left message for pt to call back. AW

## 2015-05-15 ENCOUNTER — Telehealth (HOSPITAL_COMMUNITY): Payer: Self-pay

## 2015-05-15 NOTE — Telephone Encounter (Signed)
Called pt to schedule follow up, no answer/mailbox was full. Will try and call back later next week. AW

## 2015-05-26 DIAGNOSIS — M542 Cervicalgia: Secondary | ICD-10-CM | POA: Diagnosis not present

## 2015-05-26 DIAGNOSIS — Z79891 Long term (current) use of opiate analgesic: Secondary | ICD-10-CM | POA: Diagnosis not present

## 2015-05-26 DIAGNOSIS — Z79899 Other long term (current) drug therapy: Secondary | ICD-10-CM | POA: Diagnosis not present

## 2015-05-26 DIAGNOSIS — M47812 Spondylosis without myelopathy or radiculopathy, cervical region: Secondary | ICD-10-CM | POA: Diagnosis not present

## 2015-05-26 DIAGNOSIS — G894 Chronic pain syndrome: Secondary | ICD-10-CM | POA: Diagnosis not present

## 2015-05-26 DIAGNOSIS — M5030 Other cervical disc degeneration, unspecified cervical region: Secondary | ICD-10-CM | POA: Diagnosis not present

## 2015-06-03 ENCOUNTER — Other Ambulatory Visit (HOSPITAL_COMMUNITY): Payer: Self-pay | Admitting: Interventional Radiology

## 2015-06-03 DIAGNOSIS — I729 Aneurysm of unspecified site: Secondary | ICD-10-CM

## 2015-06-22 ENCOUNTER — Encounter (HOSPITAL_COMMUNITY): Payer: Self-pay | Admitting: Emergency Medicine

## 2015-06-22 ENCOUNTER — Emergency Department (HOSPITAL_COMMUNITY)
Admission: EM | Admit: 2015-06-22 | Discharge: 2015-06-22 | Disposition: A | Discharge status: Home / Self Care | Payer: Medicare Other | Attending: Emergency Medicine | Admitting: Emergency Medicine

## 2015-06-22 ENCOUNTER — Emergency Department (HOSPITAL_COMMUNITY): Payer: Medicare Other

## 2015-06-22 DIAGNOSIS — S0990XA Unspecified injury of head, initial encounter: Secondary | ICD-10-CM | POA: Diagnosis present

## 2015-06-22 DIAGNOSIS — Y998 Other external cause status: Secondary | ICD-10-CM | POA: Diagnosis not present

## 2015-06-22 DIAGNOSIS — R55 Syncope and collapse: Secondary | ICD-10-CM | POA: Diagnosis not present

## 2015-06-22 DIAGNOSIS — K219 Gastro-esophageal reflux disease without esophagitis: Secondary | ICD-10-CM | POA: Diagnosis not present

## 2015-06-22 DIAGNOSIS — I10 Essential (primary) hypertension: Secondary | ICD-10-CM | POA: Diagnosis not present

## 2015-06-22 DIAGNOSIS — F329 Major depressive disorder, single episode, unspecified: Secondary | ICD-10-CM | POA: Insufficient documentation

## 2015-06-22 DIAGNOSIS — Y9389 Activity, other specified: Secondary | ICD-10-CM | POA: Insufficient documentation

## 2015-06-22 DIAGNOSIS — Z9889 Other specified postprocedural states: Secondary | ICD-10-CM | POA: Diagnosis not present

## 2015-06-22 DIAGNOSIS — K5900 Constipation, unspecified: Secondary | ICD-10-CM | POA: Diagnosis not present

## 2015-06-22 DIAGNOSIS — Z792 Long term (current) use of antibiotics: Secondary | ICD-10-CM | POA: Diagnosis not present

## 2015-06-22 DIAGNOSIS — M1990 Unspecified osteoarthritis, unspecified site: Secondary | ICD-10-CM | POA: Insufficient documentation

## 2015-06-22 DIAGNOSIS — Y9289 Other specified places as the place of occurrence of the external cause: Secondary | ICD-10-CM | POA: Diagnosis not present

## 2015-06-22 DIAGNOSIS — M542 Cervicalgia: Secondary | ICD-10-CM | POA: Diagnosis not present

## 2015-06-22 DIAGNOSIS — Z8673 Personal history of transient ischemic attack (TIA), and cerebral infarction without residual deficits: Secondary | ICD-10-CM | POA: Diagnosis not present

## 2015-06-22 DIAGNOSIS — W01190A Fall on same level from slipping, tripping and stumbling with subsequent striking against furniture, initial encounter: Secondary | ICD-10-CM | POA: Insufficient documentation

## 2015-06-22 DIAGNOSIS — E119 Type 2 diabetes mellitus without complications: Secondary | ICD-10-CM | POA: Diagnosis not present

## 2015-06-22 DIAGNOSIS — S060X9A Concussion with loss of consciousness of unspecified duration, initial encounter: Secondary | ICD-10-CM | POA: Diagnosis not present

## 2015-06-22 DIAGNOSIS — E785 Hyperlipidemia, unspecified: Secondary | ICD-10-CM | POA: Diagnosis not present

## 2015-06-22 DIAGNOSIS — S199XXA Unspecified injury of neck, initial encounter: Secondary | ICD-10-CM | POA: Insufficient documentation

## 2015-06-22 DIAGNOSIS — E041 Nontoxic single thyroid nodule: Secondary | ICD-10-CM | POA: Diagnosis not present

## 2015-06-22 DIAGNOSIS — R51 Headache: Secondary | ICD-10-CM | POA: Diagnosis not present

## 2015-06-22 DIAGNOSIS — F17210 Nicotine dependence, cigarettes, uncomplicated: Secondary | ICD-10-CM | POA: Insufficient documentation

## 2015-06-22 DIAGNOSIS — Z8669 Personal history of other diseases of the nervous system and sense organs: Secondary | ICD-10-CM | POA: Diagnosis not present

## 2015-06-22 DIAGNOSIS — S0003XA Contusion of scalp, initial encounter: Secondary | ICD-10-CM | POA: Diagnosis not present

## 2015-06-22 DIAGNOSIS — S069X9A Unspecified intracranial injury with loss of consciousness of unspecified duration, initial encounter: Secondary | ICD-10-CM

## 2015-06-22 DIAGNOSIS — Z98811 Dental restoration status: Secondary | ICD-10-CM | POA: Diagnosis not present

## 2015-06-22 DIAGNOSIS — F419 Anxiety disorder, unspecified: Secondary | ICD-10-CM | POA: Diagnosis not present

## 2015-06-22 DIAGNOSIS — Z8614 Personal history of Methicillin resistant Staphylococcus aureus infection: Secondary | ICD-10-CM | POA: Diagnosis not present

## 2015-06-22 DIAGNOSIS — M797 Fibromyalgia: Secondary | ICD-10-CM | POA: Diagnosis not present

## 2015-06-22 DIAGNOSIS — Z91040 Latex allergy status: Secondary | ICD-10-CM | POA: Insufficient documentation

## 2015-06-22 DIAGNOSIS — S098XXA Other specified injuries of head, initial encounter: Secondary | ICD-10-CM | POA: Diagnosis not present

## 2015-06-22 DIAGNOSIS — Z79899 Other long term (current) drug therapy: Secondary | ICD-10-CM | POA: Insufficient documentation

## 2015-06-22 DIAGNOSIS — Z794 Long term (current) use of insulin: Secondary | ICD-10-CM | POA: Diagnosis not present

## 2015-06-22 DIAGNOSIS — T148 Other injury of unspecified body region: Secondary | ICD-10-CM | POA: Diagnosis not present

## 2015-06-22 MED ORDER — IBUPROFEN 800 MG PO TABS
800.0000 mg | ORAL_TABLET | Freq: Once | ORAL | Status: AC
  Administered 2015-06-22: 800 mg via ORAL
  Filled 2015-06-22: qty 1
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
## 2015-06-22 NOTE — ED Notes (Signed)
Walk with assistance  Tolerated ok

## 2015-06-22 NOTE — Discharge Instructions (Signed)
Please read and follow all provided instructions.  Your diagnoses today include:  1. Minor head injury with loss of consciousness, initial encounter   2. Thyroid nodule     Tests performed today include:  CT scan of your head and neck that did not show any serious injury.  CT does show thyroid nodule. You should have this checked by your family doctor.   Vital signs. See below for your results today.   Medications prescribed:   None  Take any prescribed medications only as directed.  Home care instructions:  Follow any educational materials contained in this packet.  Do not take any medications containing aspirin for one week as this can interfere with your body's ability to clot.   BE VERY CAREFUL not to take multiple medicines containing Tylenol (also called acetaminophen). Doing so can lead to an overdose which can damage your liver and cause liver failure and possibly death.   Follow-up instructions: Please follow-up with your primary care provider in the next 3 days for further evaluation of your symptoms.   Return instructions:  SEEK IMMEDIATE MEDICAL ATTENTION IF:  There is confusion or drowsiness (although children frequently become drowsy after injury).   You cannot awaken the injured person.   You have more than one episode of vomiting.   You notice dizziness or unsteadiness which is getting worse, or inability to walk.   You have convulsions or unconsciousness.   You experience severe, persistent headaches not relieved by Tylenol.  You cannot use arms or legs normally.   There are changes in pupil sizes. (This is the black center in the colored part of the eye)   There is clear or bloody discharge from the nose or ears.   You have change in speech, vision, swallowing, or understanding.   Localized weakness, numbness, tingling, or change in bowel or bladder control.  You have any other emergent concerns.  Additional Information: You have had a head  injury which does not appear to require admission at this time.  Your vital signs today were: BP 164/94 mmHg   Pulse 78   Temp(Src) 98.2 F (36.8 C) (Oral)   Resp 18   Ht 5\' 4"  (1.626 m)   Wt 67.132 kg   BMI 25.39 kg/m2   SpO2 98% If your blood pressure (BP) was elevated above 135/85 this visit, please have this repeated by your doctor within one month. --------------
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
## 2015-06-22 NOTE — ED Notes (Signed)
Josh, PA at bedside. °
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
## 2015-06-23 ENCOUNTER — Emergency Department (HOSPITAL_COMMUNITY): Payer: Medicare Other

## 2015-06-23 ENCOUNTER — Emergency Department (HOSPITAL_COMMUNITY)
Admission: EM | Admit: 2015-06-23 | Discharge: 2015-06-24 | Disposition: A | Discharge status: Home / Self Care | Payer: Medicare Other | Attending: Emergency Medicine | Admitting: Emergency Medicine

## 2015-06-23 ENCOUNTER — Encounter (HOSPITAL_COMMUNITY): Payer: Self-pay

## 2015-06-23 DIAGNOSIS — F17210 Nicotine dependence, cigarettes, uncomplicated: Secondary | ICD-10-CM | POA: Diagnosis not present

## 2015-06-23 DIAGNOSIS — F329 Major depressive disorder, single episode, unspecified: Secondary | ICD-10-CM | POA: Diagnosis not present

## 2015-06-23 DIAGNOSIS — Y9289 Other specified places as the place of occurrence of the external cause: Secondary | ICD-10-CM | POA: Diagnosis not present

## 2015-06-23 DIAGNOSIS — S060X0D Concussion without loss of consciousness, subsequent encounter: Secondary | ICD-10-CM | POA: Insufficient documentation

## 2015-06-23 DIAGNOSIS — Z794 Long term (current) use of insulin: Secondary | ICD-10-CM | POA: Insufficient documentation

## 2015-06-23 DIAGNOSIS — Z79899 Other long term (current) drug therapy: Secondary | ICD-10-CM | POA: Insufficient documentation

## 2015-06-23 DIAGNOSIS — Z872 Personal history of diseases of the skin and subcutaneous tissue: Secondary | ICD-10-CM | POA: Insufficient documentation

## 2015-06-23 DIAGNOSIS — Z91040 Latex allergy status: Secondary | ICD-10-CM | POA: Insufficient documentation

## 2015-06-23 DIAGNOSIS — Z8673 Personal history of transient ischemic attack (TIA), and cerebral infarction without residual deficits: Secondary | ICD-10-CM | POA: Diagnosis not present

## 2015-06-23 DIAGNOSIS — I10 Essential (primary) hypertension: Secondary | ICD-10-CM | POA: Insufficient documentation

## 2015-06-23 DIAGNOSIS — R42 Dizziness and giddiness: Secondary | ICD-10-CM | POA: Insufficient documentation

## 2015-06-23 DIAGNOSIS — K5900 Constipation, unspecified: Secondary | ICD-10-CM | POA: Diagnosis not present

## 2015-06-23 DIAGNOSIS — E119 Type 2 diabetes mellitus without complications: Secondary | ICD-10-CM | POA: Insufficient documentation

## 2015-06-23 DIAGNOSIS — Z8614 Personal history of Methicillin resistant Staphylococcus aureus infection: Secondary | ICD-10-CM | POA: Diagnosis not present

## 2015-06-23 DIAGNOSIS — Z972 Presence of dental prosthetic device (complete) (partial): Secondary | ICD-10-CM | POA: Insufficient documentation

## 2015-06-23 DIAGNOSIS — W501XXA Accidental kick by another person, initial encounter: Secondary | ICD-10-CM | POA: Insufficient documentation

## 2015-06-23 DIAGNOSIS — K219 Gastro-esophageal reflux disease without esophagitis: Secondary | ICD-10-CM | POA: Diagnosis not present

## 2015-06-23 DIAGNOSIS — E785 Hyperlipidemia, unspecified: Secondary | ICD-10-CM | POA: Insufficient documentation

## 2015-06-23 DIAGNOSIS — M1990 Unspecified osteoarthritis, unspecified site: Secondary | ICD-10-CM | POA: Insufficient documentation

## 2015-06-23 DIAGNOSIS — F419 Anxiety disorder, unspecified: Secondary | ICD-10-CM | POA: Diagnosis not present

## 2015-06-23 DIAGNOSIS — Y998 Other external cause status: Secondary | ICD-10-CM | POA: Insufficient documentation

## 2015-06-23 DIAGNOSIS — Y9389 Activity, other specified: Secondary | ICD-10-CM | POA: Insufficient documentation

## 2015-06-23 DIAGNOSIS — G478 Other sleep disorders: Secondary | ICD-10-CM | POA: Diagnosis not present

## 2015-06-23 DIAGNOSIS — S0990XA Unspecified injury of head, initial encounter: Secondary | ICD-10-CM | POA: Diagnosis present

## 2015-06-23 DIAGNOSIS — S060X0A Concussion without loss of consciousness, initial encounter: Secondary | ICD-10-CM | POA: Diagnosis not present

## 2015-06-23 DIAGNOSIS — M797 Fibromyalgia: Secondary | ICD-10-CM | POA: Insufficient documentation

## 2015-06-23 DIAGNOSIS — R51 Headache: Secondary | ICD-10-CM | POA: Diagnosis not present

## 2015-06-23 DIAGNOSIS — Z9889 Other specified postprocedural states: Secondary | ICD-10-CM | POA: Insufficient documentation

## 2015-06-23 DIAGNOSIS — Z862 Personal history of diseases of the blood and blood-forming organs and certain disorders involving the immune mechanism: Secondary | ICD-10-CM | POA: Insufficient documentation

## 2015-06-23 DIAGNOSIS — Z792 Long term (current) use of antibiotics: Secondary | ICD-10-CM | POA: Insufficient documentation

## 2015-06-23 DIAGNOSIS — Z973 Presence of spectacles and contact lenses: Secondary | ICD-10-CM | POA: Diagnosis not present

## 2015-06-23 MED ORDER — LORAZEPAM 1 MG PO TABS
0.5000 mg | ORAL_TABLET | Freq: Once | ORAL | Status: AC
  Administered 2015-06-23: 0.5 mg via ORAL
  Filled 2015-06-23: qty 1

## 2015-06-23 MED ORDER — MECLIZINE HCL 12.5 MG PO TABS: 12.5000 mg | ORAL_TABLET | Freq: Three times a day (TID) | ORAL | Status: DC | PRN

## 2015-06-23 MED ORDER — MECLIZINE HCL 25 MG PO TABS
12.5000 mg | ORAL_TABLET | Freq: Once | ORAL | Status: AC
  Administered 2015-06-23: 12.5 mg via ORAL
  Filled 2015-06-23: qty 1

## 2015-06-23 NOTE — ED Notes (Signed)
Patient transported to CT via stretcher.
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
## 2015-06-23 NOTE — Discharge Instructions (Signed)
It was our pleasure to provide your ER care today - we hope that you feel better.  Rest. Drink adequate fluids.  You may take antivert as need for dizziness - may make drowsy, no driving when taking.  Follow up with primary care doctor in the coming week - call office tomorrow to arrange follow up with them.  Also have your blood pressure rechecked at follow up, as it was high tonight.   Return to ER if worse, new symptoms, severe headache, persistent vomiting, other concern.   You were given medication in the ER - no driving for the next 4 hours, or any time when dizzy.        Dizziness Dizziness is a common problem. It is a feeling of unsteadiness or light-headedness. You may feel like you are about to faint. Dizziness can lead to injury if you stumble or fall. Anyone can become dizzy, but dizziness is more common in older adults. This condition can be caused by a number of things, including medicines, dehydration, or illness. HOME CARE INSTRUCTIONS Taking these steps may help with your condition: Eating and Drinking  Drink enough fluid to keep your urine clear or pale yellow. This helps to keep you from becoming dehydrated. Try to drink more clear fluids, such as water.  Do not drink alcohol.  Limit your caffeine intake if directed by your health care provider.  Limit your salt intake if directed by your health care provider. Activity  Avoid making quick movements.  Rise slowly from chairs and steady yourself until you feel okay.  In the morning, first sit up on the side of the bed. When you feel okay, stand slowly while you hold onto something until you know that your balance is fine.  Move your legs often if you need to stand in one place for a long time. Tighten and relax your muscles in your legs while you are standing.  Do not drive or operate heavy machinery if you feel dizzy.  Avoid bending down if you feel dizzy. Place items in your home so that they are easy for  you to reach without leaning over. Lifestyle  Do not use any tobacco products, including cigarettes, chewing tobacco, or electronic cigarettes. If you need help quitting, ask your health care provider.  Try to reduce your stress level, such as with yoga or meditation. Talk with your health care provider if you need help. General Instructions  Watch your dizziness for any changes.  Take medicines only as directed by your health care provider. Talk with your health care provider if you think that your dizziness is caused by a medicine that you are taking.  Tell a friend or a family member that you are feeling dizzy. If he or she notices any changes in your behavior, have this person call your health care provider.  Keep all follow-up visits as directed by your health care provider. This is important. SEEK MEDICAL CARE IF:  Your dizziness does not go away.  Your dizziness or light-headedness gets worse.  You feel nauseous.  You have reduced hearing.  You have new symptoms.  You are unsteady on your feet or you feel like the room is spinning. SEEK IMMEDIATE MEDICAL CARE IF:  You vomit or have diarrhea and are unable to eat or drink anything.  You have problems talking, walking, swallowing, or using your arms, hands, or legs.  You feel generally weak.  You are not thinking clearly or you have trouble forming sentences.  It may take a friend or family member to notice this.  You have chest pain, abdominal pain, shortness of breath, or sweating.  Your vision changes.  You notice any bleeding.  You have a headache.  You have neck pain or a stiff neck.  You have a fever.   This information is not intended to replace advice given to you by your health care provider. Make sure you discuss any questions you have with your health care provider.   Document Released: 12/15/2000 Document Revised: 11/05/2014 Document Reviewed: 06/17/2014 Elsevier Interactive Patient Education [employer]
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
## 2015-06-24 DIAGNOSIS — S060X9A Concussion with loss of consciousness of unspecified duration, initial encounter: Secondary | ICD-10-CM | POA: Diagnosis not present

## 2015-06-24 DIAGNOSIS — L309 Dermatitis, unspecified: Secondary | ICD-10-CM | POA: Diagnosis not present

## 2015-06-24 DIAGNOSIS — Z23 Encounter for immunization: Secondary | ICD-10-CM | POA: Diagnosis not present

## 2015-06-24 DIAGNOSIS — R42 Dizziness and giddiness: Secondary | ICD-10-CM | POA: Diagnosis not present

## 2015-06-24 DIAGNOSIS — E041 Nontoxic single thyroid nodule: Secondary | ICD-10-CM | POA: Diagnosis not present

## 2015-06-24 DIAGNOSIS — S0003XA Contusion of scalp, initial encounter: Secondary | ICD-10-CM | POA: Diagnosis not present

## 2015-06-25 ENCOUNTER — Other Ambulatory Visit: Payer: Self-pay | Admitting: Family Medicine

## 2015-06-25 DIAGNOSIS — E079 Disorder of thyroid, unspecified: Secondary | ICD-10-CM

## 2015-06-27 ENCOUNTER — Ambulatory Visit
Admission: RE | Admit: 2015-06-27 | Discharge: 2015-06-27 | Disposition: A | Discharge status: Home / Self Care | Payer: Medicare Other | Source: Ambulatory Visit | Attending: Family Medicine | Admitting: Family Medicine

## 2015-06-27 DIAGNOSIS — E079 Disorder of thyroid, unspecified: Secondary | ICD-10-CM

## 2015-06-27 DIAGNOSIS — E042 Nontoxic multinodular goiter: Secondary | ICD-10-CM | POA: Diagnosis not present

## 2015-07-02 ENCOUNTER — Other Ambulatory Visit: Payer: Self-pay | Admitting: Family Medicine

## 2015-07-02 DIAGNOSIS — E041 Nontoxic single thyroid nodule: Secondary | ICD-10-CM

## 2015-07-03 DIAGNOSIS — M549 Dorsalgia, unspecified: Secondary | ICD-10-CM | POA: Diagnosis not present

## 2015-07-03 DIAGNOSIS — M47817 Spondylosis without myelopathy or radiculopathy, lumbosacral region: Secondary | ICD-10-CM | POA: Diagnosis not present

## 2015-07-03 DIAGNOSIS — M5030 Other cervical disc degeneration, unspecified cervical region: Secondary | ICD-10-CM | POA: Diagnosis not present

## 2015-07-03 DIAGNOSIS — Z79899 Other long term (current) drug therapy: Secondary | ICD-10-CM | POA: Diagnosis not present

## 2015-07-03 DIAGNOSIS — G894 Chronic pain syndrome: Secondary | ICD-10-CM | POA: Diagnosis not present

## 2015-07-03 DIAGNOSIS — M47812 Spondylosis without myelopathy or radiculopathy, cervical region: Secondary | ICD-10-CM | POA: Diagnosis not present

## 2015-07-15 ENCOUNTER — Other Ambulatory Visit (HOSPITAL_COMMUNITY)
Admission: RE | Admit: 2015-07-15 | Discharge: 2015-07-15 | Disposition: A | Discharge status: Home / Self Care | Payer: Medicare Other | Source: Ambulatory Visit | Attending: Diagnostic Radiology | Admitting: Diagnostic Radiology

## 2015-07-15 ENCOUNTER — Ambulatory Visit
Admission: RE | Admit: 2015-07-15 | Discharge: 2015-07-15 | Disposition: A | Discharge status: Home / Self Care | Payer: Medicare Other | Source: Ambulatory Visit | Attending: Family Medicine | Admitting: Family Medicine

## 2015-07-15 DIAGNOSIS — E041 Nontoxic single thyroid nodule: Secondary | ICD-10-CM | POA: Insufficient documentation

## 2015-07-21 DIAGNOSIS — M47812 Spondylosis without myelopathy or radiculopathy, cervical region: Secondary | ICD-10-CM | POA: Diagnosis not present

## 2015-07-21 DIAGNOSIS — M5030 Other cervical disc degeneration, unspecified cervical region: Secondary | ICD-10-CM | POA: Diagnosis not present

## 2015-07-21 DIAGNOSIS — M542 Cervicalgia: Secondary | ICD-10-CM | POA: Diagnosis not present

## 2015-07-31 DIAGNOSIS — M47812 Spondylosis without myelopathy or radiculopathy, cervical region: Secondary | ICD-10-CM | POA: Diagnosis not present

## 2015-07-31 DIAGNOSIS — Z79891 Long term (current) use of opiate analgesic: Secondary | ICD-10-CM | POA: Diagnosis not present

## 2015-07-31 DIAGNOSIS — Z79899 Other long term (current) drug therapy: Secondary | ICD-10-CM | POA: Diagnosis not present

## 2015-07-31 DIAGNOSIS — G894 Chronic pain syndrome: Secondary | ICD-10-CM | POA: Diagnosis not present

## 2015-07-31 DIAGNOSIS — M47817 Spondylosis without myelopathy or radiculopathy, lumbosacral region: Secondary | ICD-10-CM | POA: Diagnosis not present

## 2015-07-31 DIAGNOSIS — M5030 Other cervical disc degeneration, unspecified cervical region: Secondary | ICD-10-CM | POA: Diagnosis not present

## 2015-08-04 ENCOUNTER — Other Ambulatory Visit: Payer: Self-pay | Admitting: General Surgery

## 2015-08-05 ENCOUNTER — Encounter (HOSPITAL_COMMUNITY): Payer: Self-pay

## 2015-08-05 ENCOUNTER — Other Ambulatory Visit (HOSPITAL_COMMUNITY): Payer: Self-pay | Admitting: Interventional Radiology

## 2015-08-05 ENCOUNTER — Ambulatory Visit (HOSPITAL_COMMUNITY)
Admission: RE | Admit: 2015-08-05 | Discharge: 2015-08-05 | Disposition: A | Discharge status: Home / Self Care | Payer: Medicare Other | Source: Ambulatory Visit | Attending: Interventional Radiology | Admitting: Interventional Radiology

## 2015-08-05 DIAGNOSIS — Z8673 Personal history of transient ischemic attack (TIA), and cerebral infarction without residual deficits: Secondary | ICD-10-CM | POA: Insufficient documentation

## 2015-08-05 DIAGNOSIS — I10 Essential (primary) hypertension: Secondary | ICD-10-CM | POA: Insufficient documentation

## 2015-08-05 DIAGNOSIS — Z794 Long term (current) use of insulin: Secondary | ICD-10-CM | POA: Diagnosis not present

## 2015-08-05 DIAGNOSIS — Z8249 Family history of ischemic heart disease and other diseases of the circulatory system: Secondary | ICD-10-CM | POA: Diagnosis not present

## 2015-08-05 DIAGNOSIS — Z87728 Personal history of other specified (corrected) congenital malformations of nervous system and sense organs: Secondary | ICD-10-CM | POA: Insufficient documentation

## 2015-08-05 DIAGNOSIS — R42 Dizziness and giddiness: Secondary | ICD-10-CM | POA: Insufficient documentation

## 2015-08-05 DIAGNOSIS — F329 Major depressive disorder, single episode, unspecified: Secondary | ICD-10-CM | POA: Insufficient documentation

## 2015-08-05 DIAGNOSIS — F419 Anxiety disorder, unspecified: Secondary | ICD-10-CM | POA: Insufficient documentation

## 2015-08-05 DIAGNOSIS — M1990 Unspecified osteoarthritis, unspecified site: Secondary | ICD-10-CM | POA: Diagnosis not present

## 2015-08-05 DIAGNOSIS — Z8719 Personal history of other diseases of the digestive system: Secondary | ICD-10-CM | POA: Diagnosis not present

## 2015-08-05 DIAGNOSIS — Z91040 Latex allergy status: Secondary | ICD-10-CM | POA: Insufficient documentation

## 2015-08-05 DIAGNOSIS — E119 Type 2 diabetes mellitus without complications: Secondary | ICD-10-CM | POA: Insufficient documentation

## 2015-08-05 DIAGNOSIS — R51 Headache: Secondary | ICD-10-CM | POA: Diagnosis not present

## 2015-08-05 DIAGNOSIS — Z09 Encounter for follow-up examination after completed treatment for conditions other than malignant neoplasm: Secondary | ICD-10-CM | POA: Diagnosis not present

## 2015-08-05 DIAGNOSIS — K5909 Other constipation: Secondary | ICD-10-CM | POA: Diagnosis not present

## 2015-08-05 DIAGNOSIS — Z8614 Personal history of Methicillin resistant Staphylococcus aureus infection: Secondary | ICD-10-CM | POA: Insufficient documentation

## 2015-08-05 DIAGNOSIS — K219 Gastro-esophageal reflux disease without esophagitis: Secondary | ICD-10-CM | POA: Diagnosis not present

## 2015-08-05 DIAGNOSIS — I725 Aneurysm of other precerebral arteries: Secondary | ICD-10-CM | POA: Diagnosis not present

## 2015-08-05 DIAGNOSIS — E785 Hyperlipidemia, unspecified: Secondary | ICD-10-CM | POA: Insufficient documentation

## 2015-08-05 DIAGNOSIS — F17210 Nicotine dependence, cigarettes, uncomplicated: Secondary | ICD-10-CM | POA: Diagnosis not present

## 2015-08-05 DIAGNOSIS — I729 Aneurysm of unspecified site: Secondary | ICD-10-CM

## 2015-08-05 DIAGNOSIS — M797 Fibromyalgia: Secondary | ICD-10-CM | POA: Diagnosis not present

## 2015-08-05 LAB — BASIC METABOLIC PANEL
Anion gap: 11 (ref 5–15)
BUN: 11 mg/dL (ref 6–20)
CALCIUM: 9.2 mg/dL (ref 8.9–10.3)
CO2: 27 mmol/L (ref 22–32)
CREATININE: 0.55 mg/dL (ref 0.44–1.00)
Chloride: 103 mmol/L (ref 101–111)
GFR calc Af Amer: >60 mL/min (ref >60)
Glucose, Bld: 157 mg/dL — ABNORMAL HIGH (ref 65–99)
POTASSIUM: 4.1 mmol/L (ref 3.5–5.1)
SODIUM: 141 mmol/L (ref 135–145)

## 2015-08-05 LAB — CBC
HEMATOCRIT: 40.4 % (ref 36.0–46.0)
Hemoglobin: 13.3 g/dL (ref 12.0–15.0)
MCH: 28.4 pg (ref 26.0–34.0)
MCHC: 32.9 g/dL (ref 30.0–36.0)
MCV: 86.1 fL (ref 78.0–100.0)
PLATELETS: 157 10*3/uL (ref 150–400)
RBC: 4.69 MIL/uL (ref 3.87–5.11)
RDW: 13.7 % (ref 11.5–15.5)
WBC: 10 10*3/uL (ref 4.0–10.5)

## 2015-08-05 LAB — GLUCOSE, CAPILLARY
GLUCOSE-CAPILLARY: 155 mg/dL — AB (ref 65–99)
Glucose-Capillary: 128 mg/dL — ABNORMAL HIGH (ref 65–99)

## 2015-08-05 LAB — APTT: APTT: 29 s (ref 24–37)

## 2015-08-05 LAB — PROTIME-INR
INR: 1.01 (ref 0.00–1.49)
PROTHROMBIN TIME: 13.5 s (ref 11.6–15.2)

## 2015-08-05 MED ORDER — HEPARIN SODIUM (PORCINE) 1000 UNIT/ML IJ SOLN
INTRAMUSCULAR | Status: AC | PRN
  Administered 2015-08-05: 1000 [IU] via INTRAVENOUS

## 2015-08-05 MED ORDER — MIDAZOLAM HCL 2 MG/2ML IJ SOLN
INTRAMUSCULAR | Status: AC
  Filled 2015-08-05: qty 2

## 2015-08-05 MED ORDER — SODIUM CHLORIDE 0.9 % IV SOLN: INTRAVENOUS | Status: DC

## 2015-08-05 MED ORDER — FENTANYL CITRATE (PF) 100 MCG/2ML IJ SOLN
INTRAMUSCULAR | Status: AC
  Filled 2015-08-05: qty 2

## 2015-08-05 MED ORDER — IOHEXOL 300 MG/ML  SOLN
200.0000 mL | Freq: Once | INTRAMUSCULAR | Status: AC | PRN
  Administered 2015-08-05: 80 mL via INTRAVENOUS

## 2015-08-05 MED ORDER — LIDOCAINE HCL 1 % IJ SOLN
INTRAMUSCULAR | Status: AC
  Filled 2015-08-05: qty 20

## 2015-08-05 MED ORDER — FENTANYL CITRATE (PF) 100 MCG/2ML IJ SOLN
INTRAMUSCULAR | Status: AC | PRN
  Administered 2015-08-05 (×2): 25 ug via INTRAVENOUS

## 2015-08-05 MED ORDER — MIDAZOLAM HCL 2 MG/2ML IJ SOLN
INTRAMUSCULAR | Status: AC | PRN
  Administered 2015-08-05: 1 mg via INTRAVENOUS

## 2015-08-05 MED ORDER — SODIUM CHLORIDE 0.9 % IV SOLN
INTRAVENOUS | Status: DC
  Administered 2015-08-05: 10:00:00 via INTRAVENOUS

## 2015-08-05 MED ORDER — HEPARIN SOD (PORK) LOCK FLUSH 100 UNIT/ML IV SOLN
INTRAVENOUS | Status: AC
  Filled 2015-08-05: qty 15

## 2015-08-05 NOTE — Sedation Documentation (Signed)
Patient is resting comfortably. Vitals stable. 

## 2015-08-05 NOTE — Procedures (Signed)
S/P  4 vessel cerebral arterioogram. Rt CFA approach  Findings  1.Obliterated  Basilar artery aneurysm. 2.No new aneurysms seen

## 2015-08-05 NOTE — H&P (Signed)
Chief Complaint: "I'm here for an angiogram"  HPI: Mallory Mendez is an 60 y.o. 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 She has done well over the years, with several follow up angiograms and imaging studies showing stability. She had an MRI/MRA 11/05/2014, which was ok, but last formal cerebral arteriogram was in 2014. She also reports onset of posterior headaches for the past few months. Sometimes associated with nausea, but no viusal changes or aura No hx of migraines. She can lay down and rest or take some OTC pain meds for relief. She is here today for cerebral arteriogram. PMHx, meds, labs, imaging, allergies reviewed. Has been NPO this am.  Past Medical History:  Past Medical History  Diagnosis Date  . Diabetes mellitus   . Hyperlipidemia   . Blood transfusion   . Arthritis   . History of back surgery     multiple  . Hypertension   . Hernia     incisional  . Wears glasses   . MRSA (methicillin resistant Staphylococcus aureus)   . Full dentures   . Chronic constipation   . Umbilical hernia   . Headache(784.0)   . Dizziness   . Concussion     HAS HAD DIZZINESS/ MEMORY PROBLEMS/ SLOW SPEACH /BALANCE PROBLEMS SINCE CONCUSSION 1 1/2 YR AGO PR GOES TO THERAPY  . Fibromyalgia   . Scar   . GERD (gastroesophageal reflux disease)   . Chronic stomach ulcer   . Nocturia   . Sleep difficulties   . Depression   . Anxiety   . 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 Concussion   . Diabetes mellitus   . Paroxysmal vertigo   . Stroke [facility])     crica 2002    Past Surgical History:  Past Surgical History  Procedure Laterality Date  . Cholecystectomy  1984  . Spinal fusion  2006  . Eye surgery  2010    bilateral  . Abdominal hysterectomy    . Cerebral aneurysm repair    . Back surgery      SPINAL FUSION X6   . Incisional hernia repair  05/19/2011    Procedure: HERNIA REPAIR INCISIONAL;  Surgeon: Willey Blade,  MD;  Location: [facility] ORS;  Service: General;  Laterality: N/A;  repair incisional hernia with mesh  . Hernia repair      09/21/2010  . Hernia repair  05/19/11    incisional hernia repair   . Appendectomy    . Tubal ligation    . Left heart catheterization with coronary angiogram N/A 11/05/2013    Procedure: LEFT HEART CATHETERIZATION WITH CORONARY ANGIOGRAM;  Surgeon: Laverda Page, MD;  Location: [facility] CATH LAB;  Service: Cardiovascular;  Laterality: N/A;    Family History:  Family History  Problem Relation Age of Onset  . Breast cancer Mother   . Allergies Daughter   . Heart disease Father     Social History:  reports that she has been smoking Cigarettes.  She has a 19 pack-year smoking history. She has never used smokeless tobacco. She reports that she does not drink alcohol or use illicit drugs.  Allergies:  Allergies  Allergen Reactions  . Cymbalta [Duloxetine Hcl] Anaphylaxis  . Latex Anaphylaxis and Hives    Medications:   Medication List    ASK your doctor about these medications        ALPRAZolam 1 MG tablet  Commonly known as:  XANAX  Take 1 mg by mouth 3 (three) times daily as needed for anxiety.     AMBIEN CR 12.5 MG CR tablet  Generic drug:  zolpidem  Take 12.5 mg by mouth at bedtime.     atorvastatin 40 MG tablet  Commonly known as:  LIPITOR  Take 40 mg by mouth daily at 6 PM.     baclofen 10 MG tablet  Commonly known as:  LIORESAL  Take 10 mg by mouth 2 (two) times daily as needed for muscle spasms.     BIOTIN MAXIMUM STRENGTH 10 MG Tabs  Generic drug:  Biotin  Take by mouth.     desvenlafaxine 100 MG 24 hr tablet  Commonly known as:  PRISTIQ  Take 100 mg by mouth daily.     esomeprazole 40 MG capsule  Commonly known as:  NEXIUM  Take 40 mg by mouth every evening.     estradiol 1 MG tablet  Commonly known as:  ESTRACE  Take 1 mg by mouth daily.     gabapentin 600 MG tablet  Commonly known as:  NEURONTIN  Take 600 mg by mouth 5 (five)  times daily.     ibuprofen 800 MG tablet  Commonly known as:  ADVIL,MOTRIN  Take 800 mg by mouth daily as needed for mild pain.     insulin glargine 100 UNIT/ML injection  Commonly known as:  LANTUS  Inject 26 Units into the skin at bedtime.     insulin lispro 100 UNIT/ML injection  Commonly known as:  HUMALOG  Inject 2-3 Units into the skin 3 (three) times daily before meals. Sliding scale     isosorbide mononitrate 10 MG tablet  Commonly known as:  ISMO,MONOKET  Take 1 tablet (10 mg total) by mouth 3 (three) times daily.     lubiprostone 24 MCG capsule  Commonly known as:  AMITIZA  Take 48 mcg by mouth at bedtime.     meclizine 12.5 MG tablet  Commonly known as:  ANTIVERT  Take 1 tablet (12.5 mg total) by mouth 3 (three) times daily as needed for dizziness.     methocarbamol 500 MG tablet  Commonly known as:  ROBAXIN  Take 500 mg by mouth 2 (two) times daily as needed for muscle spasms.     methylphenidate 20 MG tablet  Commonly known as:  RITALIN  Take 1.5 tablets (30 mg total) by mouth 2 (two) times daily.     minocycline 100 MG tablet  Commonly known as:  DYNACIN  Take 100 mg by mouth 2 (two) times daily.     MYRBETRIQ 25 MG Tb24 tablet  Generic drug:  mirabegron ER  Take 25 mg by mouth every evening.     nicotine 14 mg/24hr patch  Commonly known as:  NICODERM CQ - dosed in mg/24 hours  Place 1 patch (14 mg total) onto the skin daily.     nitroGLYCERIN 0.4 MG/SPRAY spray  Commonly known as:  NITROLINGUAL  Place 1 spray under the tongue every 5 (five) minutes x 3 doses as needed for chest pain.     olmesartan 40 MG tablet  Commonly known as:  BENICAR  Take 40 mg by mouth every evening.     oxyCODONE-acetaminophen 10-325 MG tablet  Commonly known as:  PERCOCET  Take 1 tablet by mouth 4 (four) times daily - after meals and at bedtime.     OPANA ER (CRUSH RESISTANT) 20 MG T12a  Generic drug:  Oxymorphone HCl (Crush Resist)  Take 1 tablet by mouth 2 (two)  times daily.     Oxymorphone ER (Crush Resist) 15 MG T12a  Commonly known as:  OPANA ER (CRUSH RESISTANT)  Take 15 mg by mouth every 12 (twelve) hours.     pantoprazole 40 MG tablet  Commonly known as:  PROTONIX  Take 1 tablet (40 mg total) by mouth daily.     promethazine 25 MG tablet  Commonly known as:  PHENERGAN  Take 25 mg by mouth every 6 (six) hours as needed for nausea.     RESTASIS 0.05 % ophthalmic emulsion  Generic drug:  cycloSPORINE  Place 2 drops into both eyes 2 (two) times daily.     rivastigmine 3 MG capsule  Commonly known as:  EXELON  Take 1 capsule (3 mg total) by mouth 2 (two) times daily.     topiramate 100 MG tablet  Commonly known as:  TOPAMAX  TAKE 1 AND 1/2 TABLETS BY MOUTH DAILY.     torsemide 20 MG tablet  Commonly known as:  DEMADEX  Take 20 mg by mouth every morning.     vitamin C 1000 MG tablet  Take 1,000 mg by mouth daily.        Please HPI for pertinent positives, otherwise complete 10 system ROS negative.  Physical Exam: BP 163/96 mmHg  Pulse 88  Temp(Src) 98.4 F (36.9 C) (Oral)  Resp 18  Ht _0  (1.6 m)  Wt 149 lb (67.586 kg)  BMI 26.40 kg/m2  SpO2 99% Body mass index is 26.4 kg/(m^2).   General Appearance:  Alert, cooperative, no distress, appears stated age  Head:  Normocephalic, without obvious abnormality, atraumatic  ENT: Unremarkable  Neck: Supple, symmetrical, trachea midline  Lungs:   Clear to auscultation bilaterally, no w/r/r, respirations unlabored without use of accessory muscles.  Heart:  Regular rate and rhythm, S1, S2 normal, no murmur, rub or gallop.  Abdomen:   Soft, non-tender, non distended.  Extremities: Extremities normal, atraumatic, no cyanosis or edema  Pulses: 2+ and symmetric femoral pulses  Neurologic: Normal affect, no gross deficits.  Labs: Results for orders placed or performed during the hospital encounter of 08/05/15 (from the past 48 hour(s))  APTT     Status: None   Collection Time:  08/05/15 10:00 AM  Result Value Ref Range   aPTT 29 24 - 37 seconds  Basic metabolic panel     Status: Abnormal   Collection Time: 08/05/15 10:00 AM  Result Value Ref Range   Sodium 141 135 - 145 mmol/L   Potassium 4.1 3.5 - 5.1 mmol/L   Chloride 103 101 - 111 mmol/L   CO2 27 22 - 32 mmol/L   Glucose, Bld 157 (H) 65 - 99 mg/dL   BUN 11 6 - 20 mg/dL   Creatinine, Ser 0.55 0.44 - 1.00 mg/dL   Calcium 9.2 8.9 - 10.3 mg/dL   GFR calc non Af Amer >60 >60 mL/min   GFR calc Af Amer >60 >60 mL/min    Comment: (NOTE) The eGFR has been calculated using the CKD EPI equation. This calculation has not been validated in all clinical situations. eGFR's persistently <60 mL/min signify possible Chronic Kidney Disease.    Anion gap 11 5 - 15  CBC     Status: None   Collection Time: 08/05/15 10:00 AM  Result Value Ref Range   WBC 10.0 4.0 - 10.5 K/uL   RBC 4.69 3.87 - 5.11 MIL/uL   Hemoglobin 13.3 12.0 -  15.0 g/dL   HCT 40.4 36.0 - 46.0 %   MCV 86.1 78.0 - 100.0 fL   MCH 28.4 26.0 - 34.0 pg   MCHC 32.9 30.0 - 36.0 g/dL   RDW 13.7 11.5 - 15.5 %   Platelets 157 150 - 400 K/uL  Protime-INR     Status: None   Collection Time: 08/05/15 10:00 AM  Result Value Ref Range   Prothrombin Time 13.5 11.6 - 15.2 seconds   INR 1.01 0.00 - 1.49  Glucose, capillary     Status: Abnormal   Collection Time: 08/05/15 10:24 AM  Result Value Ref Range   Glucose-Capillary 155 (H) 65 - 99 mg/dL    Imaging: No results found.  Assessment/Plan Headaches Hx of basilar artery aneurysm post intervention in 2012 For cerebral arteriogram today Labs ok Risks and Benefits discussed with the patient including, but not limited to bleeding, infection, vascular injury or contrast induced renal failure. All of the patient's questions were answered, patient is agreeable to proceed. Consent signed and in chart.   Ascencion Dike PA-C 08/05/2015, 11:01 AM

## 2015-08-05 NOTE — Discharge Instructions (Signed)

## 2015-08-05 NOTE — Sedation Documentation (Signed)
Report given to [place], [place]

## 2015-08-05 NOTE — Sedation Documentation (Signed)
Pt complains of headache 

## 2015-08-05 NOTE — Sedation Documentation (Signed)
Vital signs stable. Pt resting 

## 2015-08-05 NOTE — Sedation Documentation (Signed)
Patient is resting comfortably. 

## 2015-08-05 NOTE — Sedation Documentation (Signed)
Pt complains of headache, 8/10

## 2015-08-05 NOTE — Sedation Documentation (Signed)
Patient is resting. Still complains of headache 8/10. Vitals stable

## 2015-08-11 DIAGNOSIS — L308 Other specified dermatitis: Secondary | ICD-10-CM | POA: Diagnosis not present

## 2015-08-11 DIAGNOSIS — L700 Acne vulgaris: Secondary | ICD-10-CM | POA: Diagnosis not present

## 2015-08-28 DIAGNOSIS — M47817 Spondylosis without myelopathy or radiculopathy, lumbosacral region: Secondary | ICD-10-CM | POA: Diagnosis not present

## 2015-08-28 DIAGNOSIS — M47812 Spondylosis without myelopathy or radiculopathy, cervical region: Secondary | ICD-10-CM | POA: Diagnosis not present

## 2015-08-28 DIAGNOSIS — M5030 Other cervical disc degeneration, unspecified cervical region: Secondary | ICD-10-CM | POA: Diagnosis not present

## 2015-08-28 DIAGNOSIS — G894 Chronic pain syndrome: Secondary | ICD-10-CM | POA: Diagnosis not present

## 2015-08-28 DIAGNOSIS — Z79899 Other long term (current) drug therapy: Secondary | ICD-10-CM | POA: Diagnosis not present

## 2015-09-09 DIAGNOSIS — M545 Low back pain: Secondary | ICD-10-CM | POA: Diagnosis not present

## 2015-09-15 DIAGNOSIS — E1042 Type 1 diabetes mellitus with diabetic polyneuropathy: Secondary | ICD-10-CM | POA: Diagnosis not present

## 2015-09-15 DIAGNOSIS — I10 Essential (primary) hypertension: Secondary | ICD-10-CM | POA: Diagnosis not present

## 2015-09-15 DIAGNOSIS — E1165 Type 2 diabetes mellitus with hyperglycemia: Secondary | ICD-10-CM | POA: Diagnosis not present

## 2015-09-15 DIAGNOSIS — N3281 Overactive bladder: Secondary | ICD-10-CM | POA: Diagnosis not present

## 2015-09-15 DIAGNOSIS — F0781 Postconcussional syndrome: Secondary | ICD-10-CM | POA: Diagnosis not present

## 2015-09-15 DIAGNOSIS — F418 Other specified anxiety disorders: Secondary | ICD-10-CM | POA: Diagnosis not present

## 2015-09-15 DIAGNOSIS — F329 Major depressive disorder, single episode, unspecified: Secondary | ICD-10-CM | POA: Diagnosis not present

## 2015-09-15 DIAGNOSIS — E7800 Pure hypercholesterolemia, unspecified: Secondary | ICD-10-CM | POA: Diagnosis not present

## 2015-09-15 DIAGNOSIS — F17200 Nicotine dependence, unspecified, uncomplicated: Secondary | ICD-10-CM | POA: Diagnosis not present

## 2015-09-15 DIAGNOSIS — R609 Edema, unspecified: Secondary | ICD-10-CM | POA: Diagnosis not present

## 2015-09-15 DIAGNOSIS — F411 Generalized anxiety disorder: Secondary | ICD-10-CM | POA: Diagnosis not present

## 2015-09-15 DIAGNOSIS — M545 Low back pain: Secondary | ICD-10-CM | POA: Diagnosis not present

## 2015-09-15 DIAGNOSIS — Z794 Long term (current) use of insulin: Secondary | ICD-10-CM | POA: Diagnosis not present

## 2015-09-15 DIAGNOSIS — K219 Gastro-esophageal reflux disease without esophagitis: Secondary | ICD-10-CM | POA: Diagnosis not present

## 2015-09-15 DIAGNOSIS — I2510 Atherosclerotic heart disease of native coronary artery without angina pectoris: Secondary | ICD-10-CM | POA: Diagnosis not present

## 2015-09-22 DIAGNOSIS — M47812 Spondylosis without myelopathy or radiculopathy, cervical region: Secondary | ICD-10-CM | POA: Diagnosis not present

## 2015-09-22 DIAGNOSIS — G894 Chronic pain syndrome: Secondary | ICD-10-CM | POA: Diagnosis not present

## 2015-09-22 DIAGNOSIS — M47817 Spondylosis without myelopathy or radiculopathy, lumbosacral region: Secondary | ICD-10-CM | POA: Diagnosis not present

## 2015-09-22 DIAGNOSIS — M5030 Other cervical disc degeneration, unspecified cervical region: Secondary | ICD-10-CM | POA: Diagnosis not present

## 2015-10-20 DIAGNOSIS — M545 Low back pain: Secondary | ICD-10-CM | POA: Diagnosis not present

## 2015-10-20 DIAGNOSIS — Z79891 Long term (current) use of opiate analgesic: Secondary | ICD-10-CM | POA: Diagnosis not present

## 2015-10-20 DIAGNOSIS — G894 Chronic pain syndrome: Secondary | ICD-10-CM | POA: Diagnosis not present

## 2015-10-20 DIAGNOSIS — M792 Neuralgia and neuritis, unspecified: Secondary | ICD-10-CM | POA: Diagnosis not present

## 2015-10-20 DIAGNOSIS — Z79899 Other long term (current) drug therapy: Secondary | ICD-10-CM | POA: Diagnosis not present

## 2015-11-05 DIAGNOSIS — Z794 Long term (current) use of insulin: Secondary | ICD-10-CM | POA: Diagnosis not present

## 2015-11-05 DIAGNOSIS — E1065 Type 1 diabetes mellitus with hyperglycemia: Secondary | ICD-10-CM | POA: Diagnosis not present

## 2015-11-05 DIAGNOSIS — D72829 Elevated white blood cell count, unspecified: Secondary | ICD-10-CM | POA: Diagnosis not present

## 2015-11-05 DIAGNOSIS — E041 Nontoxic single thyroid nodule: Secondary | ICD-10-CM | POA: Diagnosis not present

## 2015-11-05 DIAGNOSIS — E7800 Pure hypercholesterolemia, unspecified: Secondary | ICD-10-CM | POA: Diagnosis not present

## 2015-11-05 DIAGNOSIS — I10 Essential (primary) hypertension: Secondary | ICD-10-CM | POA: Diagnosis not present

## 2015-11-05 DIAGNOSIS — E1140 Type 2 diabetes mellitus with diabetic neuropathy, unspecified: Secondary | ICD-10-CM | POA: Diagnosis not present

## 2015-11-12 DIAGNOSIS — E1065 Type 1 diabetes mellitus with hyperglycemia: Secondary | ICD-10-CM | POA: Diagnosis not present

## 2015-11-19 DIAGNOSIS — Z79899 Other long term (current) drug therapy: Secondary | ICD-10-CM | POA: Diagnosis not present

## 2015-11-19 DIAGNOSIS — M792 Neuralgia and neuritis, unspecified: Secondary | ICD-10-CM | POA: Diagnosis not present

## 2015-11-19 DIAGNOSIS — G894 Chronic pain syndrome: Secondary | ICD-10-CM | POA: Diagnosis not present

## 2015-11-19 DIAGNOSIS — Z79891 Long term (current) use of opiate analgesic: Secondary | ICD-10-CM | POA: Diagnosis not present

## 2015-11-19 DIAGNOSIS — M545 Low back pain: Secondary | ICD-10-CM | POA: Diagnosis not present

## 2015-11-20 DIAGNOSIS — I2510 Atherosclerotic heart disease of native coronary artery without angina pectoris: Secondary | ICD-10-CM | POA: Diagnosis not present

## 2015-11-20 DIAGNOSIS — I209 Angina pectoris, unspecified: Secondary | ICD-10-CM | POA: Diagnosis not present

## 2015-11-20 DIAGNOSIS — I10 Essential (primary) hypertension: Secondary | ICD-10-CM | POA: Diagnosis not present

## 2015-11-20 DIAGNOSIS — F17209 Nicotine dependence, unspecified, with unspecified nicotine-induced disorders: Secondary | ICD-10-CM | POA: Diagnosis not present

## 2015-12-05 DIAGNOSIS — I10 Essential (primary) hypertension: Secondary | ICD-10-CM | POA: Diagnosis not present

## 2015-12-05 DIAGNOSIS — I208 Other forms of angina pectoris: Secondary | ICD-10-CM | POA: Diagnosis not present

## 2015-12-05 IMAGING — CR DG CHEST 2V
2 series · 2 of 2 positions shown · non-contrast
Comparison: DG RIBS UNILATERAL W/CHEST*L* dated 03/14/2013

CLINICAL DATA: Left-sided chest and arm pain with difficulty
breathing, history of long-term tobacco use and type 1 diabetes

EXAM:
CHEST  2 VIEW

[w chest pa]
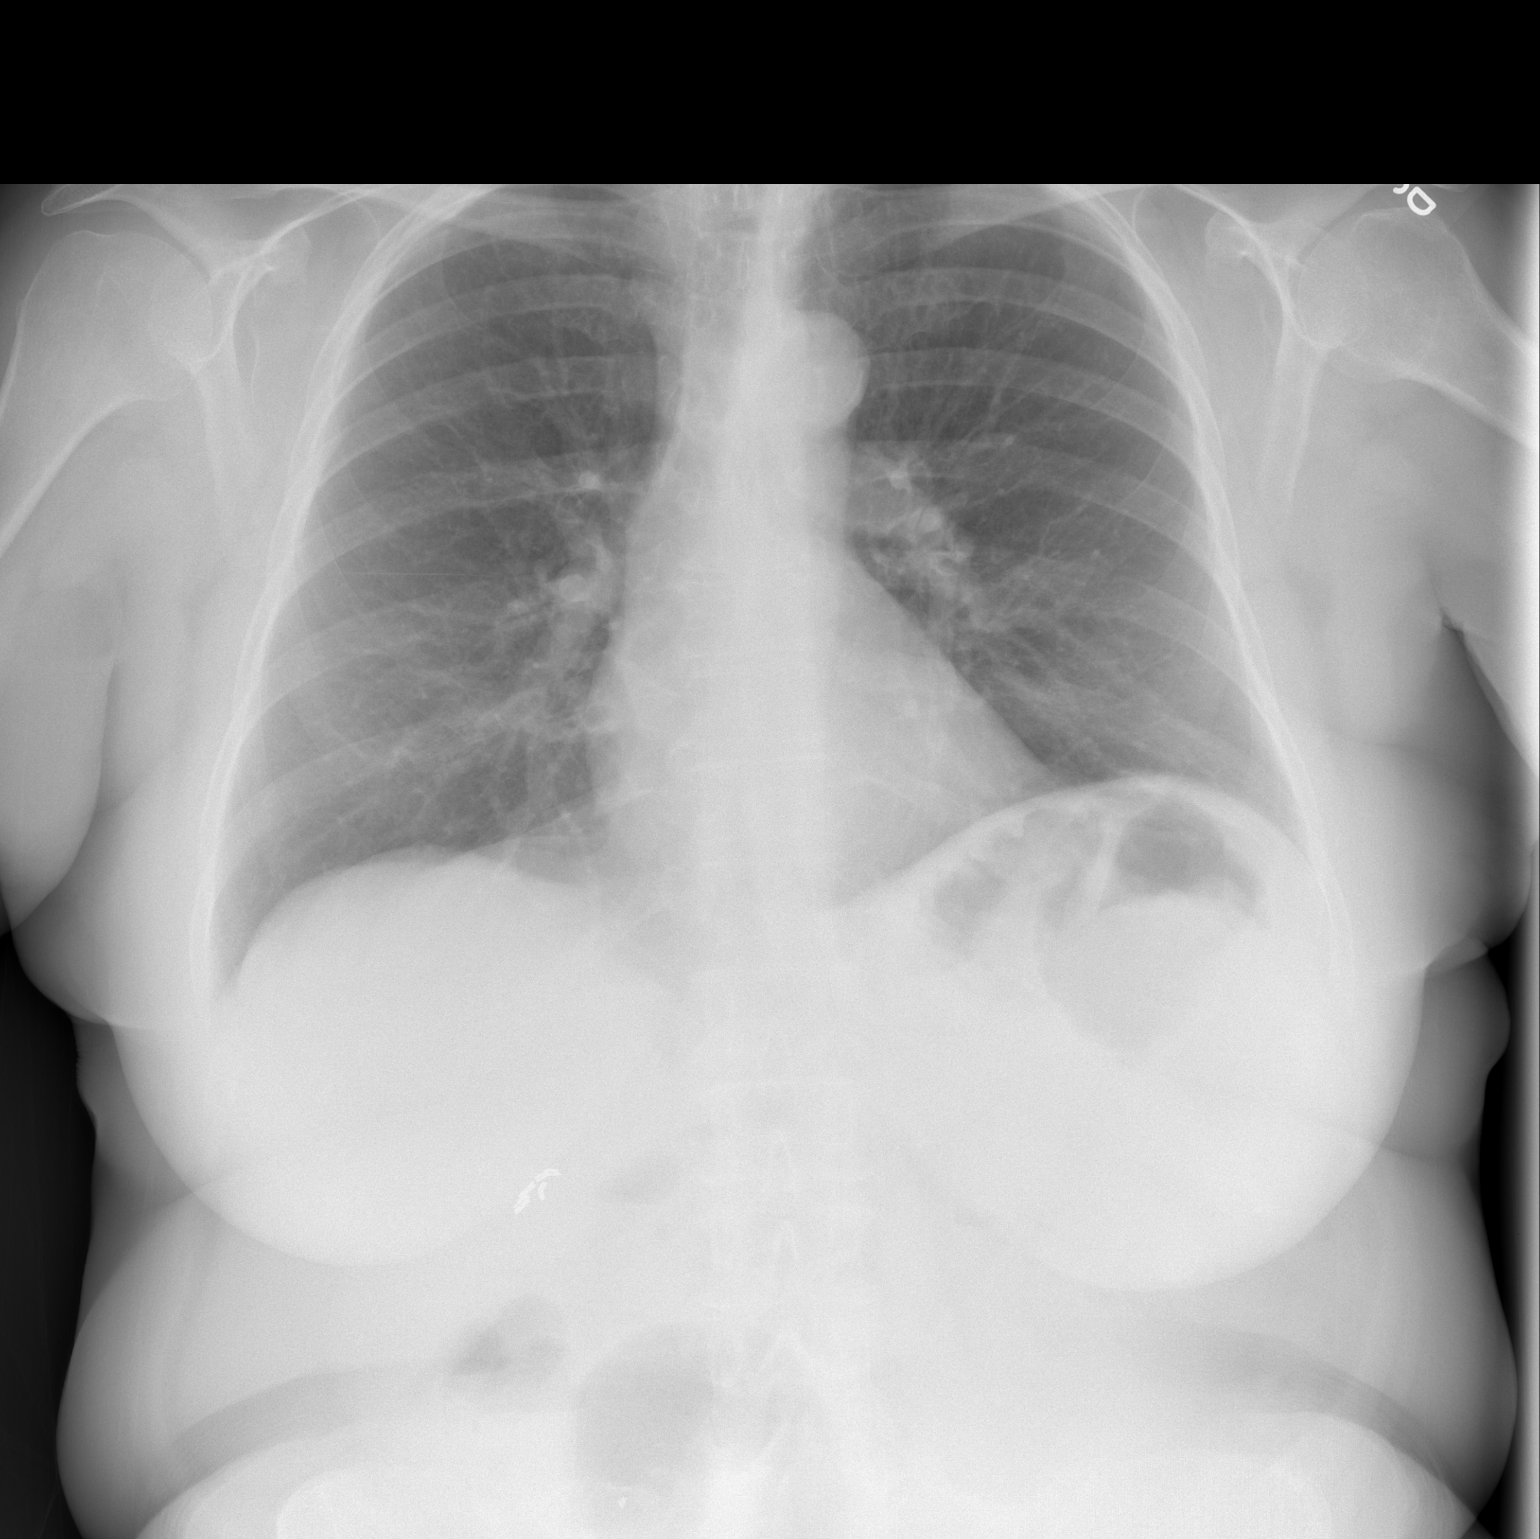

[w chest lat]
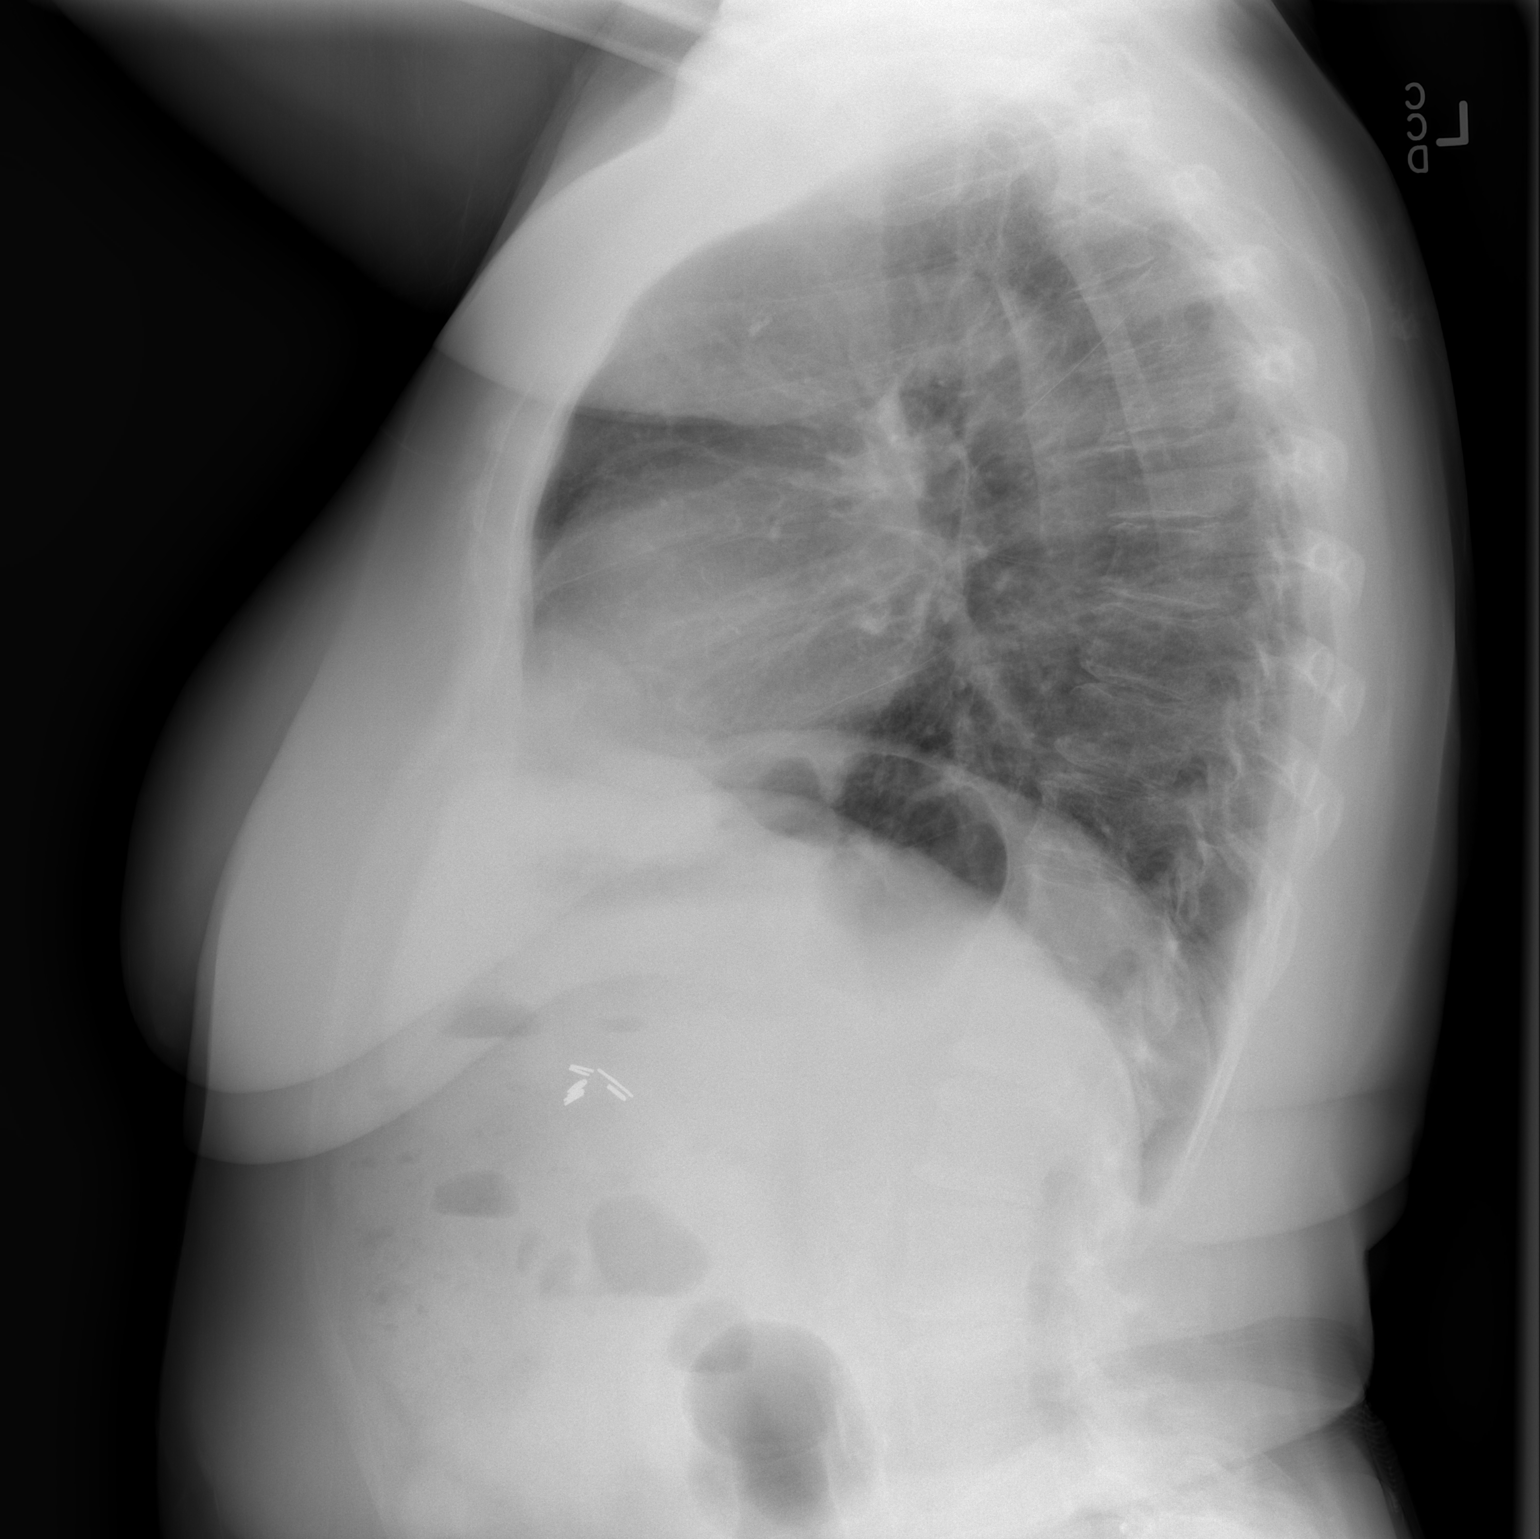

[2 of 2 positions shown; findings below may reference images not displayed]

FINDINGS: The lungs are adequately inflated. There is no focal infiltrate.
There is no pleural effusion or pneumothorax. The cardiac silhouette
is normal in size. The mediastinum is normal in width. The pulmonary
vascularity is not engorged. The trachea is midline. The observed
portions of the bony thorax exhibit no acute abnormalities. There is
mild degenerative disc change at multiple thoracic levels.
IMPRESSION: There is no evidence of pneumonia nor CHF nor other acute
cardiopulmonary disease.

## 2015-12-10 DIAGNOSIS — M545 Low back pain: Secondary | ICD-10-CM | POA: Diagnosis not present

## 2015-12-10 DIAGNOSIS — Z79899 Other long term (current) drug therapy: Secondary | ICD-10-CM | POA: Diagnosis not present

## 2015-12-10 DIAGNOSIS — M792 Neuralgia and neuritis, unspecified: Secondary | ICD-10-CM | POA: Diagnosis not present

## 2015-12-10 DIAGNOSIS — Z79891 Long term (current) use of opiate analgesic: Secondary | ICD-10-CM | POA: Diagnosis not present

## 2015-12-10 DIAGNOSIS — I10 Essential (primary) hypertension: Secondary | ICD-10-CM | POA: Diagnosis not present

## 2015-12-10 DIAGNOSIS — F17209 Nicotine dependence, unspecified, with unspecified nicotine-induced disorders: Secondary | ICD-10-CM | POA: Diagnosis not present

## 2015-12-10 DIAGNOSIS — G894 Chronic pain syndrome: Secondary | ICD-10-CM | POA: Diagnosis not present

## 2015-12-10 DIAGNOSIS — I209 Angina pectoris, unspecified: Secondary | ICD-10-CM | POA: Diagnosis not present

## 2015-12-10 DIAGNOSIS — I2510 Atherosclerotic heart disease of native coronary artery without angina pectoris: Secondary | ICD-10-CM | POA: Diagnosis not present

## 2015-12-18 ENCOUNTER — Encounter: Payer: Medicare Other | Attending: Endocrinology | Admitting: *Deleted

## 2015-12-18 ENCOUNTER — Encounter: Payer: Self-pay | Admitting: *Deleted

## 2015-12-18 VITALS — Ht 61.0 in | Wt 163.6 lb

## 2015-12-18 DIAGNOSIS — IMO0002 Reserved for concepts with insufficient information to code with codable children: Secondary | ICD-10-CM

## 2015-12-18 DIAGNOSIS — E1065 Type 1 diabetes mellitus with hyperglycemia: Secondary | ICD-10-CM

## 2015-12-18 DIAGNOSIS — E1069 Type 1 diabetes mellitus with other specified complication: Secondary | ICD-10-CM | POA: Diagnosis not present

## 2015-12-18 DIAGNOSIS — E108 Type 1 diabetes mellitus with unspecified complications: Secondary | ICD-10-CM

## 2015-12-18 DIAGNOSIS — I209 Angina pectoris, unspecified: Secondary | ICD-10-CM | POA: Diagnosis not present

## 2015-12-18 NOTE — Progress Notes (Signed)
Diabetes Self-Management Education  Visit Type: First/Initial  Appt. Start Time: 1530 Appt. End Time: 1630  12/18/2015  [name], identified by name and date of birth, is a 60 y.o. female with a diagnosis of Diabetes: Type 1.   ASSESSMENT  Height 5\' 1"  (1.549 m), weight 163 lb 9.6 oz (74.208 kg). Body mass index is 30.93 kg/(m^2).      Diabetes Self-Management Education - 12/18/15 1537    Visit Information   Visit Type First/Initial   Initial Visit   Diabetes Type Type 1   Are you taking your medications as prescribed? Yes   Date Diagnosed Mallory Mendez   How would you rate your overall health? Good   Psychosocial Assessment   Patient Belief/Attitude about Diabetes Afraid   Self-care barriers Other (comment)  lack of adequate education about her diabetes for the past 40 years   Other persons present Patient;Family Member   Patient Concerns Nutrition/Meal planning   Preferred Learning Style Auditory;Visual;Hands on   Learning Readiness Other (comment)  interested in feeling better, not sure how to do that   What is the last grade level you completed in school? 12   Pre-Education Assessment   Patient understands the diabetes disease and treatment process. Needs Instruction   Patient understands incorporating nutritional management into lifestyle. Needs Instruction   Patient undertands incorporating physical activity into lifestyle. Needs Review   Patient understands using medications safely. Needs Review   Patient understands monitoring blood glucose, interpreting and using results Needs Review   Patient understands prevention, detection, and treatment of acute complications. Needs Review   Patient understands prevention, detection, and treatment of chronic complications. Needs Review   Patient understands how to develop strategies to address psychosocial issues. Needs Instruction   Patient understands how to develop strategies to promote health/change  behavior. Needs Instruction   Complications   Last HgB A1C per patient/outside source 9.2 %   How often do you check your blood sugar? > 4 times/day   Have you had a dilated eye exam in the past 12 months? No   Have you had a dental exam in the past 12 months? Yes   Are you checking your feet? Yes   How many days per week are you checking your feet? 4   Dietary Intake   Breakfast skips most days unless regualr oatmeal OR biscuits and gravy   Snack (morning) no   Lunch sandwich with liverwurst or [place],    Snack (afternoon) infrequently to help prevent a low BG   Dinner salad with meat sometimes fried, starch    Snack (evening) depends on her BG to prevent low BG   Beverage(s) whole or 1% milk, unsweet   Exercise   Exercise Type Light (walking / raking leaves)  housekeeping activities   How many days per week to you exercise? 5   Patient Education   Previous Diabetes Education Yes (please comment)   Disease state  Definition of diabetes, type 1 and 2, and the diagnosis of diabetes   Nutrition management  Role of diet in the treatment of diabetes and the relationship between the three main macronutrients and blood glucose level;Carbohydrate counting   Monitoring Purpose and frequency of SMBG.   Chronic complications Identified and discussed with patient  current chronic complications  gastroparesis affects her appetite and increases nausea   Individualized Goals (developed by patient)   Nutrition Follow meal plan discussed   Physical Activity Exercise 3-5 times per week  Medications take my medication as prescribed   Monitoring  test my blood glucose as discussed   Post-Education Assessment   Patient understands incorporating nutritional management into lifestyle. Demonstrates understanding / competency   Outcomes   Expected Outcomes Demonstrated interest in learning. Expect positive outcomes   Future DMSE 2 months  patient has some heart procedures coming up, will call to make  follow up appointment after that.   Program Status Not Completed      Individualized Plan for Diabetes Self-Management Training:   Learning Objective:  Patient will have a greater understanding of diabetes self-management. Patient education plan is to attend individual and/or group sessions per assessed needs and concerns.   Plan:   Patient Instructions  Plan:  Aim for 2-3 Carb Choices per meal (30-45 grams)  Aim for 0-2 Carbs per snack if hungry  Include lean protein in moderation with your meals and snacks Continue with your activity daily as tolerated Continue checking BG at alternate times per day  Continue taking medication as directed by MD       Expected Outcomes:  Demonstrated interest in learning. Expect positive outcomes  Education material provided: Living Well with Diabetes, Meal plan card and Carbohydrate counting sheet  If problems or questions, patient to contact team via:  Phone and Email  Future DSME appointment: 2 months (patient has some heart procedures coming up, will call to make follow up appointment after that.)  I also informed her of the DM 1 Support Group.

## 2015-12-18 NOTE — Patient Instructions (Signed)
Plan:  Aim for 2-3 Carb Choices per meal (30-45 grams)  Aim for 0-2 Carbs per snack if hungry  Include lean protein in moderation with your meals and snacks Continue with your activity daily as tolerated Continue checking BG at alternate times per day  Continue taking medication as directed by MD

## 2015-12-22 DIAGNOSIS — I209 Angina pectoris, unspecified: Secondary | ICD-10-CM | POA: Diagnosis present

## 2015-12-22 NOTE — H&P (Signed)
OFFICE VISIT NOTES COPIED TO EPIC FOR DOCUMENTATION  . History of Present Illness Neldon Labella AGNP-C; 01-04-2016 1:07 PM) The patient is a 60 year old female who presents for a follow-up for Hypertension. She has a history of diabetes, hypertension, hyperlipidemia, continued tobacco abuse, and coronary artery disease per coronary angiography on 11/05/2013. She recently presented as a STAT add on for evaluation of intermittent episodes of chest pain with radiation to the left arm and back over the previous 1-2 weeks. Symptoms occur primarily with exertion and are relieved by rest.  She denies any shortness of breath, PND, orthopnea, edema, or symptoms suggestive of claudication or TIA. She also continues to smoke about a pack of cigarettes per day.  She was scheduled for exercise Myoview stress test to evaluate for progression of CAD and presents today for follow-up. She states she is experiencing worsening fatigue and, although she has significantly reduced her activity level, still has an occasional episode of chest pain.    Problem List/Past Medical Loletha Grayer [place]; 01/04/16 11:11 AM) Precordial pain (R07.2)  Controlled type 2 diabetes mellitus without complication, without long-term current use of insulin (E11.9)  Hyperlipidemia, group A (E78.00)  SOB (shortness of breath) (R06.02)  Fibromyalgia (M79.7)  Benign essential hypertension (I10)  Tobacco use disorder, continuous (F17.209)  Anxiety and depression (F41.8)  CAD in native artery (I25.10)  Coronary angiogram 11/05/2013: Normal LVEF. Proximal OM1, mid circumflex 60%, mid RCA 20-30%, mild disease LAD. Lexiscan Myoview stress test 04/06/2013: 1. Resting EKG NSR, Poor R wave progression. Stress EKG was non diagnostic for ischemia. Non specific ST changes noted with pharmacologic stress testing. Stress symptoms included sob and weakness. Stress terminated due to completion of protocol 2. The perfusion study demonstrated normal  isotope uptake both at rest and stress. There was no evidence of ischemia or scar. Dynamic gated images reveal normal wall motion and endocardial thickening. Left ventricular ejection fraction was estimated to be 63%. Echo- 05/03/13: Left ventricular cavity is normal in size. Normal global wall motion. Normal systolic global function. Calculated EF 50%. Visual EF is 55-60%. Doppler evidence of Grade I (impaired) diastolic dysfunction. Left atrial cavity is slightly dilated. Mild aortic leaflet calcification. Trace MR, TR. Angina pectoris (I20.9)  GERD (gastroesophageal reflux disease) (K21.9)  Stroke (I63.9)  Thyroid nodule (E04.1)  History of stroke (U76.54) 1996 History of cerebral aneurysm repair (Y50.35) 2012 Follows Dr. Juliet Rude for the same.  Allergies Loletha Grayer [place]; January 04, 2016 11:11 AM) Cymbalta *ANTIDEPRESSANTS*  Swelling, Vomiting. Latex  Swelling, Itching.  Family History Theora Gianotti; 01-04-2016 11:11 AM) Mother  Deceased. at age 57 from breast cancer; had stroke at age 35 Father  Deceased. at age 66 from [place] (no known heart conditions) Sister 2  Younger; no known heart conditions Brother 2  1-Younger; 1-Older; no known heart conditions  Social History Theora Gianotti; 2016-01-04 11:11 AM) Current tobacco use  Current every day smoker. 1 1/2 PPD Non Drinker/No Alcohol Use  Marital status  Separated. Number of Children  5. Living Situation  Lives with Brothers  Past Surgical History Theora Gianotti; 01-04-2016 11:11 AM) Cholecystectomy 1984 Hysterectomy (not due to cancer) - Partial 1994 Spinal Fusion 2006 6 times EYE SURGERY 2010 Bilateral. INCISIONAL HERNIA REPAIR 05/2011 4 times  Medication History Theora Gianotti; 2016-01-04 11:15 AM) Metoprolol Succinate ER (50MG Tablet ER 24HR, 1 (one) Tablet Tablet Oral daily, Taken starting 11/20/2015) Active. (**Increase from 14m**) Chlorthalidone (25MG Tablet, 1 (one) Tablet Oral daily, Taken starting 11/20/2015)  Active. (**Disregard 584mRx**) Aspirin Adult Low  Strength (81MG Tablet Chewable, 1 (one) Tablet Chewable Tablet Oral daily, Taken starting 11/01/2013) Active. Nitrolingual (0.4MG/DOSE Aerosol Soln, 1 Translingual every 5 minutes as needed for chest pain., Taken starting 10/31/2013) Active. Opana ER (20MG Tablet ER 12HR, 1 Oral every 12 hours) Active. ALPRAZolam (1MG Tablet, 1 Oral three times daily as needed) Active. Torsemide (20MG Tablet, 1 Oral daily) Active. Atorvastatin Calcium (40MG Tablet, 1 Oral daily) Active. AmLODIPine Besylate (10MG Tablet, 1 Oral daily) Active. Estradiol (1MG Tablet, 1 Oral daily) Active. Imipramine HCl (50MG Tablet, 1 Oral daily) Active. Methocarbamol (500MG Tablet, 1 Oral two times daily) Active. NexIUM (40MG Capsule DR, 1 Oral daily) Active. Topiramate (100MG Tablet, 1 1/2 Oral daily) Active. Zolpidem Tartrate ER (12.5MG Tablet ER, 1 Oral daily) Active. Amitiza (24MCG Capsule, 1 Oral two times daily) Active. Pristiq (100MG Tablet ER 24HR, 1 Oral daily) Active. Gabapentin (600MG Tablet, 1 Oral three times daily) Active. Pantoprazole Sodium (40MG Tablet DR, 1 Oral daily) Active. Benicar (40MG Tablet, 1 Oral daily) Active. Ibuprofen (800MG Tablet, 1 Oral as needed) Active. Baclofen (10MG Tablet, 1 Oral daily) Active. Lantus SoloStar (100UNIT/ML Soln Pen-inj, 30 units Subcutaneous daily) Active. Myrbetriq (25MG Tablet ER 24HR, 1 Oral daily) Active. NovoLOG FlexPen (100UNIT/ML Soln Pen-inj, Subcutaneous sliding scale) Active. Meclizine HCl (25MG Tablet, 1 Oral daily) Active. Medications Reconciled (verbally with patient)  Diagnostic Studies History Anderson Malta Sergeant; 12/10/2015 8:20 AM) Nuclear stress test 12/05/2015 1. The resting electrocardiogram demonstrated normal sinus rhythm, normal resting conduction and no resting arrhythmias. The stress electrocardiogram was abnormal. The stress electrocardiogram revealed 2 mm of horizontal ST  depression in lead (s):II, III, aVF, V5, V6 consistent with myocardial ischemia. The ST segments were back to baseline immediately into recovery however there was reappearance of ST depression at 3 minutes into recovery. There was also occasional PVCs. Stress symptoms included dyspnea. Patient exercised on Bruce protocol for 5.0 minutes and achieved 4.64 METS. Stress test terminated due to target heart rate( 1.1% MPHR) and dyspnea. 2. Left ventricular cavity is noted to be enlarged on the stress study. The TID index was 1.41. SPECT images demonstrate homogeneous tracer distribution throughout the myocardium. Gated SPECT images reveal normal myocardial thickening and wall motion. The left ventricular ejection fraction was calculated or visually estimated to be 63%. This is an intermediate risk study, clinical correlation recommended. This is a high risk study in view of abnormal EKG response and transient ischemic dilatation of LV noted on stress images. clinical correlation recommended.    Review of Systems (Bridgette Ebony Hail AGNP-C; 12/10/2015 1:07 PM) General Present- Fatigue. Not Present- Anorexia and Fever. Respiratory Not Present- Cough, Decreased Exercise Tolerance, Difficulty Breathing on Exertion and Dyspnea. Cardiovascular Present- Chest Pain. Not Present- Claudications, Edema, Orthopnea, Palpitations and Paroxysmal Nocturnal Dyspnea. Gastrointestinal Not Present- Black, Tarry Stool, Change in Bowel Habits and Nausea. Neurological Not Present- Focal Neurological Symptoms and Syncope. Endocrine Not Present- Cold Intolerance, Excessive Sweating, Heat Intolerance and Thyroid Problems. Hematology Not Present- Anemia, Easy Bruising, Petechiae and Prolonged Bleeding.  Vitals Loletha Grayer [place]; 12/10/2015 11:21 AM) 12/10/2015 11:11 AM Weight: 160.56 lb Height: 65in Body Surface Area: 1.8 m Body Mass Index: 26.72 kg/m  Pulse: 76 (Regular)  P.OX: 99% (Room air) BP: 132/82 (Sitting, Left Arm,  Standard)       Physical Exam (Bridgette Ebony Hail, AGNP-C; 12/10/2015 1:7 PM) General Mental Status-Alert. General Appearance-Cooperative and Appears stated age. Build & Nutrition-Moderately built.  Head and Neck Thyroid Gland Characteristics - normal size and consistency and no palpable nodules.  Chest and Lung Exam  Chest and lung exam reveals -quiet, even and easy respiratory effort with no use of accessory muscles, non-tender and on auscultation, normal breath sounds, no adventitious sounds.  Cardiovascular Cardiovascular examination reveals -normal heart sounds, regular rate and rhythm with no murmurs, carotid auscultation reveals no bruits, abdominal aorta auscultation reveals no bruits and no prominent pulsation, femoral artery auscultation bilaterally reveals normal pulses, no bruits, no thrills, normal pedal pulses bilaterally and no digital clubbing, cyanosis, edema, increased warmth or tenderness.  Abdomen Palpation/Percussion Normal exam - Non Tender and No hepatosplenomegaly.  Neurologic Neurologic evaluation reveals -alert and oriented x 3 with no impairment of recent or remote memory. Motor-Grossly intact without any focal deficits.  Musculoskeletal Global Assessment Left Lower Extremity - no deformities, masses or tenderness, no known fractures. Right Lower Extremity - no deformities, masses or tenderness, no known fractures.   Results (Devonna Shumate; 12/10/2015 12:03 PM) Labs  Name Value Range Date METABOLIC PANEL, BASIC (75102)   Collected: 12/05/2015 2:45 PM Glucose, Serum 426 mg/dL 65-99 mg/dL Collected: 12/05/2015 2:45 PM BUN 19 mg/dL 8-27 mg/dL Collected: 12/05/2015 2:45 PM Creatinine, Serum 0.58 mg/dL 0.57-1.00 mg/dL Collected: 12/05/2015 2:45 PM eGFR If NonAfricn Am 100 mL/min/1.73 >59 mL/min/1.73 Collected: 12/05/2015 2:45 PM eGFR If Africn Am 115 mL/min/1.73 >59 mL/min/1.73 Collected: 12/05/2015  2:45 PM BUN/Creatinine Ratio 33 12-28 Collected: 12/05/2015 2:45 PM Sodium, Serum 137 mmol/L 134-144 mmol/L Collected: 12/05/2015 2:45 PM Potassium, Serum 4.3 mmol/L 3.5-5.2 mmol/L Collected: 12/05/2015 2:45 PM Chloride, Serum 95 mmol/L 96-106 mmol/L Collected: 12/05/2015 2:45 PM Carbon Dioxide, Total 29 mmol/L 18-29 mmol/L Collected: 12/05/2015 2:45 PM Calcium, Serum 9.3 mg/dL 8.7-10.3 mg/dL Collected: 12/05/2015 2:45 PM    Assessment & Plan (Devonna Shumate; 12/10/2015 12:03 PM) Angina pectoris (I20.9) Story: Exercise myoview stress 12/05/2015: 1. The resting electrocardiogram demonstrated normal sinus rhythm, normal resting conduction and no resting arrhythmias. The stress electrocardiogram was abnormal. The stress electrocardiogram revealed 2 mm of horizontal ST depression in lead (s):II, III, aVF, V5, V6 consistent with myocardial ischemia. The ST segments were back to baseline immediately into recovery however there was reappearance of ST depression at 3 minutes into recovery. There was also occasional PVCs. Stress symptoms included dyspnea. Patient exercised on Bruce protocol for 5.0 minutes and achieved 4.64 METS. Stress test terminated due to target heart rate( 1.1% MPHR) and dyspnea. 2. Left ventricular cavity is noted to be enlarged on the stress study. The TID index was 1.41. SPECT images demonstrate homogeneous tracer distribution throughout the myocardium. Gated SPECT images reveal normal myocardial thickening and wall motion. The left ventricular ejection fraction was calculated or visually estimated to be 63%. This is an intermediate risk study, clinical correlation recommended. This is a high risk study in view of abnormal EKG response and transient ischemic dilatation of LV noted on stress images. clinical correlation recommended. Impression: EKG 11/20/2015: Sinus rhythm at a rate of 69 bpm, normal axis, left atrial abnormality, no  evidence of ischemia. Future Plans 5/85/2778: METABOLIC PANEL, BASIC (24235) - one time 12/22/2015: CBC & PLATELETS (AUTO) (36144) - one time 12/22/2015: PT (PROTHROMBIN TIME) (31540) - one time CAD in native artery (I25.10) Story: Coronary angiogram 11/05/2013: Normal LVEF. Proximal OM1, mid circumflex 60%, mid RCA 20-30%, mild disease LAD.  Echo- 05/03/13: Left ventricular cavity is normal in size. Normal global wall motion. Normal systolic global function. Calculated EF 50%. Visual EF is 55-60%. Doppler evidence of Grade I (impaired) diastolic dysfunction. Left atrial cavity is slightly dilated. Mild aortic leaflet calcification. Trace MR, TR.  Tobacco use  disorder, continuous (F17.209)   Current Plans Mechanism of underlying disease process and action of medications discussed with the patient. I discussed primary/secondary prevention and also dietary counseling was done. She presents for follow-up of recent nuclear stress test for symptoms suggestive of angina. Stress test abnormal with 2 mm horizontal ST depression in inferior and lateral leads which resolved immediately into recovery, however reoccurred at 3 minutes into recovery. LV cavity also noted to be enlarged at stress, however with preserved EF and homogenous tracer distribution throughout the myocardium. She reports worsening fatigue and continues to experience intermittent episodes of chest discomfort. Given symptoms and abnormal stress test as well as significant risk factors for progression of CAD including diabetes and continued tobacco use, would recommend coronary angiogram for reevaluation of coronary anatomy. Schedule for cardiac catheterization, and possible angioplasty. We discussed regarding risks, benefits, alternatives to this including stress testing, CTA and continued medical therapy. Patient wants to proceed. Understands <1-2% risk of death, stroke, MI, urgent CABG, bleeding, infection, renal failure but not limited to these.  Follow up after cath for reevaluation and further recommendations.  *I have discussed this case with Dr. Einar Gip and he participated in formulating the plan.*  Addendum Note(Bridgette Allison AGNP-C; 12/22/2015 4:41 PM) 12/18/2015: Glucose 259, creatinine 0.57, potassium 4.2, CBC normal, PT/INR normal  Labs stable to proceed with coronary angiogram.  Signed by Neldon Labella, AGNP-C (12/10/2015 1:07 PM)

## 2015-12-23 ENCOUNTER — Other Ambulatory Visit: Payer: Self-pay

## 2015-12-23 ENCOUNTER — Encounter (HOSPITAL_COMMUNITY): Payer: Self-pay | Admitting: Cardiology

## 2015-12-23 ENCOUNTER — Ambulatory Visit (HOSPITAL_COMMUNITY)
Admission: RE | Admit: 2015-12-23 | Discharge: 2015-12-23 | Disposition: A | Discharge status: Home / Self Care | Payer: Medicare Other | Source: Ambulatory Visit | Attending: Cardiology | Admitting: Cardiology

## 2015-12-23 ENCOUNTER — Encounter (HOSPITAL_COMMUNITY)
Admission: RE | Disposition: A | Discharge status: Home / Self Care | Payer: Self-pay | Source: Ambulatory Visit | Attending: Cardiology

## 2015-12-23 DIAGNOSIS — K219 Gastro-esophageal reflux disease without esophagitis: Secondary | ICD-10-CM | POA: Insufficient documentation

## 2015-12-23 DIAGNOSIS — I10 Essential (primary) hypertension: Secondary | ICD-10-CM | POA: Diagnosis not present

## 2015-12-23 DIAGNOSIS — F418 Other specified anxiety disorders: Secondary | ICD-10-CM | POA: Insufficient documentation

## 2015-12-23 DIAGNOSIS — Z8673 Personal history of transient ischemic attack (TIA), and cerebral infarction without residual deficits: Secondary | ICD-10-CM | POA: Diagnosis not present

## 2015-12-23 DIAGNOSIS — I25118 Atherosclerotic heart disease of native coronary artery with other forms of angina pectoris: Secondary | ICD-10-CM | POA: Insufficient documentation

## 2015-12-23 DIAGNOSIS — Z794 Long term (current) use of insulin: Secondary | ICD-10-CM | POA: Insufficient documentation

## 2015-12-23 DIAGNOSIS — E119 Type 2 diabetes mellitus without complications: Secondary | ICD-10-CM | POA: Diagnosis not present

## 2015-12-23 DIAGNOSIS — I2510 Atherosclerotic heart disease of native coronary artery without angina pectoris: Secondary | ICD-10-CM | POA: Diagnosis present

## 2015-12-23 DIAGNOSIS — R072 Precordial pain: Secondary | ICD-10-CM | POA: Diagnosis not present

## 2015-12-23 DIAGNOSIS — M797 Fibromyalgia: Secondary | ICD-10-CM | POA: Diagnosis not present

## 2015-12-23 DIAGNOSIS — Z7982 Long term (current) use of aspirin: Secondary | ICD-10-CM | POA: Insufficient documentation

## 2015-12-23 DIAGNOSIS — E785 Hyperlipidemia, unspecified: Secondary | ICD-10-CM | POA: Insufficient documentation

## 2015-12-23 DIAGNOSIS — I209 Angina pectoris, unspecified: Secondary | ICD-10-CM | POA: Diagnosis present

## 2015-12-23 DIAGNOSIS — F17210 Nicotine dependence, cigarettes, uncomplicated: Secondary | ICD-10-CM | POA: Diagnosis not present

## 2015-12-23 HISTORY — PX: CARDIAC CATHETERIZATION: SHX172

## 2015-12-23 LAB — GLUCOSE, CAPILLARY
GLUCOSE-CAPILLARY: 245 mg/dL — AB (ref 65–99)
GLUCOSE-CAPILLARY: 215 mg/dL — AB (ref 65–99)

## 2015-12-23 SURGERY — LEFT HEART CATH AND CORONARY ANGIOGRAPHY

## 2015-12-23 MED ORDER — HEPARIN (PORCINE) IN NACL 2-0.9 UNIT/ML-% IJ SOLN
INTRAMUSCULAR | Status: DC | PRN
  Administered 2015-12-23: 1000 mL

## 2015-12-23 MED ORDER — NITROGLYCERIN 1 MG/10 ML FOR IR/CATH LAB
INTRA_ARTERIAL | Status: AC
  Filled 2015-12-23: qty 10

## 2015-12-23 MED ORDER — SODIUM CHLORIDE 0.9% FLUSH
3.0000 mL | Freq: Two times a day (BID) | INTRAVENOUS | Status: DC
Start: 2015-12-23 — End: 2015-12-23

## 2015-12-23 MED ORDER — VERAPAMIL HCL 2.5 MG/ML IV SOLN
INTRA_ARTERIAL | Status: DC | PRN
  Administered 2015-12-23: 5 mL via INTRA_ARTERIAL

## 2015-12-23 MED ORDER — SODIUM CHLORIDE 0.9 % WEIGHT BASED INFUSION: 3.0000 mL/kg/h | INTRAVENOUS | Status: DC

## 2015-12-23 MED ORDER — IOPAMIDOL (ISOVUE-370) INJECTION 76%
INTRAVENOUS | Status: DC | PRN
  Administered 2015-12-23: 35 mL via INTRA_ARTERIAL

## 2015-12-23 MED ORDER — HEPARIN SODIUM (PORCINE) 1000 UNIT/ML IJ SOLN
INTRAMUSCULAR | Status: DC | PRN
  Administered 2015-12-23: 4000 [IU] via INTRAVENOUS

## 2015-12-23 MED ORDER — LIDOCAINE HCL (PF) 1 % IJ SOLN
INTRAMUSCULAR | Status: DC | PRN
  Administered 2015-12-23: 2 mL via INTRADERMAL

## 2015-12-23 MED ORDER — HYDROMORPHONE HCL 1 MG/ML IJ SOLN
INTRAMUSCULAR | Status: DC | PRN
  Administered 2015-12-23: 0.5 mg via INTRAVENOUS

## 2015-12-23 MED ORDER — VERAPAMIL HCL 2.5 MG/ML IV SOLN
INTRAVENOUS | Status: AC
  Filled 2015-12-23: qty 2

## 2015-12-23 MED ORDER — HEPARIN (PORCINE) IN NACL 2-0.9 UNIT/ML-% IJ SOLN
INTRAMUSCULAR | Status: AC
  Filled 2015-12-23: qty 1000

## 2015-12-23 MED ORDER — SODIUM CHLORIDE 0.9% FLUSH: 3.0000 mL | INTRAVENOUS | Status: DC | PRN

## 2015-12-23 MED ORDER — SODIUM CHLORIDE 0.9 % IV SOLN: 250.0000 mL | INTRAVENOUS | Status: DC | PRN

## 2015-12-23 MED ORDER — IOPAMIDOL (ISOVUE-370) INJECTION 76%
INTRAVENOUS | Status: AC
  Filled 2015-12-23: qty 100

## 2015-12-23 MED ORDER — ASPIRIN 81 MG PO CHEW
81.0000 mg | CHEWABLE_TABLET | ORAL | Status: AC
  Administered 2015-12-23: 81 mg via ORAL

## 2015-12-23 MED ORDER — LIDOCAINE HCL (PF) 1 % IJ SOLN
INTRAMUSCULAR | Status: AC
  Filled 2015-12-23: qty 30

## 2015-12-23 MED ORDER — SODIUM CHLORIDE 0.9 % WEIGHT BASED INFUSION: 1.0000 mL/kg/h | INTRAVENOUS | Status: DC

## 2015-12-23 MED ORDER — SODIUM CHLORIDE 0.9% FLUSH: 3.0000 mL | Freq: Two times a day (BID) | INTRAVENOUS | Status: DC

## 2015-12-23 MED ORDER — HYDROMORPHONE HCL 1 MG/ML IJ SOLN
INTRAMUSCULAR | Status: AC
  Filled 2015-12-23: qty 1

## 2015-12-23 MED ORDER — MIDAZOLAM HCL 2 MG/2ML IJ SOLN
INTRAMUSCULAR | Status: AC
  Filled 2015-12-23: qty 2

## 2015-12-23 MED ORDER — SODIUM CHLORIDE 0.9 % WEIGHT BASED INFUSION
3.0000 mL/kg/h | INTRAVENOUS | Status: DC
  Administered 2015-12-23: 3 mL/kg/h via INTRAVENOUS

## 2015-12-23 MED ORDER — MIDAZOLAM HCL 2 MG/2ML IJ SOLN
INTRAMUSCULAR | Status: DC | PRN
  Administered 2015-12-23: 2 mg via INTRAVENOUS

## 2015-12-23 MED ORDER — ASPIRIN 81 MG PO CHEW
CHEWABLE_TABLET | ORAL | Status: AC
  Filled 2015-12-23: qty 1

## 2015-12-23 MED ORDER — HEPARIN SODIUM (PORCINE) 1000 UNIT/ML IJ SOLN
INTRAMUSCULAR | Status: AC
  Filled 2015-12-23: qty 1

## 2015-12-23 SURGICAL SUPPLY — 8 items
CATH OPTITORQUE TIG 4.0 5F (CATHETERS) ×1 IMPLANT
DEVICE RAD COMP TR BAND LRG (VASCULAR PRODUCTS) ×1 IMPLANT
GLIDESHEATH SLEND A-KIT 6F 20G (SHEATH) ×1 IMPLANT
KIT HEART LEFT (KITS) ×2 IMPLANT
PACK CARDIAC CATHETERIZATION (CUSTOM PROCEDURE TRAY) ×2 IMPLANT
TRANSDUCER W/STOPCOCK (MISCELLANEOUS) ×2 IMPLANT
TUBING CIL FLEX 10 FLL-RA (TUBING) ×2 IMPLANT
WIRE SAFE-T 1.5MM-J .035X260CM (WIRE) ×1 IMPLANT

## 2015-12-23 NOTE — Interval H&P Note (Signed)
History and Physical Interval Note:  12/23/2015 6:31 AM  Mallory Mendez  has presented today for surgery, with the diagnosis of abnormal stress - angina  The various methods of treatment have been discussed with the patient and family. After consideration of risks, benefits and other options for treatment, the patient has consented to  Procedure(s): Left Heart Cath and Coronary Angiography (N/A) and possible PTCA  as a surgical intervention .  The patient's history has been reviewed, patient examined, no change in status, stable for surgery.  I have reviewed the patient's chart and labs.  Questions were answered to the patient's satisfaction.   Ischemic Symptoms? CCS II (Slight limitation of ordinary activity) Anti-ischemic Medical Therapy? Maximal Medical Therapy (2 or more classes of medications) Non-invasive Test Results? High-risk stress test findings: cardiac mortality >3%/yr Prior CABG? No Previous CABG   Patient Information:   1-2V CAD, no prox LAD  A (8)  Indication: 19; Score: 8   Patient Information:   CTO of 1 vessel, no other CAD  A (7)  Indication: 29; Score: 7   Patient Information:   1V CAD with prox LAD  A (9)  Indication: 35; Score: 9   Patient Information:   2V-CAD with prox LAD  A (9)  Indication: 41; Score: 9   Patient Information:   3V-CAD without LMCA  A (9)  Indication: 47; Score: 9   Patient Information:   3V-CAD without LMCA With Abnormal LV systolic function  A (9)  Indication: 48; Score: 9   Patient Information:   LMCA-CAD  A (9)  Indication: 49; Score: 9   Patient Information:   2V-CAD with prox LAD PCI  A (7)  Indication: 62; Score: 7   Patient Information:   2V-CAD with prox LAD CABG  A (8)  Indication: 62; Score: 8   Patient Information:   3V-CAD without LMCA With Low CAD burden(i.e., 3 focal stenoses, low SYNTAX score) PCI  A (7)  Indication: 63; Score: 7   Patient Information:   3V-CAD without  LMCA With Low CAD burden(i.e., 3 focal stenoses, low SYNTAX score) CABG  A (9)  Indication: 63; Score: 9   Patient Information:   3V-CAD without LMCA E06c - Intermediate-high CAD burden (i.e., multiple diffuse lesions, presence of CTO, or high SYNTAX score) PCI  U (4)  Indication: 64; Score: 4   Patient Information:   3V-CAD without LMCA E06c - Intermediate-high CAD burden (i.e., multiple diffuse lesions, presence of CTO, or high SYNTAX score) CABG  A (9)  Indication: 64; Score: 9   Patient Information:   LMCA-CAD With Isolated LMCA stenosis  PCI  U (6)  Indication: 65; Score: 6   Patient Information:   LMCA-CAD With Isolated LMCA stenosis  CABG  A (9)  Indication: 65; Score: 9   Patient Information:   LMCA-CAD Additional CAD, low CAD burden (i.e., 1- to 2-vessel additional involvement, low SYNTAX score) PCI  U (5)  Indication: 66; Score: 5   Patient Information:   LMCA-CAD Additional CAD, low CAD burden (i.e., 1- to 2-vessel additional involvement, low SYNTAX score) CABG  A (9)  Indication: 66; Score: 9   Patient Information:   LMCA-CAD Additional CAD, intermediate-high CAD burden (i.e., 3-vessel involvement, presence of CTO, or high SYNTAX score) PCI  I (3)  Indication: 67; Score: 3   Patient Information:   LMCA-CAD Additional CAD, intermediate-high CAD burden (i.e., 3-vessel involvement, presence of CTO, or high SYNTAX score) CABG  A (9)  Indication:  67; Score: 9   Mallory Mendez

## 2015-12-23 NOTE — Discharge Instructions (Signed)
Excuse from Work, [employer], or Physical Activity __________________________________________________ needs to be excused from: _____ Work _____ [employer] _____ Physical activity beginning now and through the following date: ____________________. _____ He or she may return to work or school but should still avoid the following physical activity or activities from now until ____________________. Activity restrictions include: _____ Lifting more than _______ lb _____ Sitting longer than __________ minutes at a time _____ Standing longer than ________ minutes at a time _____ He or she may return to full physical activity as of ____________________. Health Care Provider Name (printed): ________________________________________  Health Care Provider (signature): ___________________________________________ Date: ________________   This information is not intended to replace advice given to you by your health care provider. Make sure you discuss any questions you have with your health care provider.   Document Released: 12/15/2000 Document Revised: 07/12/2014 Document Reviewed: 01/21/2014 Elsevier Interactive Patient Education 2016 [place] Refer to this sheet in the next few weeks. These instructions provide you with information about caring for yourself after your procedure. Your health care provider may also give you more specific instructions. Your treatment has been planned according to current medical practices, but problems sometimes occur. Call your health care provider if you have any problems or questions after your procedure. WHAT TO EXPECT AFTER THE PROCEDURE After your procedure, it is typical to have the following:  Bruising at the radial site that usually fades within 1-2 weeks.  Blood collecting in the tissue (hematoma) that may be painful to the touch. It should usually decrease in size and tenderness within 1-2 weeks. HOME CARE INSTRUCTIONS  Take medicines only  as directed by your health care provider.  You may shower 24-48 hours after the procedure or as directed by your health care provider. Remove the bandage (dressing) and gently wash the site with plain soap and water. Pat the area dry with a clean towel. Do not rub the site, because this may cause bleeding.  Do not take baths, swim, or use a hot tub until your health care provider approves.  Check your insertion site every day for redness, swelling, or drainage.  Do not apply powder or lotion to the site.  Do not flex or bend the affected arm for 24 hours or as directed by your health care provider.  Do not push or pull heavy objects with the affected arm for 24 hours or as directed by your health care provider.  Do not lift over 10 lb (4.5 kg) for 5 days after your procedure or as directed by your health care provider.  Ask your health care provider when it is okay to:  Return to work or school.  Resume usual physical activities or sports.  Resume sexual activity.  Do not drive home if you are discharged the same day as the procedure. Have someone else drive you.  You may drive 24 hours after the procedure unless otherwise instructed by your health care provider.  Do not operate machinery or power tools for 24 hours after the procedure.  If your procedure was done as an outpatient procedure, which means that you went home the same day as your procedure, a responsible adult should be with you for the first 24 hours after you arrive home.  Keep all follow-up visits as directed by your health care provider. This is important. SEEK MEDICAL CARE IF:  You have a fever.  You have chills.  You have increased bleeding from the radial site. Hold pressure on the  site. SEEK IMMEDIATE MEDICAL CARE IF:  You have unusual pain at the radial site.  You have redness, warmth, or swelling at the radial site.  You have drainage (other than a small amount of blood on the dressing) from the  radial site.  The radial site is bleeding, and the bleeding does not stop after 30 minutes of holding steady pressure on the site.  Your arm or hand becomes pale, cool, tingly, or numb.   This information is not intended to replace advice given to you by your health care provider. Make sure you discuss any questions you have with your health care provider.   Document Released: 07/24/2010 Document Revised: 07/12/2014 Document Reviewed: 01/07/2014 Elsevier Interactive Patient Education [employer].

## 2016-01-08 DIAGNOSIS — M5137 Other intervertebral disc degeneration, lumbosacral region: Secondary | ICD-10-CM | POA: Diagnosis not present

## 2016-01-08 DIAGNOSIS — M47817 Spondylosis without myelopathy or radiculopathy, lumbosacral region: Secondary | ICD-10-CM | POA: Diagnosis not present

## 2016-01-08 DIAGNOSIS — Z79891 Long term (current) use of opiate analgesic: Secondary | ICD-10-CM | POA: Diagnosis not present

## 2016-01-08 DIAGNOSIS — Z79899 Other long term (current) drug therapy: Secondary | ICD-10-CM | POA: Diagnosis not present

## 2016-01-08 DIAGNOSIS — G894 Chronic pain syndrome: Secondary | ICD-10-CM | POA: Diagnosis not present

## 2016-01-08 DIAGNOSIS — M5030 Other cervical disc degeneration, unspecified cervical region: Secondary | ICD-10-CM | POA: Diagnosis not present

## 2016-01-12 DIAGNOSIS — I209 Angina pectoris, unspecified: Secondary | ICD-10-CM | POA: Diagnosis not present

## 2016-01-12 DIAGNOSIS — I2510 Atherosclerotic heart disease of native coronary artery without angina pectoris: Secondary | ICD-10-CM | POA: Diagnosis not present

## 2016-01-12 DIAGNOSIS — F17209 Nicotine dependence, unspecified, with unspecified nicotine-induced disorders: Secondary | ICD-10-CM | POA: Diagnosis not present

## 2016-01-12 DIAGNOSIS — I10 Essential (primary) hypertension: Secondary | ICD-10-CM | POA: Diagnosis not present

## 2016-01-12 DIAGNOSIS — E1140 Type 2 diabetes mellitus with diabetic neuropathy, unspecified: Secondary | ICD-10-CM | POA: Diagnosis not present

## 2016-01-12 DIAGNOSIS — E7800 Pure hypercholesterolemia, unspecified: Secondary | ICD-10-CM | POA: Diagnosis not present

## 2016-01-12 DIAGNOSIS — E1065 Type 1 diabetes mellitus with hyperglycemia: Secondary | ICD-10-CM | POA: Diagnosis not present

## 2016-01-15 ENCOUNTER — Emergency Department (HOSPITAL_COMMUNITY): Payer: Medicare Other

## 2016-01-15 ENCOUNTER — Encounter (HOSPITAL_COMMUNITY): Payer: Self-pay | Admitting: *Deleted

## 2016-01-15 ENCOUNTER — Emergency Department (HOSPITAL_COMMUNITY)
Admission: EM | Admit: 2016-01-15 | Discharge: 2016-01-15 | Disposition: A | Discharge status: Home / Self Care | Payer: Medicare Other | Attending: Emergency Medicine | Admitting: Emergency Medicine

## 2016-01-15 DIAGNOSIS — F17210 Nicotine dependence, cigarettes, uncomplicated: Secondary | ICD-10-CM | POA: Diagnosis not present

## 2016-01-15 DIAGNOSIS — Y999 Unspecified external cause status: Secondary | ICD-10-CM | POA: Diagnosis not present

## 2016-01-15 DIAGNOSIS — S61511A Laceration without foreign body of right wrist, initial encounter: Secondary | ICD-10-CM

## 2016-01-15 DIAGNOSIS — Y929 Unspecified place or not applicable: Secondary | ICD-10-CM | POA: Diagnosis not present

## 2016-01-15 DIAGNOSIS — E119 Type 2 diabetes mellitus without complications: Secondary | ICD-10-CM | POA: Diagnosis not present

## 2016-01-15 DIAGNOSIS — Z8673 Personal history of transient ischemic attack (TIA), and cerebral infarction without residual deficits: Secondary | ICD-10-CM | POA: Diagnosis not present

## 2016-01-15 DIAGNOSIS — Z794 Long term (current) use of insulin: Secondary | ICD-10-CM | POA: Diagnosis not present

## 2016-01-15 DIAGNOSIS — Z91040 Latex allergy status: Secondary | ICD-10-CM | POA: Insufficient documentation

## 2016-01-15 DIAGNOSIS — Y93G1 Activity, food preparation and clean up: Secondary | ICD-10-CM | POA: Insufficient documentation

## 2016-01-15 DIAGNOSIS — I10 Essential (primary) hypertension: Secondary | ICD-10-CM | POA: Insufficient documentation

## 2016-01-15 DIAGNOSIS — W269XXA Contact with unspecified sharp object(s), initial encounter: Secondary | ICD-10-CM | POA: Insufficient documentation

## 2016-01-15 MED ORDER — CEPHALEXIN 250 MG PO CAPS
500.0000 mg | ORAL_CAPSULE | Freq: Once | ORAL | Status: AC
  Administered 2016-01-15: 500 mg via ORAL
  Filled 2016-01-15: qty 2

## 2016-01-15 MED ORDER — TETANUS-DIPHTH-ACELL PERTUSSIS 5-2.5-18.5 LF-MCG/0.5 IM SUSP
0.5000 mL | Freq: Once | INTRAMUSCULAR | Status: AC
  Administered 2016-01-15: 0.5 mL via INTRAMUSCULAR
  Filled 2016-01-15: qty 0.5

## 2016-01-15 MED ORDER — LIDOCAINE-EPINEPHRINE-TETRACAINE (LET) SOLUTION
3.0000 mL | Freq: Once | NASAL | Status: AC
  Administered 2016-01-15: 3 mL via TOPICAL
  Filled 2016-01-15: qty 3

## 2016-01-15 MED ORDER — LIDOCAINE-EPINEPHRINE 1 %-1:100000 IJ SOLN
20.0000 mL | Freq: Once | INTRAMUSCULAR | Status: AC
  Administered 2016-01-15: 20 mL
  Filled 2016-01-15: qty 1

## 2016-01-15 MED ORDER — CEPHALEXIN 500 MG PO CAPS: 500.0000 mg | ORAL_CAPSULE | Freq: Four times a day (QID) | ORAL | Status: DC

## 2016-01-15 NOTE — ED Notes (Signed)
Pt was cleaning a vase and it cut her right wrist. Bleeding controlled.

## 2016-01-15 NOTE — Discharge Instructions (Signed)
Keep wound dry and do not remove dressing for 24 hours if possible. After that, wash gently morning and night (every 12 hours) with soap and water. Use a topical antibiotic ointment and cover with a bandaid or gauze.    Do NOT use rubbing alcohol or hydrogen peroxide, do not soak the area   Present to your primary care doctor or the urgent care of your choice, or the ED for suture removal in 7-8 days.   Every attempt was made to remove foreign body (contaminants) from the wound.  However, there is always a chance that some may remain in the wound. This can  increase your risk of infection.   If you see signs of infection (warmth, redness, tenderness, pus, sharp increase in pain, fever, red streaking in the skin) immediately return to the emergency department.   After the wound heals fully, apply sunscreen for 6-12 months to minimize scarring.    Laceration Care, Adult A laceration is a cut that goes through all of the layers of the skin and into the tissue that is right under the skin. Some lacerations heal on their own. Others need to be closed with stitches (sutures), staples, skin adhesive strips, or skin glue. Proper laceration care minimizes the risk of infection and helps the laceration to heal better. HOW TO CARE FOR YOUR LACERATION If sutures or staples were used:  Keep the wound clean and dry.  If you were given a bandage (dressing), you should change it at least one time per day or as told by your health care provider. You should also change it if it becomes wet or dirty.  Keep the wound completely dry for the first 24 hours or as told by your health care provider. After that time, you may shower or bathe. However, make sure that the wound is not soaked in water until after the sutures or staples have been removed.  Clean the wound one time each day or as told by your health care provider:  Wash the wound with soap and water.  Rinse the wound with water to remove all  soap.  Pat the wound dry with a clean towel. Do not rub the wound.  After cleaning the wound, apply a thin layer of antibiotic ointmentas told by your health care provider. This will help to prevent infection and keep the dressing from sticking to the wound.  Have the sutures or staples removed as told by your health care provider. If skin adhesive strips were used:  Keep the wound clean and dry.  If you were given a bandage (dressing), you should change it at least one time per day or as told by your health care provider. You should also change it if it becomes dirty or wet.  Do not get the skin adhesive strips wet. You may shower or bathe, but be careful to keep the wound dry.  If the wound gets wet, pat it dry with a clean towel. Do not rub the wound.  Skin adhesive strips fall off on their own. You may trim the strips as the wound heals. Do not remove skin adhesive strips that are still stuck to the wound. They will fall off in time. If skin glue was used:  Try to keep the wound dry, but you may briefly wet it in the shower or bath. Do not soak the wound in water, such as by swimming.  After you have showered or bathed, gently pat the wound dry with a clean  towel. Do not rub the wound.  Do not do any activities that will make you sweat heavily until the skin glue has fallen off on its own.  Do not apply liquid, cream, or ointment medicine to the wound while the skin glue is in place. Using those may loosen the film before the wound has healed.  If you were given a bandage (dressing), you should change it at least one time per day or as told by your health care provider. You should also change it if it becomes dirty or wet.  If a dressing is placed over the wound, be careful not to apply tape directly over the skin glue. Doing that may cause the glue to be pulled off before the wound has healed.  Do not pick at the glue. The skin glue usually remains in place for 5-10 days, then  it falls off of the skin. General Instructions  Take over-the-counter and prescription medicines only as told by your health care provider.  If you were prescribed an antibiotic medicine or ointment, take or apply it as told by your doctor. Do not stop using it even if your condition improves.  To help prevent scarring, make sure to cover your wound with sunscreen whenever you are outside after stitches are removed, after adhesive strips are removed, or when glue remains in place and the wound is healed. Make sure to wear a sunscreen of at least 30 SPF.  Do not scratch or pick at the wound.  Keep all follow-up visits as told by your health care provider. This is important.  Check your wound every day for signs of infection. Watch for:  Redness, swelling, or pain.  Fluid, blood, or pus.  Raise (elevate) the injured area above the level of your heart while you are sitting or lying down, if possible. SEEK MEDICAL CARE IF:  You received a tetanus shot and you have swelling, severe pain, redness, or bleeding at the injection site.  You have a fever.  A wound that was closed breaks open.  You notice a bad smell coming from your wound or your dressing.  You notice something coming out of the wound, such as wood or glass.  Your pain is not controlled with medicine.  You have increased redness, swelling, or pain at the site of your wound.  You have fluid, blood, or pus coming from your wound.  You notice a change in the color of your skin near your wound.  You need to change the dressing frequently due to fluid, blood, or pus draining from the wound.  You develop a new rash.  You develop numbness around the wound. SEEK IMMEDIATE MEDICAL CARE IF:  You develop severe swelling around the wound.  Your pain suddenly increases and is severe.  You develop painful lumps near the wound or on skin that is anywhere on your body.  You have a red streak going away from your  wound.  The wound is on your hand or foot and you cannot properly move a finger or toe.  The wound is on your hand or foot and you notice that your fingers or toes look pale or bluish.   This information is not intended to replace advice given to you by your health care provider. Make sure you discuss any questions you have with your health care provider.   Document Released: 06/21/2005 Document Revised: 11/05/2014 Document Reviewed: 06/17/2014 Elsevier Interactive Patient Education [employer].

## 2016-01-15 NOTE — ED Provider Notes (Signed)
CSN: [id number]     Arrival date & time 01/15/16  1841 History  By signing my name below, I, Emmanuella Mensah, attest that this documentation has been prepared under the direction and in the presence of [employer], PA-C. Electronically Signed: Judithann Mendez, ED Scribe. 01/15/2016. 8:15 PM.    Chief Complaint  Patient presents with  . Extremity Laceration   The history is provided by the patient. No language interpreter was used.   HPI Comments: Mallory Mendez is a 60 y.o. female with a hx of DM (insulin dependent), HLD, and hypertension who presents to the Emergency Department complaining of a moderate laceration to the medial aspect of her right wrist that occurred PTA. Pt is unable to fully extending her little finger on her right hand which she states is not normal. She explains that she was cleaning a vase that was broken and she lacerated her wrist on the side of the vase as she put her hand through it. No alleviating factors noted. Pt has not tried any medications PTA. She is unsure of last tetanus vaccine. No fever, chills, rash, or n/v.    Past Medical History  Diagnosis Date  . Diabetes mellitus   . Hyperlipidemia   . Blood transfusion   . Arthritis   . History of back surgery     multiple  . Hypertension   . Hernia     incisional  . Wears glasses   . MRSA (methicillin resistant Staphylococcus aureus)   . Full dentures   . Chronic constipation   . Umbilical hernia   . Headache(784.0)   . Dizziness   . Concussion     HAS HAD DIZZINESS/ MEMORY PROBLEMS/ SLOW SPEACH /BALANCE PROBLEMS SINCE CONCUSSION 1 1/2 YR AGO PR GOES TO THERAPY  . Fibromyalgia   . Scar   . GERD (gastroesophageal reflux disease)   . Chronic stomach ulcer   . Nocturia   . Sleep difficulties   . Depression   . Anxiety   . Brain injury ([place])     1 1/2 YR AGO  . Concussion   . Diabetes mellitus   . Paroxysmal vertigo   . Stroke [facility])     crica 2002   Past Surgical History  Procedure  Laterality Date  . Cholecystectomy  1984  . Spinal fusion  2006  . Eye surgery  2010    bilateral  . Abdominal hysterectomy    . Cerebral aneurysm repair    . Back surgery      SPINAL FUSION X6   . Incisional hernia repair  05/19/2011    Procedure: HERNIA REPAIR INCISIONAL;  Surgeon: Willey Blade, MD;  Location: [facility] ORS;  Service: General;  Laterality: N/A;  repair incisional hernia with mesh  . Hernia repair      09/21/2010  . Hernia repair  05/19/11    incisional hernia repair   . Appendectomy    . Tubal ligation    . Left heart catheterization with coronary angiogram N/A 11/05/2013    Procedure: LEFT HEART CATHETERIZATION WITH CORONARY ANGIOGRAM;  Surgeon: Laverda Page, MD;  Location: [facility] CATH LAB;  Service: Cardiovascular;  Laterality: N/A;  . Cardiac catheterization N/A 12/23/2015    Procedure: Left Heart Cath and Coronary Angiography;  Surgeon: Adrian Prows, MD;  Location: [facility] CV LAB;  Service: Cardiovascular;  Laterality: N/A;   Family History  Problem Relation Age of Onset  . Breast cancer Mother   . Allergies Daughter   .  Heart disease Father    Social History  Substance Use Topics  . Smoking status: Current Every Day Smoker -- 0.50 packs/day for 38 years    Types: Cigarettes  . Smokeless tobacco: Never Used  . Alcohol Use: No   OB History    No data available     Review of Systems  A complete 10 system review of systems was obtained and all systems are negative except as noted in the HPI and PMH.    Allergies  Cymbalta and Latex  Home Medications   Prior to Admission medications   Medication Sig Start Date End Date Taking? Authorizing Provider  ALPRAZolam Duanne Moron) 1 MG tablet Take 1 mg by mouth 3 (three) times daily as needed for anxiety.  10/01/13   Historical Provider, MD  Ascorbic Acid (VITAMIN C) 1000 MG tablet Take 1,000 mg by mouth daily.    Historical Provider, MD  atorvastatin (LIPITOR) 40 MG tablet Take 40 mg by mouth daily at 6 PM.      Historical Provider, MD  baclofen (LIORESAL) 10 MG tablet Take 10 mg by mouth 2 (two) times daily as needed for muscle spasms.  06/18/15   Historical Provider, MD  Biotin (BIOTIN MAXIMUM STRENGTH) 10 MG TABS Take 10 mg by mouth daily.     Historical Provider, MD  chlorthalidone (HYGROTON) 25 MG tablet Take 25 mg by mouth daily. 11/20/15   Historical Provider, MD  desvenlafaxine (PRISTIQ) 100 MG 24 hr tablet Take 100 mg by mouth daily.     Historical Provider, MD  esomeprazole (NEXIUM) 40 MG capsule Take 40 mg by mouth every evening.     Historical Provider, MD  estradiol (ESTRACE) 1 MG tablet Take 1 mg by mouth daily.    Historical Provider, MD  gabapentin (NEURONTIN) 600 MG tablet Take 600 mg by mouth 5 (five) times daily.     Historical Provider, MD  ibuprofen (ADVIL,MOTRIN) 800 MG tablet Take 800 mg by mouth daily as needed for mild pain.     Historical Provider, MD  insulin glargine (LANTUS) 100 UNIT/ML injection Inject 28 Units into the skin at bedtime.     Historical Provider, MD  insulin lispro (HUMALOG) 100 UNIT/ML injection Inject 12 Units into the skin 3 (three) times daily before meals. Sliding scale    Historical Provider, MD  isosorbide mononitrate (ISMO,MONOKET) 10 MG tablet Take 1 tablet (10 mg total) by mouth 3 (three) times daily. 10/31/13   Nita Sells, MD  lubiprostone (AMITIZA) 24 MCG capsule Take 48 mcg by mouth at bedtime.     Historical Provider, MD  meclizine (ANTIVERT) 12.5 MG tablet Take 1 tablet (12.5 mg total) by mouth 3 (three) times daily as needed for dizziness. 06/23/15   Lajean Saver, MD  methocarbamol (ROBAXIN) 500 MG tablet Take 500 mg by mouth 2 (two) times daily as needed for muscle spasms.     Historical Provider, MD  metoprolol succinate (TOPROL-XL) 50 MG 24 hr tablet Take 50 mg by mouth daily. 11/20/15   Historical Provider, MD  minocycline (DYNACIN) 100 MG tablet Take 100 mg by mouth 2 (two) times daily.    Historical Provider, MD  MYRBETRIQ 25 MG TB24  tablet Take 25 mg by mouth every evening. 06/18/15   Historical Provider, MD  nitroGLYCERIN (NITROLINGUAL) 0.4 MG/SPRAY spray Place 1 spray under the tongue every 5 (five) minutes x 3 doses as needed for chest pain. 10/31/13   Nita Sells, MD  olmesartan (BENICAR) 40 MG tablet Take 40 mg  by mouth every evening. 06/18/15   Historical Provider, MD  oxyCODONE-acetaminophen (PERCOCET) 10-325 MG per tablet Take 1 tablet by mouth 4 (four) times daily - after meals and at bedtime.     Historical Provider, MD  Oxymorphone HCl, Crush Resist, (OPANA ER, CRUSH RESISTANT,) 20 MG T12A Take 1 tablet by mouth 2 (two) times daily.    Historical Provider, MD  pantoprazole (PROTONIX) 40 MG tablet Take 1 tablet (40 mg total) by mouth daily. Patient taking differently: Take 40 mg by mouth every evening.  10/31/13   Nita Sells, MD  promethazine (PHENERGAN) 25 MG tablet Take 25 mg by mouth every 6 (six) hours as needed for nausea.    Historical Provider, MD  RESTASIS 0.05 % ophthalmic emulsion Place 2 drops into both eyes 2 (two) times daily.  10/08/13   Historical Provider, MD  torsemide (DEMADEX) 20 MG tablet Take 20 mg by mouth every morning.     Historical Provider, MD  zolpidem (AMBIEN CR) 12.5 MG CR tablet Take 12.5 mg by mouth at bedtime.     Historical Provider, MD   BP 148/81 mmHg  Pulse 79  Temp(Src) 98.4 F (36.9 C) (Oral)  Resp 18  SpO2 97% Physical Exam  Constitutional: She is oriented to person, place, and time. She appears well-developed and well-nourished. No distress.  HENT:  Head: Normocephalic and atraumatic.  Mouth/Throat: Oropharynx is clear and moist.  Eyes: Conjunctivae and EOM are normal. Pupils are equal, round, and reactive to light.  Neck: Normal range of motion.  Cardiovascular: Normal rate, regular rhythm and intact distal pulses.   Pulmonary/Chest: Effort normal and breath sounds normal.  Abdominal: Soft. There is no tenderness.  Musculoskeletal: Normal range of  motion.       Hands: 3 cm partial to full-thickness laceration as diagrammed, bleeding is controlled, distally neurovascular intact with good cap refill and sensation intact, patient cannot fully extend the fifth digit DIP  Neurological: She is alert and oriented to person, place, and time.  Skin: Skin is warm and dry. She is not diaphoretic.  Psychiatric: She has a normal mood and affect.  Nursing note and vitals reviewed.   ED Course  .Marland KitchenLaceration Repair Date/Time: 01/16/2016 2:45 AM Performed by: Monico Blitz Authorized by: Monico Blitz Consent: Verbal consent obtained. Consent given by: patient Required items: required blood products, implants, devices, and special equipment available Body area: upper extremity Location details: right wrist Laceration length: 3 cm Foreign bodies: no foreign bodies Tendon involvement: none Nerve involvement: none Vascular damage: no Anesthesia: local infiltration Preparation: Patient was prepped and draped in the usual sterile fashion. Irrigation solution: saline Irrigation method: syringe Amount of cleaning: extensive Debridement: none Degree of undermining: none Skin closure: Ethilon (5-0) Number of sutures: 5 Technique: running and simple Approximation: close Approximation difficulty: simple Patient tolerance: Patient tolerated the procedure well with no immediate complications Comments: After patient has been anesthetize the wound is explored to depth in good light on a bloodless field and no tendon can be visualized.   (including critical care time) DIAGNOSTIC STUDIES: Oxygen Saturation is 97% on RA, normal by my interpretation.    COORDINATION OF CARE:%. 8:10 PM- Pt advised of plan for treatment and pt agrees. Pt will receive right wrist x-ray for further evaluation of possible foreign body. She will receive Tdap and laceration repair with LET and Xylocaine+epi 1  Imaging Review No results found.   Monico Blitz,  PA-C has personally reviewed and evaluated these images as part of her  medical decision-making.    MDM   Final diagnoses:  None    Filed Vitals:   01/15/16 1900 01/15/16 2205  BP: 148/81 158/78  Pulse: 79 62  Temp: 98.4 F (36.9 C) 98.4 F (36.9 C)  TempSrc: Oral Oral  Resp: 18 16  SpO2: 97% 100%    Medications  Tdap (BOOSTRIX) injection 0.5 mL (0.5 mLs Intramuscular Given 01/15/16 2014)  lidocaine-EPINEPHrine-tetracaine (LET) solution (3 mLs Topical Given 01/15/16 2014)  lidocaine-EPINEPHrine (XYLOCAINE W/EPI) 1 %-1:100000 (with pres) injection 20 mL (20 mLs Other Given 01/15/16 2024)  cephALEXin (KEFLEX) capsule 500 mg (500 mg Oral Given 01/15/16 2206)    Mallory Mendez is 60 y.o. female presenting with Wound to ulnar aspect of right wrist. Neurovascularly intact but patient cannot fully extend the right fifth digit DIP. This wound is not very deep, explored to depth and there is no tendon involved. Discussed with attending who recommends closure of the wound. Wound is cleaned and closed and tetanus is updated, patient given wound care and return precautions.  Discussed case with attending physician who agrees with care plan and disposition.   Evaluation does not show pathology that would require ongoing emergent intervention or inpatient treatment. Pt is hemodynamically stable and mentating appropriately. Discussed findings and plan with patient/guardian, who agrees with care plan. All questions answered. Return precautions discussed and outpatient follow up given.   Discharge Medication List as of 01/15/2016  9:58 PM    START taking these medications   Details  cephALEXin (KEFLEX) 500 MG capsule Take 1 capsule (500 mg total) by mouth 4 (four) times daily., Starting 01/15/2016, Until Discontinued, Print        I personally performed the services described in this documentation, which was scribed in my presence. The recorded information has been reviewed and is  accurate.   Elmyra Ricks Donato Studley, PA-C 01/16/16 [id number]  Veryl Speak, MD 01/16/16 1051

## 2016-01-15 NOTE — ED Notes (Signed)
Patient Alert and oriented X4. Stable and ambulatory. Patient verbalized understanding of the discharge instructions.  Patient belongings were taken by the patient.  

## 2016-01-28 DIAGNOSIS — M47817 Spondylosis without myelopathy or radiculopathy, lumbosacral region: Secondary | ICD-10-CM | POA: Diagnosis not present

## 2016-02-05 DIAGNOSIS — Z79891 Long term (current) use of opiate analgesic: Secondary | ICD-10-CM | POA: Diagnosis not present

## 2016-02-05 DIAGNOSIS — G894 Chronic pain syndrome: Secondary | ICD-10-CM | POA: Diagnosis not present

## 2016-02-05 DIAGNOSIS — M4696 Unspecified inflammatory spondylopathy, lumbar region: Secondary | ICD-10-CM | POA: Diagnosis not present

## 2016-02-05 DIAGNOSIS — Z79899 Other long term (current) drug therapy: Secondary | ICD-10-CM | POA: Diagnosis not present

## 2016-02-05 DIAGNOSIS — M47817 Spondylosis without myelopathy or radiculopathy, lumbosacral region: Secondary | ICD-10-CM | POA: Diagnosis not present

## 2016-02-05 DIAGNOSIS — M5137 Other intervertebral disc degeneration, lumbosacral region: Secondary | ICD-10-CM | POA: Diagnosis not present

## 2016-02-24 DIAGNOSIS — F17209 Nicotine dependence, unspecified, with unspecified nicotine-induced disorders: Secondary | ICD-10-CM | POA: Diagnosis not present

## 2016-02-24 DIAGNOSIS — I209 Angina pectoris, unspecified: Secondary | ICD-10-CM | POA: Diagnosis not present

## 2016-02-24 DIAGNOSIS — I10 Essential (primary) hypertension: Secondary | ICD-10-CM | POA: Diagnosis not present

## 2016-02-24 DIAGNOSIS — I2510 Atherosclerotic heart disease of native coronary artery without angina pectoris: Secondary | ICD-10-CM | POA: Diagnosis not present

## 2016-03-04 DIAGNOSIS — M4696 Unspecified inflammatory spondylopathy, lumbar region: Secondary | ICD-10-CM | POA: Diagnosis not present

## 2016-03-04 DIAGNOSIS — Z79899 Other long term (current) drug therapy: Secondary | ICD-10-CM | POA: Diagnosis not present

## 2016-03-04 DIAGNOSIS — M5137 Other intervertebral disc degeneration, lumbosacral region: Secondary | ICD-10-CM | POA: Diagnosis not present

## 2016-03-04 DIAGNOSIS — M47817 Spondylosis without myelopathy or radiculopathy, lumbosacral region: Secondary | ICD-10-CM | POA: Diagnosis not present

## 2016-03-04 DIAGNOSIS — Z79891 Long term (current) use of opiate analgesic: Secondary | ICD-10-CM | POA: Diagnosis not present

## 2016-03-04 DIAGNOSIS — G894 Chronic pain syndrome: Secondary | ICD-10-CM | POA: Diagnosis not present

## 2016-03-17 DIAGNOSIS — R609 Edema, unspecified: Secondary | ICD-10-CM | POA: Diagnosis not present

## 2016-03-17 DIAGNOSIS — M545 Low back pain: Secondary | ICD-10-CM | POA: Diagnosis not present

## 2016-03-17 DIAGNOSIS — L719 Rosacea, unspecified: Secondary | ICD-10-CM | POA: Diagnosis not present

## 2016-03-17 DIAGNOSIS — E7800 Pure hypercholesterolemia, unspecified: Secondary | ICD-10-CM | POA: Diagnosis not present

## 2016-03-17 DIAGNOSIS — F419 Anxiety disorder, unspecified: Secondary | ICD-10-CM | POA: Diagnosis not present

## 2016-03-17 DIAGNOSIS — N3281 Overactive bladder: Secondary | ICD-10-CM | POA: Diagnosis not present

## 2016-03-17 DIAGNOSIS — I2510 Atherosclerotic heart disease of native coronary artery without angina pectoris: Secondary | ICD-10-CM | POA: Diagnosis not present

## 2016-03-17 DIAGNOSIS — I10 Essential (primary) hypertension: Secondary | ICD-10-CM | POA: Diagnosis not present

## 2016-03-17 DIAGNOSIS — F0781 Postconcussional syndrome: Secondary | ICD-10-CM | POA: Diagnosis not present

## 2016-03-17 DIAGNOSIS — E1165 Type 2 diabetes mellitus with hyperglycemia: Secondary | ICD-10-CM | POA: Diagnosis not present

## 2016-03-17 DIAGNOSIS — Z23 Encounter for immunization: Secondary | ICD-10-CM | POA: Diagnosis not present

## 2016-03-17 DIAGNOSIS — F17200 Nicotine dependence, unspecified, uncomplicated: Secondary | ICD-10-CM | POA: Diagnosis not present

## 2016-04-01 DIAGNOSIS — M4696 Unspecified inflammatory spondylopathy, lumbar region: Secondary | ICD-10-CM | POA: Diagnosis not present

## 2016-04-01 DIAGNOSIS — Z79899 Other long term (current) drug therapy: Secondary | ICD-10-CM | POA: Diagnosis not present

## 2016-04-01 DIAGNOSIS — M1288 Other specific arthropathies, not elsewhere classified, other specified site: Secondary | ICD-10-CM | POA: Diagnosis not present

## 2016-04-01 DIAGNOSIS — M5137 Other intervertebral disc degeneration, lumbosacral region: Secondary | ICD-10-CM | POA: Diagnosis not present

## 2016-04-01 DIAGNOSIS — G894 Chronic pain syndrome: Secondary | ICD-10-CM | POA: Diagnosis not present

## 2016-04-01 DIAGNOSIS — Z79891 Long term (current) use of opiate analgesic: Secondary | ICD-10-CM | POA: Diagnosis not present

## 2016-04-21 DIAGNOSIS — N3942 Incontinence without sensory awareness: Secondary | ICD-10-CM | POA: Diagnosis not present

## 2016-04-21 DIAGNOSIS — N8111 Cystocele, midline: Secondary | ICD-10-CM | POA: Diagnosis not present

## 2016-04-21 DIAGNOSIS — N393 Stress incontinence (female) (male): Secondary | ICD-10-CM | POA: Diagnosis not present

## 2016-04-21 DIAGNOSIS — R350 Frequency of micturition: Secondary | ICD-10-CM | POA: Diagnosis not present

## 2016-04-21 DIAGNOSIS — R351 Nocturia: Secondary | ICD-10-CM | POA: Diagnosis not present

## 2016-04-28 ENCOUNTER — Other Ambulatory Visit (INDEPENDENT_AMBULATORY_CARE_PROVIDER_SITE_OTHER): Payer: Self-pay | Admitting: Specialist

## 2016-04-28 NOTE — Telephone Encounter (Signed)
Request for refill of neurontin 800 mg po 3-4 times a day approved #[id number]. jen

## 2016-04-28 NOTE — Telephone Encounter (Signed)
Ok to refill 

## 2016-04-29 DIAGNOSIS — Z79891 Long term (current) use of opiate analgesic: Secondary | ICD-10-CM | POA: Diagnosis not present

## 2016-04-29 DIAGNOSIS — G894 Chronic pain syndrome: Secondary | ICD-10-CM | POA: Diagnosis not present

## 2016-04-29 DIAGNOSIS — M5137 Other intervertebral disc degeneration, lumbosacral region: Secondary | ICD-10-CM | POA: Diagnosis not present

## 2016-04-29 DIAGNOSIS — Z79899 Other long term (current) drug therapy: Secondary | ICD-10-CM | POA: Diagnosis not present

## 2016-04-29 DIAGNOSIS — M47817 Spondylosis without myelopathy or radiculopathy, lumbosacral region: Secondary | ICD-10-CM | POA: Diagnosis not present

## 2016-04-29 DIAGNOSIS — M4696 Unspecified inflammatory spondylopathy, lumbar region: Secondary | ICD-10-CM | POA: Diagnosis not present

## 2016-05-11 DIAGNOSIS — N8111 Cystocele, midline: Secondary | ICD-10-CM | POA: Diagnosis not present

## 2016-05-11 DIAGNOSIS — N3944 Nocturnal enuresis: Secondary | ICD-10-CM | POA: Diagnosis not present

## 2016-05-11 DIAGNOSIS — N390 Urinary tract infection, site not specified: Secondary | ICD-10-CM | POA: Diagnosis not present

## 2016-05-11 DIAGNOSIS — R350 Frequency of micturition: Secondary | ICD-10-CM | POA: Diagnosis not present

## 2016-05-20 DIAGNOSIS — E1140 Type 2 diabetes mellitus with diabetic neuropathy, unspecified: Secondary | ICD-10-CM | POA: Diagnosis not present

## 2016-05-20 DIAGNOSIS — E1065 Type 1 diabetes mellitus with hyperglycemia: Secondary | ICD-10-CM | POA: Diagnosis not present

## 2016-05-20 DIAGNOSIS — I10 Essential (primary) hypertension: Secondary | ICD-10-CM | POA: Diagnosis not present

## 2016-05-20 DIAGNOSIS — E7800 Pure hypercholesterolemia, unspecified: Secondary | ICD-10-CM | POA: Diagnosis not present

## 2016-05-31 DIAGNOSIS — M545 Low back pain: Secondary | ICD-10-CM | POA: Diagnosis not present

## 2016-05-31 DIAGNOSIS — G894 Chronic pain syndrome: Secondary | ICD-10-CM | POA: Diagnosis not present

## 2016-05-31 DIAGNOSIS — Z79891 Long term (current) use of opiate analgesic: Secondary | ICD-10-CM | POA: Diagnosis not present

## 2016-05-31 DIAGNOSIS — M47816 Spondylosis without myelopathy or radiculopathy, lumbar region: Secondary | ICD-10-CM | POA: Diagnosis not present

## 2016-05-31 DIAGNOSIS — Z79899 Other long term (current) drug therapy: Secondary | ICD-10-CM | POA: Diagnosis not present

## 2016-06-23 DIAGNOSIS — N3942 Incontinence without sensory awareness: Secondary | ICD-10-CM | POA: Diagnosis not present

## 2016-06-23 DIAGNOSIS — N393 Stress incontinence (female) (male): Secondary | ICD-10-CM | POA: Diagnosis not present

## 2016-06-30 DIAGNOSIS — Z79899 Other long term (current) drug therapy: Secondary | ICD-10-CM | POA: Diagnosis not present

## 2016-06-30 DIAGNOSIS — M47816 Spondylosis without myelopathy or radiculopathy, lumbar region: Secondary | ICD-10-CM | POA: Diagnosis not present

## 2016-06-30 DIAGNOSIS — M545 Low back pain: Secondary | ICD-10-CM | POA: Diagnosis not present

## 2016-06-30 DIAGNOSIS — Z79891 Long term (current) use of opiate analgesic: Secondary | ICD-10-CM | POA: Diagnosis not present

## 2016-06-30 DIAGNOSIS — G894 Chronic pain syndrome: Secondary | ICD-10-CM | POA: Diagnosis not present

## 2016-07-01 DIAGNOSIS — I10 Essential (primary) hypertension: Secondary | ICD-10-CM | POA: Diagnosis not present

## 2016-07-01 DIAGNOSIS — I209 Angina pectoris, unspecified: Secondary | ICD-10-CM | POA: Diagnosis not present

## 2016-07-01 DIAGNOSIS — I2510 Atherosclerotic heart disease of native coronary artery without angina pectoris: Secondary | ICD-10-CM | POA: Diagnosis not present

## 2016-07-01 DIAGNOSIS — F17209 Nicotine dependence, unspecified, with unspecified nicotine-induced disorders: Secondary | ICD-10-CM | POA: Diagnosis not present

## 2016-07-16 DIAGNOSIS — N3942 Incontinence without sensory awareness: Secondary | ICD-10-CM | POA: Diagnosis not present

## 2016-07-27 ENCOUNTER — Telehealth (HOSPITAL_COMMUNITY): Payer: Self-pay

## 2016-07-27 NOTE — Telephone Encounter (Signed)
Called to schedule 1 year f/u angio, no answer, mailbox full. AW

## 2016-07-28 DIAGNOSIS — M545 Low back pain: Secondary | ICD-10-CM | POA: Diagnosis not present

## 2016-07-28 DIAGNOSIS — G894 Chronic pain syndrome: Secondary | ICD-10-CM | POA: Diagnosis not present

## 2016-07-28 DIAGNOSIS — Z79891 Long term (current) use of opiate analgesic: Secondary | ICD-10-CM | POA: Diagnosis not present

## 2016-07-28 DIAGNOSIS — Z79899 Other long term (current) drug therapy: Secondary | ICD-10-CM | POA: Diagnosis not present

## 2016-07-28 DIAGNOSIS — M47816 Spondylosis without myelopathy or radiculopathy, lumbar region: Secondary | ICD-10-CM | POA: Diagnosis not present

## 2016-08-02 DIAGNOSIS — N3944 Nocturnal enuresis: Secondary | ICD-10-CM | POA: Diagnosis not present

## 2016-08-02 DIAGNOSIS — N393 Stress incontinence (female) (male): Secondary | ICD-10-CM | POA: Diagnosis not present

## 2016-08-25 DIAGNOSIS — Z79891 Long term (current) use of opiate analgesic: Secondary | ICD-10-CM | POA: Diagnosis not present

## 2016-08-25 DIAGNOSIS — M4696 Unspecified inflammatory spondylopathy, lumbar region: Secondary | ICD-10-CM | POA: Diagnosis not present

## 2016-08-25 DIAGNOSIS — Z79899 Other long term (current) drug therapy: Secondary | ICD-10-CM | POA: Diagnosis not present

## 2016-08-25 DIAGNOSIS — M545 Low back pain: Secondary | ICD-10-CM | POA: Diagnosis not present

## 2016-08-25 DIAGNOSIS — M47816 Spondylosis without myelopathy or radiculopathy, lumbar region: Secondary | ICD-10-CM | POA: Diagnosis not present

## 2016-08-25 DIAGNOSIS — G894 Chronic pain syndrome: Secondary | ICD-10-CM | POA: Diagnosis not present

## 2016-08-25 DIAGNOSIS — M47817 Spondylosis without myelopathy or radiculopathy, lumbosacral region: Secondary | ICD-10-CM | POA: Diagnosis not present

## 2016-08-25 DIAGNOSIS — M5137 Other intervertebral disc degeneration, lumbosacral region: Secondary | ICD-10-CM | POA: Diagnosis not present

## 2016-09-21 ENCOUNTER — Other Ambulatory Visit (HOSPITAL_COMMUNITY): Payer: Self-pay | Admitting: Interventional Radiology

## 2016-09-21 DIAGNOSIS — I729 Aneurysm of unspecified site: Secondary | ICD-10-CM

## 2016-09-22 DIAGNOSIS — M5137 Other intervertebral disc degeneration, lumbosacral region: Secondary | ICD-10-CM | POA: Diagnosis not present

## 2016-09-22 DIAGNOSIS — M47816 Spondylosis without myelopathy or radiculopathy, lumbar region: Secondary | ICD-10-CM | POA: Diagnosis not present

## 2016-09-22 DIAGNOSIS — M545 Low back pain: Secondary | ICD-10-CM | POA: Diagnosis not present

## 2016-09-22 DIAGNOSIS — G894 Chronic pain syndrome: Secondary | ICD-10-CM | POA: Diagnosis not present

## 2016-09-22 DIAGNOSIS — M47817 Spondylosis without myelopathy or radiculopathy, lumbosacral region: Secondary | ICD-10-CM | POA: Diagnosis not present

## 2016-09-22 DIAGNOSIS — M4696 Unspecified inflammatory spondylopathy, lumbar region: Secondary | ICD-10-CM | POA: Diagnosis not present

## 2016-09-22 DIAGNOSIS — Z79899 Other long term (current) drug therapy: Secondary | ICD-10-CM | POA: Diagnosis not present

## 2016-09-22 DIAGNOSIS — Z79891 Long term (current) use of opiate analgesic: Secondary | ICD-10-CM | POA: Diagnosis not present

## 2016-09-28 ENCOUNTER — Other Ambulatory Visit: Payer: Self-pay | Admitting: Radiology

## 2016-09-29 ENCOUNTER — Other Ambulatory Visit: Payer: Self-pay | Admitting: General Surgery

## 2016-09-30 ENCOUNTER — Encounter (HOSPITAL_COMMUNITY): Payer: Self-pay

## 2016-09-30 ENCOUNTER — Ambulatory Visit (HOSPITAL_COMMUNITY)
Admission: RE | Admit: 2016-09-30 | Discharge: 2016-09-30 | Disposition: A | Discharge status: Home / Self Care | Payer: Medicare Other | Source: Ambulatory Visit | Attending: Interventional Radiology | Admitting: Interventional Radiology

## 2016-09-30 ENCOUNTER — Other Ambulatory Visit (HOSPITAL_COMMUNITY): Payer: Self-pay | Admitting: Interventional Radiology

## 2016-09-30 DIAGNOSIS — F419 Anxiety disorder, unspecified: Secondary | ICD-10-CM | POA: Insufficient documentation

## 2016-09-30 DIAGNOSIS — Z8719 Personal history of other diseases of the digestive system: Secondary | ICD-10-CM | POA: Diagnosis not present

## 2016-09-30 DIAGNOSIS — R51 Headache: Secondary | ICD-10-CM | POA: Insufficient documentation

## 2016-09-30 DIAGNOSIS — Z8614 Personal history of Methicillin resistant Staphylococcus aureus infection: Secondary | ICD-10-CM | POA: Insufficient documentation

## 2016-09-30 DIAGNOSIS — R42 Dizziness and giddiness: Secondary | ICD-10-CM | POA: Diagnosis not present

## 2016-09-30 DIAGNOSIS — M1990 Unspecified osteoarthritis, unspecified site: Secondary | ICD-10-CM | POA: Insufficient documentation

## 2016-09-30 DIAGNOSIS — F329 Major depressive disorder, single episode, unspecified: Secondary | ICD-10-CM | POA: Diagnosis not present

## 2016-09-30 DIAGNOSIS — I6522 Occlusion and stenosis of left carotid artery: Secondary | ICD-10-CM | POA: Diagnosis not present

## 2016-09-30 DIAGNOSIS — I729 Aneurysm of unspecified site: Secondary | ICD-10-CM

## 2016-09-30 DIAGNOSIS — E119 Type 2 diabetes mellitus without complications: Secondary | ICD-10-CM | POA: Diagnosis not present

## 2016-09-30 DIAGNOSIS — F17210 Nicotine dependence, cigarettes, uncomplicated: Secondary | ICD-10-CM | POA: Insufficient documentation

## 2016-09-30 DIAGNOSIS — Z8673 Personal history of transient ischemic attack (TIA), and cerebral infarction without residual deficits: Secondary | ICD-10-CM | POA: Diagnosis not present

## 2016-09-30 DIAGNOSIS — K5909 Other constipation: Secondary | ICD-10-CM | POA: Insufficient documentation

## 2016-09-30 DIAGNOSIS — E785 Hyperlipidemia, unspecified: Secondary | ICD-10-CM | POA: Diagnosis not present

## 2016-09-30 DIAGNOSIS — Z91040 Latex allergy status: Secondary | ICD-10-CM | POA: Diagnosis not present

## 2016-09-30 DIAGNOSIS — M797 Fibromyalgia: Secondary | ICD-10-CM | POA: Insufficient documentation

## 2016-09-30 DIAGNOSIS — Z8774 Personal history of (corrected) congenital malformations of heart and circulatory system: Secondary | ICD-10-CM | POA: Insufficient documentation

## 2016-09-30 DIAGNOSIS — K219 Gastro-esophageal reflux disease without esophagitis: Secondary | ICD-10-CM | POA: Diagnosis not present

## 2016-09-30 DIAGNOSIS — I10 Essential (primary) hypertension: Secondary | ICD-10-CM | POA: Diagnosis not present

## 2016-09-30 HISTORY — PX: IR GENERIC HISTORICAL: IMG1180011

## 2016-09-30 LAB — CBC
HEMATOCRIT: 41.8 % (ref 36.0–46.0)
Hemoglobin: 13.9 g/dL (ref 12.0–15.0)
MCH: 28.7 pg (ref 26.0–34.0)
MCHC: 33.3 g/dL (ref 30.0–36.0)
MCV: 86.2 fL (ref 78.0–100.0)
Platelets: 189 10*3/uL (ref 150–400)
RBC: 4.85 MIL/uL (ref 3.87–5.11)
RDW: 14.4 % (ref 11.5–15.5)
WBC: 12.7 10*3/uL — ABNORMAL HIGH (ref 4.0–10.5)

## 2016-09-30 LAB — PROTIME-INR
INR: 0.95
Prothrombin Time: 12.7 seconds (ref 11.4–15.2)

## 2016-09-30 LAB — BASIC METABOLIC PANEL
Anion gap: 7 (ref 5–15)
BUN: 17 mg/dL (ref 6–20)
CALCIUM: 9.2 mg/dL (ref 8.9–10.3)
CO2: 28 mmol/L (ref 22–32)
CREATININE: 0.55 mg/dL (ref 0.44–1.00)
Chloride: 99 mmol/L — ABNORMAL LOW (ref 101–111)
GFR calc non Af Amer: >60 mL/min (ref >60)
GLUCOSE: 204 mg/dL — AB (ref 65–99)
Potassium: 3.8 mmol/L (ref 3.5–5.1)
Sodium: 134 mmol/L — ABNORMAL LOW (ref 135–145)

## 2016-09-30 LAB — APTT: aPTT: 27 seconds (ref 24–36)

## 2016-09-30 LAB — GLUCOSE, CAPILLARY: GLUCOSE-CAPILLARY: 238 mg/dL — AB (ref 65–99)

## 2016-09-30 MED ORDER — MIDAZOLAM HCL 2 MG/2ML IJ SOLN
INTRAMUSCULAR | Status: AC
  Filled 2016-09-30: qty 2

## 2016-09-30 MED ORDER — HEPARIN SODIUM (PORCINE) 1000 UNIT/ML IJ SOLN
INTRAMUSCULAR | Status: AC
  Filled 2016-09-30: qty 1

## 2016-09-30 MED ORDER — FENTANYL CITRATE (PF) 100 MCG/2ML IJ SOLN
INTRAMUSCULAR | Status: AC | PRN
  Administered 2016-09-30: 25 ug via INTRAVENOUS

## 2016-09-30 MED ORDER — FENTANYL CITRATE (PF) 100 MCG/2ML IJ SOLN
INTRAMUSCULAR | Status: AC
  Filled 2016-09-30: qty 2

## 2016-09-30 MED ORDER — IOPAMIDOL (ISOVUE-300) INJECTION 61%
INTRAVENOUS | Status: AC
  Administered 2016-09-30: 50 mL
  Filled 2016-09-30: qty 150

## 2016-09-30 MED ORDER — HEPARIN SODIUM (PORCINE) 1000 UNIT/ML IJ SOLN
1000.0000 [IU] | Freq: Once | INTRAMUSCULAR | Status: AC
  Administered 2016-09-30: 1000 [IU] via INTRAVENOUS

## 2016-09-30 MED ORDER — SODIUM CHLORIDE 0.9 % IV SOLN: INTRAVENOUS | Status: AC

## 2016-09-30 MED ORDER — IOPAMIDOL (ISOVUE-300) INJECTION 61%
INTRAVENOUS | Status: AC
  Administered 2016-09-30: 20 mL
  Filled 2016-09-30: qty 50

## 2016-09-30 MED ORDER — LIDOCAINE HCL 1 % IJ SOLN
INTRAMUSCULAR | Status: AC
  Filled 2016-09-30: qty 20

## 2016-09-30 MED ORDER — LIDOCAINE HCL (PF) 1 % IJ SOLN
INTRAMUSCULAR | Status: AC | PRN
  Administered 2016-09-30: 10 mL

## 2016-09-30 MED ORDER — MIDAZOLAM HCL 2 MG/2ML IJ SOLN
INTRAMUSCULAR | Status: AC | PRN
  Administered 2016-09-30: 1 mg via INTRAVENOUS

## 2016-09-30 MED ORDER — SODIUM CHLORIDE 0.9 % IV SOLN: Freq: Once | INTRAVENOUS | Status: DC

## 2016-09-30 NOTE — Procedures (Signed)
S/P 4 vessel cerebral arteriogram. RT CFA approach. Findings. 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 2.patent stent  across neck of treated aneurysm

## 2016-09-30 NOTE — Sedation Documentation (Signed)
Post procedure pulses continue to be 2  Bi Lat PT and DP

## 2016-09-30 NOTE — Sedation Documentation (Signed)
Patient is resting comfortably. 

## 2016-09-30 NOTE — Sedation Documentation (Signed)
Pulses 2 bilat both feet

## 2016-09-30 NOTE — Discharge Instructions (Signed)
Angiogram, Care After This sheet gives you information about how to care for yourself after your procedure. Your doctor may also give you more specific instructions. If you have problems or questions, contact your doctor. Follow these instructions at home: Insertion site care  Follow instructions from your doctor about how to take care of your long, thin tube (catheter) insertion area. Make sure you: Wash your hands with soap and water before you change your bandage (dressing). If you cannot use soap and water, use hand sanitizer. Change your bandage as told by your doctor. Leave stitches (sutures), skin glue, or skin tape (adhesive) strips in place. They may need to stay in place for 2 weeks or longer. If tape strips get loose and curl up, you may trim the loose edges. Do not remove tape strips completely unless your doctor says it is okay. Do not take baths, swim, or use a hot tub until your doctor says it is okay. You may shower 24-48 hours after the procedure or as told by your doctor. Gently wash the area with plain soap and water. Pat the area dry with a clean towel. Do not rub the area. This may cause bleeding. Do not apply powder or lotion to the area. Keep the area clean and dry. Check your insertion area every day for signs of infection. Check for: More redness, swelling, or pain. Fluid or blood. Warmth. Pus or a bad smell. Activity  Rest as told by your doctor, usually for 1-2 days. Do not lift anything that is heavier than 10 lbs. (4.5 kg) or as told by your doctor. Do not drive for 24 hours if you were given a medicine to help you relax (sedative). Do not drive or use heavy machinery while taking prescription pain medicine. General instructions  Go back to your normal activities as told by your doctor, usually in about a week. Ask your doctor what activities are safe for you. If the insertion area starts to bleed, lie flat and put pressure on the area. If the bleeding does not  stop, get help right away. This is an emergency. Drink enough fluid to keep your pee (urine) clear or pale yellow. Take over-the-counter and prescription medicines only as told by your doctor. Keep all follow-up visits as told by your doctor. This is important. Contact a doctor if: You have a fever. You have chills. You have more redness, swelling, or pain around your insertion area. You have fluid or blood coming from your insertion area. The insertion area feels warm to the touch. You have pus or a bad smell coming from your insertion area. You have more bruising around the insertion area. Blood collects in the tissue around the insertion area (hematoma) that may be painful to the touch. Get help right away if: You have a lot of pain in the insertion area. The insertion area swells very fast. The insertion area is bleeding, and the bleeding does not stop after holding steady pressure on the area. The area near or just beyond the insertion area becomes pale, cool, tingly, or numb. These symptoms may be an emergency. Do not wait to see if the symptoms will go away. Get medical help right away. Call your local emergency services (911 in the [place] Do not drive yourself to the hospital. Summary After the procedure, it is common to have bruising and tenderness at the long, thin tube insertion area. After the procedure, it is important to rest and drink plenty of fluids. Do not  take baths, swim, or use a hot tub until your doctor says it is okay to do so. You may shower 24-48 hours after the procedure or as told by your doctor. If the insertion area starts to bleed, lie flat and put pressure on the area. If the bleeding does not stop, get help right away. This is an emergency. This information is not intended to replace advice given to you by your health care provider. Make sure you discuss any questions you have with your health care provider. Document Released: 09/17/2008 Document Revised:  06/15/2016 Document Reviewed: 06/15/2016 Elsevier Interactive Patient Education  2017 [place]. Moderate Conscious Sedation, Adult, Care After These instructions provide you with information about caring for yourself after your procedure. Your health care provider may also give you more specific instructions. Your treatment has been planned according to current medical practices, but problems sometimes occur. Call your health care provider if you have any problems or questions after your procedure. What can I expect after the procedure? After your procedure, it is common:  To feel sleepy for several hours.  To feel clumsy and have poor balance for several hours.  To have poor judgment for several hours.  To vomit if you eat too soon. Follow these instructions at home: For at least 24 hours after the procedure:    Do not:  Participate in activities where you could fall or become injured.  Drive.  Use heavy machinery.  Drink alcohol.  Take sleeping pills or medicines that cause drowsiness.  Make important decisions or sign legal documents.  Take care of children on your own.  Rest. Eating and drinking   Follow the diet recommended by your health care provider.  If you vomit:  Drink water, juice, or soup when you can drink without vomiting.  Make sure you have little or no nausea before eating solid foods. General instructions   Have a responsible adult stay with you until you are awake and alert.  Take over-the-counter and prescription medicines only as told by your health care provider.  If you smoke, do not smoke without supervision.  Keep all follow-up visits as told by your health care provider. This is important. Contact a health care provider if:  You keep feeling nauseous or you keep vomiting.  You feel light-headed.  You develop a rash.  You have a fever. Get help right away if:  You have trouble breathing. This information is not intended  to replace advice given to you by your health care provider. Make sure you discuss any questions you have with your health care provider. Document Released: 04/11/2013 Document Revised: 11/24/2015 Document Reviewed: 10/11/2015 Elsevier Interactive Patient Education  2017 [employer].

## 2016-09-30 NOTE — H&P (Signed)
Chief Complaint: hx of basilar artery aneurysm  Supervising Physician: Luanne Bras  Patient Status: [facility] - Out-pt  HPI: Mallory Mendez is an 61 y.o. female who is known to Dr. Estanislado Pandy for stenting of a basilar artery aneurysm in 2012.  She comes back today for a diagnostic angiogram to continue to follow this area of treatment and to verify no new findings.  The patient still has occasional headaches, but states this is normal for her.  She does complain of some left eye vision problems, but cannot specifically express what those problems are.  She otherwise has no infectious complaints, CP, or SOB.  Past Medical History:  Past Medical History:  Diagnosis Date  . Anxiety   . Arthritis   . Blood transfusion   . Brain injury ([place])    1 1/2 YR AGO  . Chronic constipation   . Chronic stomach ulcer   . Concussion    HAS HAD DIZZINESS/ MEMORY PROBLEMS/ SLOW SPEACH /BALANCE PROBLEMS SINCE CONCUSSION 1 1/2 YR AGO PR GOES TO THERAPY  . Concussion   . Depression   . Diabetes mellitus   . Diabetes mellitus   . Dizziness   . Fibromyalgia   . Full dentures   . GERD (gastroesophageal reflux disease)   . Headache(784.0)   . Hernia    incisional  . History of back surgery    multiple  . Hyperlipidemia   . Hypertension   . MRSA (methicillin resistant Staphylococcus aureus)   . Nocturia   . Paroxysmal vertigo   . Scar   . Sleep difficulties   . Stroke ([place])    crica 2002  . Umbilical hernia   . Wears glasses     Past Surgical History:  Past Surgical History:  Procedure Laterality Date  . ABDOMINAL HYSTERECTOMY    . APPENDECTOMY    . BACK SURGERY     SPINAL FUSION X6   . CARDIAC CATHETERIZATION N/A 12/23/2015   Procedure: Left Heart Cath and Coronary Angiography;  Surgeon: Adrian Prows, MD;  Location: [place] [facility];  Service: Cardiovascular;  Laterality: N/A;  . CEREBRAL ANEURYSM REPAIR    . CHOLECYSTECTOMY  1984  . EYE SURGERY  2010   bilateral  . HERNIA  REPAIR     09/21/2010  . HERNIA REPAIR  05/19/11   incisional hernia repair   . INCISIONAL HERNIA REPAIR  05/19/2011   Procedure: HERNIA REPAIR INCISIONAL;  Surgeon: Willey Blade, MD;  Location: [facility];  Service: General;  Laterality: N/A;  repair incisional hernia with mesh  . LEFT HEART CATHETERIZATION WITH CORONARY ANGIOGRAM N/A 11/05/2013   Procedure: LEFT HEART CATHETERIZATION WITH CORONARY ANGIOGRAM;  Surgeon: Laverda Page, MD;  Location: [facility] CATH LAB;  Service: Cardiovascular;  Laterality: N/A;  . SPINAL FUSION  2006  . TUBAL LIGATION      Family History:  Family History  Problem Relation Age of Onset  . Breast cancer Mother   . Heart disease Father   . Allergies Daughter     Social History:  reports that she has been smoking Cigarettes.  She has a 19.00 pack-year smoking history. She has never used smokeless tobacco. She reports that she does not drink alcohol or use drugs.  Allergies:  Allergies  Allergen Reactions  . Cymbalta [Duloxetine Hcl] Anaphylaxis  . Latex Anaphylaxis and Hives    Medications: Medications reviewed in epic  Please HPI for pertinent positives, otherwise complete 10 system ROS negative.  Mallampati Score:  MD Evaluation Airway: WNL Heart: WNL Abdomen: WNL Chest/ Lungs: WNL ASA  Classification: 3 Mallampati/Airway Score: One  Physical Exam: BP 122/74   Pulse 73   Temp 97.9 F (36.6 C) (Oral)   Resp 16   Ht 5' (1.524 m)   Wt 148 lb (67.1 kg)   SpO2 100%   BMI 28.90 kg/m  Body mass index is 28.9 kg/m. General: pleasant, WD, WN white female who is laying in bed in NAD HEENT: head is normocephalic, atraumatic.  Sclera are noninjected.  PERRL.  Ears and nose without any masses or lesions.  Mouth is pink and moist Heart: regular, rate, and rhythm.  Normal s1,s2. No obvious murmurs, gallops, or rubs noted.  Palpable radial and pedal pulses bilaterally Lungs: CTAB, no wheezes, rhonchi, or rales noted.  Respiratory effort  nonlabored Abd: soft, NT, ND, +BS, no masses, hernias, or organomegaly Psych: A&Ox3 with an appropriate affect.   Labs: Results for orders placed or performed during the hospital encounter of 09/30/16 (from the past 48 hour(s))  APTT     Status: None   Collection Time: 09/30/16  7:05 AM  Result Value Ref Range   aPTT 27 24 - 36 seconds  Basic metabolic panel     Status: Abnormal   Collection Time: 09/30/16  7:05 AM  Result Value Ref Range   Sodium 134 (L) 135 - 145 mmol/L   Potassium 3.8 3.5 - 5.1 mmol/L   Chloride 99 (L) 101 - 111 mmol/L   CO2 28 22 - 32 mmol/L   Glucose, Bld 204 (H) 65 - 99 mg/dL   BUN 17 6 - 20 mg/dL   Creatinine, Ser 0.55 0.44 - 1.00 mg/dL   Calcium 9.2 8.9 - 10.3 mg/dL   GFR calc non Af Amer >60 >60 mL/min   GFR calc Af Amer >60 >60 mL/min    Comment: (NOTE) The eGFR has been calculated using the CKD EPI equation. This calculation has not been validated in all clinical situations. eGFR's persistently <60 mL/min signify possible Chronic Kidney Disease.    Anion gap 7 5 - 15  CBC     Status: Abnormal   Collection Time: 09/30/16  7:05 AM  Result Value Ref Range   WBC 12.7 (H) 4.0 - 10.5 K/uL   RBC 4.85 3.87 - 5.11 MIL/uL   Hemoglobin 13.9 12.0 - 15.0 g/dL   HCT 41.8 36.0 - 46.0 %   MCV 86.2 78.0 - 100.0 fL   MCH 28.7 26.0 - 34.0 pg   MCHC 33.3 30.0 - 36.0 g/dL   RDW 14.4 11.5 - 15.5 %   Platelets 189 150 - 400 K/uL  Protime-INR     Status: None   Collection Time: 09/30/16  7:05 AM  Result Value Ref Range   Prothrombin Time 12.7 11.4 - 15.2 seconds   INR 0.95     Imaging: No results found.  Assessment/Plan 1. History of basilar artery aneurysm, s/p stenting in 2012 We will proceed today with a diagnostic cerebral angiogram to assess this area of previous intervention. Her labs and vitals have been reviewed.  Her WBC is slightly elevated, but she has no infectious complaints such as fevers, cough, dysuria, etc.  Given this procedure is just  diagnostic and no intervention planned, Dr. Estanislado Pandy is ok to proceed with a WBC of 12. Risks and Benefits discussed with the patient including, but not limited to bleeding, infection, vascular injury or contrast induced renal failure. All of the patient's questions were  answered, patient is agreeable to proceed. Consent signed and in chart.   Thank you for this interesting consult.  I greatly enjoyed meeting DANELIA SNODGRASS and look forward to participating in their care.  A copy of this report was sent to the requesting provider on this date.  Electronically Signed: Henreitta Cea 09/30/2016, 8:36 AM   I spent a total of  30 Minutes   in face to face in clinical consultation, greater than 50% of which was counseling/coordinating care for hx oa basilar artery aneurysm

## 2016-10-01 ENCOUNTER — Encounter (HOSPITAL_COMMUNITY): Payer: Self-pay | Admitting: Interventional Radiology

## 2016-10-12 DIAGNOSIS — Z79899 Other long term (current) drug therapy: Secondary | ICD-10-CM | POA: Diagnosis not present

## 2016-10-12 DIAGNOSIS — F4542 Pain disorder with related psychological factors: Secondary | ICD-10-CM | POA: Diagnosis not present

## 2016-10-12 DIAGNOSIS — G609 Hereditary and idiopathic neuropathy, unspecified: Secondary | ICD-10-CM | POA: Diagnosis not present

## 2016-10-12 DIAGNOSIS — Z79891 Long term (current) use of opiate analgesic: Secondary | ICD-10-CM | POA: Diagnosis not present

## 2016-10-12 DIAGNOSIS — M792 Neuralgia and neuritis, unspecified: Secondary | ICD-10-CM | POA: Diagnosis not present

## 2016-10-12 DIAGNOSIS — G894 Chronic pain syndrome: Secondary | ICD-10-CM | POA: Diagnosis not present

## 2016-10-13 DIAGNOSIS — N3281 Overactive bladder: Secondary | ICD-10-CM | POA: Diagnosis not present

## 2016-10-13 DIAGNOSIS — I10 Essential (primary) hypertension: Secondary | ICD-10-CM | POA: Diagnosis not present

## 2016-10-13 DIAGNOSIS — Z794 Long term (current) use of insulin: Secondary | ICD-10-CM | POA: Diagnosis not present

## 2016-10-13 DIAGNOSIS — G609 Hereditary and idiopathic neuropathy, unspecified: Secondary | ICD-10-CM | POA: Diagnosis not present

## 2016-10-13 DIAGNOSIS — E7800 Pure hypercholesterolemia, unspecified: Secondary | ICD-10-CM | POA: Diagnosis not present

## 2016-10-13 DIAGNOSIS — M797 Fibromyalgia: Secondary | ICD-10-CM | POA: Diagnosis not present

## 2016-10-13 DIAGNOSIS — F419 Anxiety disorder, unspecified: Secondary | ICD-10-CM | POA: Diagnosis not present

## 2016-10-13 DIAGNOSIS — E1042 Type 1 diabetes mellitus with diabetic polyneuropathy: Secondary | ICD-10-CM | POA: Diagnosis not present

## 2016-10-13 DIAGNOSIS — F17200 Nicotine dependence, unspecified, uncomplicated: Secondary | ICD-10-CM | POA: Diagnosis not present

## 2016-10-13 DIAGNOSIS — K219 Gastro-esophageal reflux disease without esophagitis: Secondary | ICD-10-CM | POA: Diagnosis not present

## 2016-10-13 DIAGNOSIS — E1065 Type 1 diabetes mellitus with hyperglycemia: Secondary | ICD-10-CM | POA: Diagnosis not present

## 2016-10-13 DIAGNOSIS — K3184 Gastroparesis: Secondary | ICD-10-CM | POA: Diagnosis not present

## 2016-10-25 DIAGNOSIS — E7800 Pure hypercholesterolemia, unspecified: Secondary | ICD-10-CM | POA: Diagnosis not present

## 2016-10-25 DIAGNOSIS — I10 Essential (primary) hypertension: Secondary | ICD-10-CM | POA: Diagnosis not present

## 2016-10-25 DIAGNOSIS — E1140 Type 2 diabetes mellitus with diabetic neuropathy, unspecified: Secondary | ICD-10-CM | POA: Diagnosis not present

## 2016-10-25 DIAGNOSIS — E1065 Type 1 diabetes mellitus with hyperglycemia: Secondary | ICD-10-CM | POA: Diagnosis not present

## 2016-11-03 DIAGNOSIS — Z79899 Other long term (current) drug therapy: Secondary | ICD-10-CM | POA: Diagnosis not present

## 2016-11-03 DIAGNOSIS — M47816 Spondylosis without myelopathy or radiculopathy, lumbar region: Secondary | ICD-10-CM | POA: Diagnosis not present

## 2016-11-03 DIAGNOSIS — G894 Chronic pain syndrome: Secondary | ICD-10-CM | POA: Diagnosis not present

## 2016-11-03 DIAGNOSIS — M545 Low back pain: Secondary | ICD-10-CM | POA: Diagnosis not present

## 2016-11-03 DIAGNOSIS — Z79891 Long term (current) use of opiate analgesic: Secondary | ICD-10-CM | POA: Diagnosis not present

## 2016-12-01 DIAGNOSIS — M47816 Spondylosis without myelopathy or radiculopathy, lumbar region: Secondary | ICD-10-CM | POA: Diagnosis not present

## 2016-12-01 DIAGNOSIS — G894 Chronic pain syndrome: Secondary | ICD-10-CM | POA: Diagnosis not present

## 2016-12-01 DIAGNOSIS — Z79891 Long term (current) use of opiate analgesic: Secondary | ICD-10-CM | POA: Diagnosis not present

## 2016-12-01 DIAGNOSIS — M545 Low back pain: Secondary | ICD-10-CM | POA: Diagnosis not present

## 2016-12-01 DIAGNOSIS — Z79899 Other long term (current) drug therapy: Secondary | ICD-10-CM | POA: Diagnosis not present

## 2016-12-17 DIAGNOSIS — E7800 Pure hypercholesterolemia, unspecified: Secondary | ICD-10-CM | POA: Diagnosis not present

## 2016-12-22 DIAGNOSIS — I10 Essential (primary) hypertension: Secondary | ICD-10-CM | POA: Diagnosis not present

## 2016-12-22 DIAGNOSIS — R0989 Other specified symptoms and signs involving the circulatory and respiratory systems: Secondary | ICD-10-CM | POA: Diagnosis not present

## 2016-12-22 DIAGNOSIS — I2510 Atherosclerotic heart disease of native coronary artery without angina pectoris: Secondary | ICD-10-CM | POA: Diagnosis not present

## 2016-12-22 DIAGNOSIS — I209 Angina pectoris, unspecified: Secondary | ICD-10-CM | POA: Diagnosis not present

## 2016-12-29 DIAGNOSIS — G894 Chronic pain syndrome: Secondary | ICD-10-CM | POA: Diagnosis not present

## 2016-12-29 DIAGNOSIS — Z79891 Long term (current) use of opiate analgesic: Secondary | ICD-10-CM | POA: Diagnosis not present

## 2016-12-29 DIAGNOSIS — Z79899 Other long term (current) drug therapy: Secondary | ICD-10-CM | POA: Diagnosis not present

## 2016-12-29 DIAGNOSIS — L720 Epidermal cyst: Secondary | ICD-10-CM | POA: Diagnosis not present

## 2016-12-29 DIAGNOSIS — M545 Low back pain: Secondary | ICD-10-CM | POA: Diagnosis not present

## 2016-12-29 DIAGNOSIS — M47816 Spondylosis without myelopathy or radiculopathy, lumbar region: Secondary | ICD-10-CM | POA: Diagnosis not present

## 2017-01-01 ENCOUNTER — Encounter (HOSPITAL_COMMUNITY): Payer: Self-pay | Admitting: *Deleted

## 2017-01-01 ENCOUNTER — Ambulatory Visit (HOSPITAL_COMMUNITY)
Admission: EM | Admit: 2017-01-01 | Discharge: 2017-01-01 | Disposition: A | Discharge status: Home / Self Care | Payer: Medicare Other | Attending: Family Medicine | Admitting: Family Medicine

## 2017-01-01 DIAGNOSIS — H109 Unspecified conjunctivitis: Secondary | ICD-10-CM

## 2017-01-01 MED ORDER — POLYMYXIN B-TRIMETHOPRIM 10000-0.1 UNIT/ML-% OP SOLN: 1.0000 [drp] | OPHTHALMIC | 0 refills | Status: AC

## 2017-01-01 NOTE — ED Notes (Signed)
Visual  Acuity  20/200  Left    20/50  Right

## 2017-01-01 NOTE — Discharge Instructions (Signed)
Please see an ophthalmologist if no improvement is noted in 2-3 days, sooner if your symptoms get worse.

## 2017-01-01 NOTE — ED Triage Notes (Signed)
Pt  Reports  Pain   Redness   And  Drainage from   r  Eye  X  3  Days  Pt  Reports  The   Symptoms  Started  In her  l   Eye      And  Moved  To the  r  Pt  Has  Poor  Vision in  l  Eye  From  Previous   Surgeries  In  Past

## 2017-01-01 NOTE — ED Provider Notes (Signed)
CSN: [id number]     Arrival date & time 01/01/17  1610 History   First MD Initiated Contact with Patient 01/01/17 1641     Chief Complaint  Patient presents with  . Eye Problem   (Consider location/radiation/quality/duration/timing/severity/associated sxs/prior Treatment) Patient presents today complaining of left eye pain and pressure accompany by discharge and redness onset 3 days ago. Yesterday, she noticed her right eye started getting red as well. She states that she woke up this morning with right eye matted shut. She denies fever. She endorses blurry vision but no different than baseline, states her left eye stays blurry. She denies eye itchiness. She denies any URI symptoms.        Past Medical History:  Diagnosis Date  . Anxiety   . Arthritis   . Blood transfusion   . Brain injury ([place])    1 1/2 YR AGO  . Chronic constipation   . Chronic stomach ulcer   . Concussion    HAS HAD DIZZINESS/ MEMORY PROBLEMS/ SLOW SPEACH /BALANCE PROBLEMS SINCE CONCUSSION 1 1/2 YR AGO PR GOES TO THERAPY  . Concussion   . Depression   . Diabetes mellitus   . Diabetes mellitus   . Dizziness   . Fibromyalgia   . Full dentures   . GERD (gastroesophageal reflux disease)   . Headache(784.0)   . Hernia    incisional  . History of back surgery    multiple  . Hyperlipidemia   . Hypertension   . MRSA (methicillin resistant Staphylococcus aureus)   . Nocturia   . Paroxysmal vertigo   . Scar   . Sleep difficulties   . Stroke ([place])    crica 2002  . Umbilical hernia   . Wears glasses    Past Surgical History:  Procedure Laterality Date  . ABDOMINAL HYSTERECTOMY    . APPENDECTOMY    . BACK SURGERY     SPINAL FUSION X6   . CARDIAC CATHETERIZATION N/A 12/23/2015   Procedure: Left Heart Cath and Coronary Angiography;  Surgeon: Adrian Prows, MD;  Location: [place] [facility];  Service: Cardiovascular;  Laterality: N/A;  . CEREBRAL ANEURYSM REPAIR    . CHOLECYSTECTOMY  1984  . EYE SURGERY   2010   bilateral  . HERNIA REPAIR     09/21/2010  . HERNIA REPAIR  05/19/11   incisional hernia repair   . INCISIONAL HERNIA REPAIR  05/19/2011   Procedure: HERNIA REPAIR INCISIONAL;  Surgeon: Willey Blade, MD;  Location: [facility];  Service: General;  Laterality: N/A;  repair incisional hernia with mesh  . IR GENERIC HISTORICAL  09/30/2016   IR ANGIO VERTEBRAL SEL VERTEBRAL BILAT MOD SED 09/30/2016 Luanne Bras, MD [facility]  . IR GENERIC HISTORICAL  09/30/2016   IR ANGIO INTRA EXTRACRAN SEL COM CAROTID INNOMINATE BILAT MOD SED 09/30/2016 Luanne Bras, MD [facility]  . LEFT HEART CATHETERIZATION WITH CORONARY ANGIOGRAM N/A 11/05/2013   Procedure: LEFT HEART CATHETERIZATION WITH CORONARY ANGIOGRAM;  Surgeon: Laverda Page, MD;  Location: [facility] CATH LAB;  Service: Cardiovascular;  Laterality: N/A;  . SPINAL FUSION  2006  . TUBAL LIGATION     Family History  Problem Relation Age of Onset  . Breast cancer Mother   . Heart disease Father   . Allergies Daughter    Social History  Substance Use Topics  . Smoking status: Current Every Day Smoker    Packs/day: 0.50    Years: 38.00    Types: Cigarettes  . Smokeless  tobacco: Never Used  . Alcohol use No   OB History    No data available     Review of Systems  Constitutional:       See HPI    Allergies  Cymbalta [duloxetine hcl] and Latex  Home Medications   Prior to Admission medications   Medication Sig Start Date End Date Taking? Authorizing Provider  ALPRAZolam Duanne Moron) 1 MG tablet Take 1 mg by mouth 3 (three) times daily as needed for anxiety.  10/01/13   [provider]  Ascorbic Acid (VITAMIN C) 1000 MG tablet Take 1,000 mg by mouth daily.    [provider]  atorvastatin (LIPITOR) 40 MG tablet Take 40 mg by mouth daily at 6 PM.     [provider]  baclofen (LIORESAL) 10 MG tablet Take 10 mg by mouth 2 (two) times daily.  06/18/15   [provider]  Biotin (BIOTIN  MAXIMUM STRENGTH) 10 MG TABS Take 10 mg by mouth daily.     [provider]  chlorthalidone (HYGROTON) 25 MG tablet Take 25 mg by mouth daily. 11/20/15   [provider]  desvenlafaxine (PRISTIQ) 100 MG 24 hr tablet Take 100 mg by mouth daily.     [provider]  esomeprazole (NEXIUM) 40 MG capsule Take 40 mg by mouth every evening.     [provider]  estradiol (ESTRACE) 1 MG tablet Take 1 mg by mouth daily.    [provider]  gabapentin (NEURONTIN) 600 MG tablet Take 1,200 mg by mouth 3 (three) times daily.     [provider]  ibuprofen (ADVIL,MOTRIN) 800 MG tablet Take 800 mg by mouth daily as needed for mild pain.     [provider]  insulin glargine (LANTUS) 100 UNIT/ML injection Inject 10-40 Units into the skin 2 (two) times daily. 10 units in the morning, and 40 units at bedtime    [provider]  insulin lispro (HUMALOG) 100 UNIT/ML injection Inject 12-18 Units into the skin 3 (three) times daily before meals. Sliding scale    [provider]  lubiprostone (AMITIZA) 24 MCG capsule Take 24 mcg by mouth 2 (two) times daily with a meal.     [provider]  meclizine (ANTIVERT) 12.5 MG tablet Take 1 tablet (12.5 mg total) by mouth 3 (three) times daily as needed for dizziness. 06/23/15   Lajean Saver, MD  nitroGLYCERIN (NITROLINGUAL) 0.4 MG/SPRAY spray Place 1 spray under the tongue every 5 (five) minutes x 3 doses as needed for chest pain. 10/31/13   Nita Sells, MD  olmesartan (BENICAR) 40 MG tablet Take 40 mg by mouth every evening. 06/18/15   [provider]  oxyCODONE 30 MG 12 hr tablet Take 1 tablet by mouth 2 (two) times daily.    [provider]  oxyCODONE-acetaminophen (PERCOCET) 10-325 MG per tablet Take 1 tablet by mouth 4 (four) times daily - after meals and at bedtime.     [provider]  RESTASIS 0.05 % ophthalmic emulsion Place 2 drops into both eyes 2  (two) times daily.  10/08/13   [provider]  solifenacin (VESICARE) 10 MG tablet Take 10 mg by mouth daily.    [provider]  torsemide (DEMADEX) 20 MG tablet Take 20 mg by mouth every morning.     [provider]  trimethoprim-polymyxin b (POLYTRIM) ophthalmic solution Place 1 drop into both eyes every 4 (four) hours. 01/01/17 01/08/17  Barry Dienes, NP  trimipramine ([place])  100 MG capsule Take 100 mg by mouth at bedtime.    [provider]  zolpidem (AMBIEN CR) 12.5 MG CR tablet Take 12.5 mg by mouth at bedtime.     [provider]   Meds Ordered and Administered this Visit  Medications - No data to display  BP 130/70 (BP Location: Right Arm)   Pulse 78   Temp 98.6 F (37 C) (Oral)   Resp 18   SpO2 99%  No data found.   Physical Exam  Constitutional: She is oriented to person, place, and time. She appears well-developed and well-nourished.  HENT:  Head: Normocephalic and atraumatic.  Right Ear: External ear normal.  Left Ear: External ear normal.  Nose: Nose normal.  Mouth/Throat: Oropharynx is clear and moist. No oropharyngeal exudate.  Eyes: Conjunctivae and EOM are normal. Right eye exhibits no discharge. Left eye exhibits no discharge.  No eyelid swelling. No discharge noted. Right conjunctivae is slightly red.   Cardiovascular: Normal rate, regular rhythm and normal heart sounds.   Pulmonary/Chest: Effort normal and breath sounds normal.  Neurological: She is alert and oriented to person, place, and time.  Skin: Skin is warm and dry.  Nursing note and vitals reviewed.   Urgent Care Course     Procedures (including critical care time)  Labs Review Labs Reviewed - No data to display  Imaging Review No results found.  MDM   1. Bacterial conjunctivitis   Visual acuity reviewed (L 20/200 R 20/50). Patient reports that she normally has poor vision, especially the left eye. She states that her vision is not any worse  than her baseline.   RX for Polytrim send in.  Reviewed directions for usage and side effects. Patient states understanding and will call with questions or problems. Patient instructed to call or follow up with ophthalmologist if  failure to improve or change in symptoms. Discharge instruction given.       Barry Dienes, NP 01/01/17 1659

## 2017-01-26 DIAGNOSIS — G894 Chronic pain syndrome: Secondary | ICD-10-CM | POA: Diagnosis not present

## 2017-01-26 DIAGNOSIS — Z79899 Other long term (current) drug therapy: Secondary | ICD-10-CM | POA: Diagnosis not present

## 2017-01-26 DIAGNOSIS — M47816 Spondylosis without myelopathy or radiculopathy, lumbar region: Secondary | ICD-10-CM | POA: Diagnosis not present

## 2017-01-26 DIAGNOSIS — M545 Low back pain: Secondary | ICD-10-CM | POA: Diagnosis not present

## 2017-01-26 DIAGNOSIS — Z79891 Long term (current) use of opiate analgesic: Secondary | ICD-10-CM | POA: Diagnosis not present

## 2017-02-23 DIAGNOSIS — Z79899 Other long term (current) drug therapy: Secondary | ICD-10-CM | POA: Diagnosis not present

## 2017-02-23 DIAGNOSIS — M47816 Spondylosis without myelopathy or radiculopathy, lumbar region: Secondary | ICD-10-CM | POA: Diagnosis not present

## 2017-02-23 DIAGNOSIS — Z79891 Long term (current) use of opiate analgesic: Secondary | ICD-10-CM | POA: Diagnosis not present

## 2017-02-23 DIAGNOSIS — M545 Low back pain: Secondary | ICD-10-CM | POA: Diagnosis not present

## 2017-02-23 DIAGNOSIS — G894 Chronic pain syndrome: Secondary | ICD-10-CM | POA: Diagnosis not present

## 2017-02-24 DIAGNOSIS — E1140 Type 2 diabetes mellitus with diabetic neuropathy, unspecified: Secondary | ICD-10-CM | POA: Diagnosis not present

## 2017-02-24 DIAGNOSIS — E1065 Type 1 diabetes mellitus with hyperglycemia: Secondary | ICD-10-CM | POA: Diagnosis not present

## 2017-02-24 DIAGNOSIS — I10 Essential (primary) hypertension: Secondary | ICD-10-CM | POA: Diagnosis not present

## 2017-02-24 DIAGNOSIS — E7800 Pure hypercholesterolemia, unspecified: Secondary | ICD-10-CM | POA: Diagnosis not present

## 2017-03-02 DIAGNOSIS — R0989 Other specified symptoms and signs involving the circulatory and respiratory systems: Secondary | ICD-10-CM | POA: Diagnosis not present

## 2017-03-14 ENCOUNTER — Telehealth: Payer: Self-pay | Admitting: Gastroenterology

## 2017-03-14 NOTE — Telephone Encounter (Signed)
RECALL FOR TCS °

## 2017-03-14 NOTE — Telephone Encounter (Signed)
Letter mailed to pt.  

## 2017-03-22 DIAGNOSIS — I2510 Atherosclerotic heart disease of native coronary artery without angina pectoris: Secondary | ICD-10-CM | POA: Diagnosis not present

## 2017-03-22 DIAGNOSIS — I209 Angina pectoris, unspecified: Secondary | ICD-10-CM | POA: Diagnosis not present

## 2017-03-22 DIAGNOSIS — R0989 Other specified symptoms and signs involving the circulatory and respiratory systems: Secondary | ICD-10-CM | POA: Diagnosis not present

## 2017-03-22 DIAGNOSIS — I10 Essential (primary) hypertension: Secondary | ICD-10-CM | POA: Diagnosis not present

## 2017-03-23 DIAGNOSIS — M545 Low back pain: Secondary | ICD-10-CM | POA: Diagnosis not present

## 2017-03-23 DIAGNOSIS — Z79891 Long term (current) use of opiate analgesic: Secondary | ICD-10-CM | POA: Diagnosis not present

## 2017-03-23 DIAGNOSIS — G894 Chronic pain syndrome: Secondary | ICD-10-CM | POA: Diagnosis not present

## 2017-03-23 DIAGNOSIS — M47816 Spondylosis without myelopathy or radiculopathy, lumbar region: Secondary | ICD-10-CM | POA: Diagnosis not present

## 2017-04-20 DIAGNOSIS — G894 Chronic pain syndrome: Secondary | ICD-10-CM | POA: Diagnosis not present

## 2017-04-20 DIAGNOSIS — M47816 Spondylosis without myelopathy or radiculopathy, lumbar region: Secondary | ICD-10-CM | POA: Diagnosis not present

## 2017-04-20 DIAGNOSIS — Z79899 Other long term (current) drug therapy: Secondary | ICD-10-CM | POA: Diagnosis not present

## 2017-04-20 DIAGNOSIS — M545 Low back pain: Secondary | ICD-10-CM | POA: Diagnosis not present

## 2017-04-20 DIAGNOSIS — Z79891 Long term (current) use of opiate analgesic: Secondary | ICD-10-CM | POA: Diagnosis not present

## 2017-05-04 DIAGNOSIS — R634 Abnormal weight loss: Secondary | ICD-10-CM | POA: Diagnosis not present

## 2017-05-04 DIAGNOSIS — Z1231 Encounter for screening mammogram for malignant neoplasm of breast: Secondary | ICD-10-CM | POA: Diagnosis not present

## 2017-05-04 DIAGNOSIS — N3281 Overactive bladder: Secondary | ICD-10-CM | POA: Diagnosis not present

## 2017-05-04 DIAGNOSIS — N3000 Acute cystitis without hematuria: Secondary | ICD-10-CM | POA: Diagnosis not present

## 2017-05-04 DIAGNOSIS — I10 Essential (primary) hypertension: Secondary | ICD-10-CM | POA: Diagnosis not present

## 2017-05-04 DIAGNOSIS — E1065 Type 1 diabetes mellitus with hyperglycemia: Secondary | ICD-10-CM | POA: Diagnosis not present

## 2017-05-04 DIAGNOSIS — F17200 Nicotine dependence, unspecified, uncomplicated: Secondary | ICD-10-CM | POA: Diagnosis not present

## 2017-05-04 DIAGNOSIS — F0781 Postconcussional syndrome: Secondary | ICD-10-CM | POA: Diagnosis not present

## 2017-05-04 DIAGNOSIS — E7800 Pure hypercholesterolemia, unspecified: Secondary | ICD-10-CM | POA: Diagnosis not present

## 2017-05-04 DIAGNOSIS — Z23 Encounter for immunization: Secondary | ICD-10-CM | POA: Diagnosis not present

## 2017-05-04 DIAGNOSIS — F411 Generalized anxiety disorder: Secondary | ICD-10-CM | POA: Diagnosis not present

## 2017-05-04 DIAGNOSIS — M545 Low back pain: Secondary | ICD-10-CM | POA: Diagnosis not present

## 2017-05-04 DIAGNOSIS — Z1211 Encounter for screening for malignant neoplasm of colon: Secondary | ICD-10-CM | POA: Diagnosis not present

## 2017-05-05 ENCOUNTER — Other Ambulatory Visit: Payer: Self-pay | Admitting: Family Medicine

## 2017-05-05 DIAGNOSIS — Z139 Encounter for screening, unspecified: Secondary | ICD-10-CM

## 2017-05-18 DIAGNOSIS — M545 Low back pain: Secondary | ICD-10-CM | POA: Diagnosis not present

## 2017-05-18 DIAGNOSIS — G894 Chronic pain syndrome: Secondary | ICD-10-CM | POA: Diagnosis not present

## 2017-05-18 DIAGNOSIS — M47816 Spondylosis without myelopathy or radiculopathy, lumbar region: Secondary | ICD-10-CM | POA: Diagnosis not present

## 2017-06-15 DIAGNOSIS — G894 Chronic pain syndrome: Secondary | ICD-10-CM | POA: Diagnosis not present

## 2017-06-15 DIAGNOSIS — Z79891 Long term (current) use of opiate analgesic: Secondary | ICD-10-CM | POA: Diagnosis not present

## 2017-06-15 DIAGNOSIS — M47816 Spondylosis without myelopathy or radiculopathy, lumbar region: Secondary | ICD-10-CM | POA: Diagnosis not present

## 2017-06-15 DIAGNOSIS — M545 Low back pain: Secondary | ICD-10-CM | POA: Diagnosis not present

## 2017-06-15 DIAGNOSIS — Z79899 Other long term (current) drug therapy: Secondary | ICD-10-CM | POA: Diagnosis not present

## 2017-06-17 DIAGNOSIS — Z1211 Encounter for screening for malignant neoplasm of colon: Secondary | ICD-10-CM | POA: Diagnosis not present

## 2017-07-14 DIAGNOSIS — M545 Low back pain: Secondary | ICD-10-CM | POA: Diagnosis not present

## 2017-07-14 DIAGNOSIS — Z79899 Other long term (current) drug therapy: Secondary | ICD-10-CM | POA: Diagnosis not present

## 2017-07-14 DIAGNOSIS — Z79891 Long term (current) use of opiate analgesic: Secondary | ICD-10-CM | POA: Diagnosis not present

## 2017-07-14 DIAGNOSIS — G894 Chronic pain syndrome: Secondary | ICD-10-CM | POA: Diagnosis not present

## 2017-07-14 DIAGNOSIS — M47816 Spondylosis without myelopathy or radiculopathy, lumbar region: Secondary | ICD-10-CM | POA: Diagnosis not present

## 2017-08-05 ENCOUNTER — Emergency Department (HOSPITAL_COMMUNITY): Payer: Medicare Other

## 2017-08-05 ENCOUNTER — Encounter (HOSPITAL_COMMUNITY): Payer: Self-pay | Admitting: *Deleted

## 2017-08-05 ENCOUNTER — Other Ambulatory Visit: Payer: Self-pay

## 2017-08-05 ENCOUNTER — Emergency Department (HOSPITAL_COMMUNITY)
Admission: EM | Admit: 2017-08-05 | Discharge: 2017-08-05 | Disposition: A | Discharge status: Home / Self Care | Payer: Medicare Other | Attending: Emergency Medicine | Admitting: Emergency Medicine

## 2017-08-05 DIAGNOSIS — Z794 Long term (current) use of insulin: Secondary | ICD-10-CM | POA: Insufficient documentation

## 2017-08-05 DIAGNOSIS — Z79899 Other long term (current) drug therapy: Secondary | ICD-10-CM | POA: Diagnosis not present

## 2017-08-05 DIAGNOSIS — S82892A Other fracture of left lower leg, initial encounter for closed fracture: Secondary | ICD-10-CM

## 2017-08-05 DIAGNOSIS — F17210 Nicotine dependence, cigarettes, uncomplicated: Secondary | ICD-10-CM | POA: Diagnosis not present

## 2017-08-05 DIAGNOSIS — S92155A Nondisplaced avulsion fracture (chip fracture) of left talus, initial encounter for closed fracture: Secondary | ICD-10-CM | POA: Diagnosis not present

## 2017-08-05 DIAGNOSIS — Y939 Activity, unspecified: Secondary | ICD-10-CM | POA: Diagnosis not present

## 2017-08-05 DIAGNOSIS — Y999 Unspecified external cause status: Secondary | ICD-10-CM | POA: Insufficient documentation

## 2017-08-05 DIAGNOSIS — I119 Hypertensive heart disease without heart failure: Secondary | ICD-10-CM | POA: Insufficient documentation

## 2017-08-05 DIAGNOSIS — S92145A Nondisplaced dome fracture of left talus, initial encounter for closed fracture: Secondary | ICD-10-CM | POA: Diagnosis not present

## 2017-08-05 DIAGNOSIS — X500XXA Overexertion from strenuous movement or load, initial encounter: Secondary | ICD-10-CM | POA: Diagnosis not present

## 2017-08-05 DIAGNOSIS — E119 Type 2 diabetes mellitus without complications: Secondary | ICD-10-CM | POA: Insufficient documentation

## 2017-08-05 DIAGNOSIS — Y92009 Unspecified place in unspecified non-institutional (private) residence as the place of occurrence of the external cause: Secondary | ICD-10-CM | POA: Diagnosis not present

## 2017-08-05 DIAGNOSIS — I2510 Atherosclerotic heart disease of native coronary artery without angina pectoris: Secondary | ICD-10-CM | POA: Insufficient documentation

## 2017-08-05 DIAGNOSIS — S99912A Unspecified injury of left ankle, initial encounter: Secondary | ICD-10-CM | POA: Diagnosis present

## 2017-08-05 NOTE — ED Notes (Signed)
Patient transported to X-ray 

## 2017-08-05 NOTE — ED Triage Notes (Signed)
Pt reports tripping on object last night and now has pain to left ankle and top of her foot. Ambulatory at triage.

## 2017-08-05 NOTE — ED Provider Notes (Signed)
[facility] EMERGENCY DEPARTMENT Provider Note   CSN: [id number] Arrival date & time: 08/05/17  0759     History   Chief Complaint Chief Complaint  Patient presents with  . Ankle Pain  . Foot Pain    HPI Mallory Mendez is a 62 y.o. female.  The history is provided by the patient. No language interpreter was used.  Ankle Pain   The incident occurred yesterday. The incident occurred at home. The injury mechanism was torsion. The pain is present in the left ankle. The quality of the pain is described as aching. The pain is moderate. The pain has been constant since onset. Pertinent negatives include no loss of motion. She reports no foreign bodies present. Nothing aggravates the symptoms. She has tried nothing for the symptoms. The treatment provided no relief.  Foot Pain   Pt reports she stepped on Grandchilds toy and twisted ankle.  Pt complains of swelling and pain  Past Medical History:  Diagnosis Date  . Anxiety   . Arthritis   . Blood transfusion   . Brain injury ([place])    1 1/2 YR AGO  . Chronic constipation   . Chronic stomach ulcer   . Concussion    HAS HAD DIZZINESS/ MEMORY PROBLEMS/ SLOW SPEACH /BALANCE PROBLEMS SINCE CONCUSSION 1 1/2 YR AGO PR GOES TO THERAPY  . Concussion   . Depression   . Diabetes mellitus   . Diabetes mellitus   . Dizziness   . Fibromyalgia   . Full dentures   . GERD (gastroesophageal reflux disease)   . Headache(784.0)   . Hernia    incisional  . History of back surgery    multiple  . Hyperlipidemia   . Hypertension   . MRSA (methicillin resistant Staphylococcus aureus)   . Nocturia   . Paroxysmal vertigo   . Scar   . Sleep difficulties   . Stroke ([place])    crica 2002  . Umbilical hernia   . Wears glasses     Patient Active Problem List   Diagnosis Date Noted  . CAD (coronary artery disease), native coronary artery 12/23/2015  . Angina pectoris ([place]) 12/22/2015  . Chest pain 10/30/2013  . 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 Dyspnea 09/21/2013  . Smoker 09/21/2013  . HBP (high blood pressure) 09/21/2013  . Myofascial muscle pain 08/20/2013  . Basilar artery narrowing 07/28/2012  . Anxiety and depression 05/30/2012  . Abdominal pain 12/01/2011  . Concussion   . Diabetes mellitus   . Paroxysmal vertigo   . Umbilical hernia without mention of obstruction or gangrene 04/06/2011  . Post concussive syndrome 03/29/2011  . Benign paroxysmal positional vertigo 01/27/2011    Past Surgical History:  Procedure Laterality Date  . ABDOMINAL HYSTERECTOMY    . APPENDECTOMY    . BACK SURGERY     SPINAL FUSION X6   . CARDIAC CATHETERIZATION N/A 12/23/2015   Procedure: Left Heart Cath and Coronary Angiography;  Surgeon: Adrian Prows, MD;  Location: [facility];  Service: Cardiovascular;  Laterality: N/A;  . CEREBRAL ANEURYSM REPAIR    . CHOLECYSTECTOMY  1984  . EYE SURGERY  2010   bilateral  . HERNIA REPAIR     09/21/2010  . HERNIA REPAIR  05/19/11   incisional hernia repair   . INCISIONAL HERNIA REPAIR  05/19/2011   Procedure: HERNIA REPAIR INCISIONAL;  Surgeon: Willey Blade, MD;  Location: [facility];  Service: General;  Laterality: N/A;  repair incisional hernia with mesh  . IR GENERIC HISTORICAL  09/30/2016   IR ANGIO VERTEBRAL SEL VERTEBRAL BILAT MOD SED 09/30/2016 Luanne Bras, MD [facility]  . IR GENERIC HISTORICAL  09/30/2016   IR ANGIO INTRA EXTRACRAN SEL COM CAROTID INNOMINATE BILAT MOD SED 09/30/2016 Luanne Bras, MD [facility]  . LEFT HEART CATHETERIZATION WITH CORONARY ANGIOGRAM N/A 11/05/2013   Procedure: LEFT HEART CATHETERIZATION WITH CORONARY ANGIOGRAM;  Surgeon: Laverda Page, MD;  Location: [facility];  Service: Cardiovascular;  Laterality: N/A;  . SPINAL FUSION  2006  . TUBAL LIGATION      OB History    No data available       Home Medications    Prior to Admission medications   Medication  Sig Start Date End Date Taking? Authorizing Provider  ALPRAZolam Duanne Moron) 1 MG tablet Take 1 mg by mouth 3 (three) times daily as needed for anxiety.  10/01/13   [provider]  Ascorbic Acid (VITAMIN C) 1000 MG tablet Take 1,000 mg by mouth daily.    [provider]  atorvastatin (LIPITOR) 40 MG tablet Take 40 mg by mouth daily at 6 PM.     [provider]  baclofen (LIORESAL) 10 MG tablet Take 10 mg by mouth 2 (two) times daily.  06/18/15   [provider]  Biotin (BIOTIN MAXIMUM STRENGTH) 10 MG TABS Take 10 mg by mouth daily.     [provider]  chlorthalidone (HYGROTON) 25 MG tablet Take 25 mg by mouth daily. 11/20/15   [provider]  desvenlafaxine (PRISTIQ) 100 MG 24 hr tablet Take 100 mg by mouth daily.     [provider]  esomeprazole (NEXIUM) 40 MG capsule Take 40 mg by mouth every evening.     [provider]  estradiol (ESTRACE) 1 MG tablet Take 1 mg by mouth daily.    [provider]  gabapentin (NEURONTIN) 600 MG tablet Take 1,200 mg by mouth 3 (three) times daily.     [provider]  ibuprofen (ADVIL,MOTRIN) 800 MG tablet Take 800 mg by mouth daily as needed for mild pain.     [provider]  insulin glargine (LANTUS) 100 UNIT/ML injection Inject 10-40 Units into the skin 2 (two) times daily. 10 units in the morning, and 40 units at bedtime    [provider]  insulin lispro (HUMALOG) 100 UNIT/ML injection Inject 12-18 Units into the skin 3 (three) times daily before meals. Sliding scale    [provider]  lubiprostone (AMITIZA) 24 MCG capsule Take 24 mcg by mouth 2 (two) times daily with a meal.     [provider]  meclizine (ANTIVERT) 12.5 MG tablet Take 1 tablet (12.5 mg total) by mouth 3 (three) times daily as needed for dizziness. 06/23/15   Lajean Saver, MD  nitroGLYCERIN (NITROLINGUAL) 0.4 MG/SPRAY spray Place 1 spray under the tongue every 5 (five)  minutes x 3 doses as needed for chest pain. 10/31/13   Nita Sells, MD  olmesartan (BENICAR) 40 MG tablet Take 40 mg by mouth every evening. 06/18/15   [provider]  oxyCODONE 30 MG 12 hr tablet Take 1 tablet by mouth 2 (two) times daily.    [provider]  oxyCODONE-acetaminophen (PERCOCET) 10-325 MG per tablet Take 1 tablet by mouth 4 (four) times daily - after meals and at bedtime.     [provider]  RESTASIS 0.05 % ophthalmic emulsion Place 2 drops  into both eyes 2 (two) times daily.  10/08/13   [provider]  solifenacin (VESICARE) 10 MG tablet Take 10 mg by mouth daily.    [provider]  torsemide (DEMADEX) 20 MG tablet Take 20 mg by mouth every morning.     [provider]  trimipramine (SURMONTIL) 100 MG capsule Take 100 mg by mouth at bedtime.    [provider]  zolpidem (AMBIEN CR) 12.5 MG CR tablet Take 12.5 mg by mouth at bedtime.     [provider]    Family History Family History  Problem Relation Age of Onset  . Breast cancer Mother   . Heart disease Father   . Allergies Daughter     Social History Social History   Tobacco Use  . Smoking status: Current Every Day Smoker    Packs/day: 0.50    Years: 38.00    Pack years: 19.00    Types: Cigarettes  . Smokeless tobacco: Never Used  Substance Use Topics  . Alcohol use: No  . Drug use: No     Allergies   Cymbalta [duloxetine hcl] and Latex   Review of Systems Review of Systems  All other systems reviewed and are negative.    Physical Exam Updated Vital Signs BP 135/74 (BP Location: Right Arm)   Pulse 67   Temp 98.3 F (36.8 C) (Oral)   Resp 17   SpO2 99%   Physical Exam  Constitutional: She is oriented to person, place, and time. She appears well-developed and well-nourished.  HENT:  Head: Normocephalic.  Musculoskeletal: She exhibits tenderness.  Swollen tender left lateral malleolus,  Pain with movement,  nv  and ns intact   Neurological: She is alert and oriented to person, place, and time.  Skin: Skin is warm.  Psychiatric: She has a normal mood and affect.  Nursing note and vitals reviewed.    ED Treatments / Results  Labs (all labs ordered are listed, but only abnormal results are displayed) Labs Reviewed - No data to display  EKG  EKG Interpretation None       Radiology Dg Ankle Complete Left  Result Date: 08/05/2017 CLINICAL DATA:  Tripped and fell last pm.pain,swelling lateral malleolus left ankle EXAM: LEFT ANKLE COMPLETE - 3+ VIEW COMPARISON:  None. FINDINGS: Small avulsion fractures seen from the lateral process of the talus, nondisplaced and non comminuted. No other fractures.  No bone lesions. The ankle joint is normally spaced and aligned. No arthropathic changes. There are dorsal plantar calcaneal spurs. Lateral soft tissue swelling is noted.  The IMPRESSION: 1. Small nondisplaced avulsion fracture from the lateral process of talus. 2. No other fractures.  No dislocation. Electronically Signed   By: Lajean Manes M.D.   On: 08/05/2017 09:01   Dg Foot Complete Left  Result Date: 08/05/2017 CLINICAL DATA:  62 year old female status post trip and fall last night. Pain at the proximal 3rd through 5th metatarsals. EXAM: LEFT FOOT - COMPLETE 3+ VIEW COMPARISON:  Left ankle series 10/06/2005. FINDINGS: Bone mineralization is within normal limits for age. Calcaneus is intact with chronic degenerative spurring. Other tarsal bones appear intact with normal joint spaces. The proximal metatarsals appear intact and normally aligned. TMT joint spaces appear normal. The mid and distal metatarsals appear intact; there is evidence of a healed 5th metatarsal distal shaft fracture. Phalanges appear intact and normally aligned. No acute osseous abnormality identified. IMPRESSION: No acute fracture or dislocation identified about the left foot. Follow-up radiographs are recommended if  symptoms persist.  Electronically Signed   By: Genevie Ann M.D.   On: 08/05/2017 08:53    Procedures Procedures (including critical care time)  Medications Ordered in ED Medications - No data to display   Initial Impression / Assessment and Plan / ED Course  I have reviewed the triage vital signs and the nursing notes.  Pertinent labs & imaging results that were available during my care of the patient were reviewed by me and considered in my medical decision making (see chart for details).     MDM  Xray reviewed by me. Pt counseled on results.  Pt advised aso and crutches.  She will  need to follow up with her Orthopaedist Dr. Louanne Skye next week for recheck RICE Continue current home medications for pain  Final Clinical Impressions(s) / ED Diagnoses   Final diagnoses:  Avulsion fracture of ankle, left, closed, initial encounter    ED Discharge Orders    None    An After Visit Summary was printed and given to the patient.    Fransico Meadow, PA-C 08/05/17 0935    Orlie Dakin, MD 08/05/17 [phone]

## 2017-08-11 ENCOUNTER — Ambulatory Visit (INDEPENDENT_AMBULATORY_CARE_PROVIDER_SITE_OTHER): Payer: Medicaid Other | Admitting: Surgery

## 2017-08-11 ENCOUNTER — Encounter (INDEPENDENT_AMBULATORY_CARE_PROVIDER_SITE_OTHER): Payer: Self-pay | Admitting: Surgery

## 2017-08-11 DIAGNOSIS — S82892A Other fracture of left lower leg, initial encounter for closed fracture: Secondary | ICD-10-CM | POA: Diagnosis not present

## 2017-08-11 DIAGNOSIS — Z79891 Long term (current) use of opiate analgesic: Secondary | ICD-10-CM | POA: Diagnosis not present

## 2017-08-11 DIAGNOSIS — G894 Chronic pain syndrome: Secondary | ICD-10-CM | POA: Diagnosis not present

## 2017-08-11 DIAGNOSIS — M47816 Spondylosis without myelopathy or radiculopathy, lumbar region: Secondary | ICD-10-CM | POA: Diagnosis not present

## 2017-08-11 DIAGNOSIS — Z79899 Other long term (current) drug therapy: Secondary | ICD-10-CM | POA: Diagnosis not present

## 2017-08-11 NOTE — Progress Notes (Signed)
Office Visit Note   Patient: Mallory Mendez           Date of Birth: [birth year]           MRN: [id number] Visit Date: 08/11/2017              Requested by: Shirline Frees, MD [address], [place] [address] PCP: Shirline Frees, MD   Assessment & Plan: Visit Diagnoses:  1. Closed avulsion fracture of left ankle, initial encounter     Plan: Today patient was put in a Cam boot.  She can weight-bear as tolerated with crutches.  Will ice, elevate left foot is much as possible to help decrease pain and swelling.  No aggressive activity.  Follow-up in 2 weeks for recheck.  Follow-Up Instructions: Return in about 2 weeks (around 08/25/2017) for with Dr Lorin Mercy.   Orders:  No orders of the defined types were placed in this encounter.  No orders of the defined types were placed in this encounter.     Procedures: No procedures performed   Clinical Data: No additional findings.   Subjective: Chief Complaint  Patient presents with  . Left Ankle - Pain, Follow-up    HPI Today with complaints of left lateral ankle pain.  August 04, 2017 she was at home when she tripped over children's ball pit rolling her ankle.  She went to the most going to ED taken and showed a lateral avulsion fracture.  Put in a ASO brace.  States that the brace has been very uncomfortable.  Has been taking Percocet and ibuprofen as needed. No current cardiac pulmonary GI GU issues Objective: Vital Signs: There were no vitals taken for this visit.  Physical Exam  Constitutional: She is oriented to person, place, and time. She appears well-developed. No distress.  HENT:  Head: Normocephalic and atraumatic.  Eyes: EOM are normal. Pupils are equal, round, and reactive to light.  Neck: Normal range of motion.  Pulmonary/Chest: No respiratory distress.  Musculoskeletal:  Ankle decreased range of motion.  Does have some swelling of the ankle and foot.  trace bruising lateral foot and  ankle.  Difficult to assess ankle ligament stability due to pain and swelling.  Neurovascular intact.  Neurological: She is alert and oriented to person, place, and time.  Skin: Skin is warm and dry.    Ortho Exam  Specialty Comments:  No specialty comments available.  Imaging: No results found.   PMFS History: Patient Active Problem List   Diagnosis Date Noted  . CAD (coronary artery disease), native coronary artery 12/23/2015  . Angina pectoris ([place]) 12/22/2015  . Chest pain 10/30/2013  . 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[place]) 10/17/2013  . Dyspnea 09/21/2013  . Smoker 09/21/2013  . HBP (high blood pressure) 09/21/2013  . Myofascial muscle pain 08/20/2013  . Basilar artery narrowing 07/28/2012  . Anxiety and depression 05/30/2012  . Abdominal pain 12/01/2011  . Concussion   . Diabetes mellitus   . Paroxysmal vertigo   . Umbilical hernia without mention of obstruction or gangrene 04/06/2011  . Post concussive syndrome 03/29/2011  . Benign paroxysmal positional vertigo 01/27/2011   Past Medical History:  Diagnosis Date  . Anxiety   . Arthritis   . Blood transfusion   . Brain injury ([place])    1 1/2 YR AGO  . Chronic constipation   . Chronic stomach ulcer   . Concussion    HAS HAD DIZZINESS/ MEMORY PROBLEMS/ SLOW SPEACH /  BALANCE PROBLEMS SINCE CONCUSSION 1 1/2 YR AGO PR GOES TO THERAPY  . Concussion   . Depression   . Diabetes mellitus   . Diabetes mellitus   . Dizziness   . Fibromyalgia   . Full dentures   . GERD (gastroesophageal reflux disease)   . Headache(784.0)   . Hernia    incisional  . History of back surgery    multiple  . Hyperlipidemia   . Hypertension   . MRSA (methicillin resistant Staphylococcus aureus)   . Nocturia   . Paroxysmal vertigo   . Scar   . Sleep difficulties   . Stroke ([place])    crica 2002  . Umbilical hernia   . Wears glasses     Family History  Problem Relation Age of Onset  . Breast  cancer Mother   . Heart disease Father   . Allergies Daughter     Past Surgical History:  Procedure Laterality Date  . ABDOMINAL HYSTERECTOMY    . APPENDECTOMY    . BACK SURGERY     SPINAL FUSION X6   . CARDIAC CATHETERIZATION N/A 12/23/2015   Procedure: Left Heart Cath and Coronary Angiography;  Surgeon: Adrian Prows, MD;  Location: [facility];  Service: Cardiovascular;  Laterality: N/A;  . CEREBRAL ANEURYSM REPAIR    . CHOLECYSTECTOMY  1984  . EYE SURGERY  2010   bilateral  . HERNIA REPAIR     09/21/2010  . HERNIA REPAIR  05/19/11   incisional hernia repair   . INCISIONAL HERNIA REPAIR  05/19/2011   Procedure: HERNIA REPAIR INCISIONAL;  Surgeon: Willey Blade, MD;  Location: [facility];  Service: General;  Laterality: N/A;  repair incisional hernia with mesh  . IR GENERIC HISTORICAL  09/30/2016   IR ANGIO VERTEBRAL SEL VERTEBRAL BILAT MOD SED 09/30/2016 Luanne Bras, MD [facility]  . IR GENERIC HISTORICAL  09/30/2016   IR ANGIO INTRA EXTRACRAN SEL COM CAROTID INNOMINATE BILAT MOD SED 09/30/2016 Luanne Bras, MD [facility]  . LEFT HEART CATHETERIZATION WITH CORONARY ANGIOGRAM N/A 11/05/2013   Procedure: LEFT HEART CATHETERIZATION WITH CORONARY ANGIOGRAM;  Surgeon: Laverda Page, MD;  Location: [facility] CATH LAB;  Service: Cardiovascular;  Laterality: N/A;  . SPINAL FUSION  2006  . TUBAL LIGATION     Social History   Occupational History  . Occupation: disabled  Tobacco Use  . Smoking status: Current Every Day Smoker    Packs/day: 0.50    Years: 38.00    Pack years: 19.00    Types: Cigarettes  . Smokeless tobacco: Never Used  Substance and Sexual Activity  . Alcohol use: No  . Drug use: No  . Sexual activity: Not on file

## 2017-09-01 ENCOUNTER — Telehealth (HOSPITAL_COMMUNITY): Payer: Self-pay

## 2017-09-01 NOTE — Telephone Encounter (Signed)
Called to schedule f/u mri, no answer, no vm. AW 

## 2017-09-08 DIAGNOSIS — M47816 Spondylosis without myelopathy or radiculopathy, lumbar region: Secondary | ICD-10-CM | POA: Diagnosis not present

## 2017-09-08 DIAGNOSIS — G894 Chronic pain syndrome: Secondary | ICD-10-CM | POA: Diagnosis not present

## 2017-09-08 DIAGNOSIS — M545 Low back pain: Secondary | ICD-10-CM | POA: Diagnosis not present

## 2017-09-08 DIAGNOSIS — Z79899 Other long term (current) drug therapy: Secondary | ICD-10-CM | POA: Diagnosis not present

## 2017-09-14 DIAGNOSIS — R300 Dysuria: Secondary | ICD-10-CM | POA: Diagnosis not present

## 2017-09-14 DIAGNOSIS — R42 Dizziness and giddiness: Secondary | ICD-10-CM | POA: Diagnosis not present

## 2017-09-14 DIAGNOSIS — L989 Disorder of the skin and subcutaneous tissue, unspecified: Secondary | ICD-10-CM | POA: Diagnosis not present

## 2017-09-14 DIAGNOSIS — N390 Urinary tract infection, site not specified: Secondary | ICD-10-CM | POA: Diagnosis not present

## 2017-09-16 ENCOUNTER — Other Ambulatory Visit: Payer: Self-pay | Admitting: Physician Assistant

## 2017-09-16 DIAGNOSIS — L989 Disorder of the skin and subcutaneous tissue, unspecified: Secondary | ICD-10-CM

## 2017-09-22 ENCOUNTER — Ambulatory Visit
Admission: RE | Admit: 2017-09-22 | Discharge: 2017-09-22 | Disposition: A | Discharge status: Home / Self Care | Payer: Medicare Other | Source: Ambulatory Visit | Attending: Physician Assistant | Admitting: Physician Assistant

## 2017-09-22 DIAGNOSIS — R222 Localized swelling, mass and lump, trunk: Secondary | ICD-10-CM | POA: Diagnosis not present

## 2017-09-22 DIAGNOSIS — L989 Disorder of the skin and subcutaneous tissue, unspecified: Secondary | ICD-10-CM

## 2017-09-29 ENCOUNTER — Other Ambulatory Visit: Payer: Self-pay | Admitting: Family Medicine

## 2017-09-29 ENCOUNTER — Ambulatory Visit
Admission: RE | Admit: 2017-09-29 | Discharge: 2017-09-29 | Disposition: A | Discharge status: Home / Self Care | Payer: Medicare Other | Source: Ambulatory Visit | Attending: Family Medicine | Admitting: Family Medicine

## 2017-09-29 DIAGNOSIS — R222 Localized swelling, mass and lump, trunk: Secondary | ICD-10-CM

## 2017-09-29 DIAGNOSIS — J439 Emphysema, unspecified: Secondary | ICD-10-CM | POA: Diagnosis not present

## 2017-09-29 MED ORDER — IOPAMIDOL (ISOVUE-300) INJECTION 61%
75.0000 mL | Freq: Once | INTRAVENOUS | Status: AC | PRN
  Administered 2017-09-29: 75 mL via INTRAVENOUS

## 2017-10-10 DIAGNOSIS — M545 Low back pain: Secondary | ICD-10-CM | POA: Diagnosis not present

## 2017-10-10 DIAGNOSIS — G894 Chronic pain syndrome: Secondary | ICD-10-CM | POA: Diagnosis not present

## 2017-10-10 DIAGNOSIS — M47816 Spondylosis without myelopathy or radiculopathy, lumbar region: Secondary | ICD-10-CM | POA: Diagnosis not present

## 2017-10-13 ENCOUNTER — Other Ambulatory Visit (HOSPITAL_COMMUNITY): Payer: Self-pay | Admitting: Interventional Radiology

## 2017-10-13 DIAGNOSIS — I729 Aneurysm of unspecified site: Secondary | ICD-10-CM

## 2017-10-20 ENCOUNTER — Ambulatory Visit (HOSPITAL_COMMUNITY): Payer: Medicare Other

## 2017-10-20 ENCOUNTER — Ambulatory Visit (HOSPITAL_COMMUNITY)
Admission: RE | Admit: 2017-10-20 | Discharge: 2017-10-20 | Disposition: A | Discharge status: Home / Self Care | Payer: Medicare Other | Source: Ambulatory Visit | Attending: Interventional Radiology | Admitting: Interventional Radiology

## 2017-10-20 DIAGNOSIS — I729 Aneurysm of unspecified site: Secondary | ICD-10-CM

## 2017-10-20 DIAGNOSIS — Z9889 Other specified postprocedural states: Secondary | ICD-10-CM | POA: Insufficient documentation

## 2017-10-20 DIAGNOSIS — I671 Cerebral aneurysm, nonruptured: Secondary | ICD-10-CM | POA: Diagnosis not present

## 2017-10-20 LAB — CREATININE, SERUM
CREATININE: 0.55 mg/dL (ref 0.44–1.00)
GFR calc Af Amer: >60 mL/min (ref >60)

## 2017-10-20 MED ORDER — GADOBENATE DIMEGLUMINE 529 MG/ML IV SOLN
15.0000 mL | Freq: Once | INTRAVENOUS | Status: AC | PRN
  Administered 2017-10-20: 15 mL via INTRAVENOUS

## 2017-11-03 DIAGNOSIS — M797 Fibromyalgia: Secondary | ICD-10-CM | POA: Diagnosis not present

## 2017-11-03 DIAGNOSIS — K219 Gastro-esophageal reflux disease without esophagitis: Secondary | ICD-10-CM | POA: Diagnosis not present

## 2017-11-03 DIAGNOSIS — N3281 Overactive bladder: Secondary | ICD-10-CM | POA: Diagnosis not present

## 2017-11-03 DIAGNOSIS — E1065 Type 1 diabetes mellitus with hyperglycemia: Secondary | ICD-10-CM | POA: Diagnosis not present

## 2017-11-03 DIAGNOSIS — E7800 Pure hypercholesterolemia, unspecified: Secondary | ICD-10-CM | POA: Diagnosis not present

## 2017-11-03 DIAGNOSIS — K5909 Other constipation: Secondary | ICD-10-CM | POA: Diagnosis not present

## 2017-11-03 DIAGNOSIS — F330 Major depressive disorder, recurrent, mild: Secondary | ICD-10-CM | POA: Diagnosis not present

## 2017-11-03 DIAGNOSIS — F0781 Postconcussional syndrome: Secondary | ICD-10-CM | POA: Diagnosis not present

## 2017-11-03 DIAGNOSIS — I2510 Atherosclerotic heart disease of native coronary artery without angina pectoris: Secondary | ICD-10-CM | POA: Diagnosis not present

## 2017-11-03 DIAGNOSIS — I10 Essential (primary) hypertension: Secondary | ICD-10-CM | POA: Diagnosis not present

## 2017-11-03 DIAGNOSIS — L719 Rosacea, unspecified: Secondary | ICD-10-CM | POA: Diagnosis not present

## 2017-11-03 DIAGNOSIS — F411 Generalized anxiety disorder: Secondary | ICD-10-CM | POA: Diagnosis not present

## 2017-11-07 ENCOUNTER — Telehealth (HOSPITAL_COMMUNITY): Payer: Self-pay

## 2017-11-07 DIAGNOSIS — Z79891 Long term (current) use of opiate analgesic: Secondary | ICD-10-CM | POA: Diagnosis not present

## 2017-11-07 DIAGNOSIS — Z79899 Other long term (current) drug therapy: Secondary | ICD-10-CM | POA: Diagnosis not present

## 2017-11-07 DIAGNOSIS — M47816 Spondylosis without myelopathy or radiculopathy, lumbar region: Secondary | ICD-10-CM | POA: Diagnosis not present

## 2017-11-07 DIAGNOSIS — G894 Chronic pain syndrome: Secondary | ICD-10-CM | POA: Diagnosis not present

## 2017-11-07 NOTE — Telephone Encounter (Signed)
Called pt regarding recent mri, no answer. AW

## 2017-12-02 DIAGNOSIS — G894 Chronic pain syndrome: Secondary | ICD-10-CM | POA: Diagnosis not present

## 2017-12-02 DIAGNOSIS — M545 Low back pain: Secondary | ICD-10-CM | POA: Diagnosis not present

## 2017-12-02 DIAGNOSIS — M47816 Spondylosis without myelopathy or radiculopathy, lumbar region: Secondary | ICD-10-CM | POA: Diagnosis not present

## 2017-12-06 ENCOUNTER — Other Ambulatory Visit: Payer: Self-pay | Admitting: Pain Medicine

## 2017-12-06 DIAGNOSIS — G8929 Other chronic pain: Secondary | ICD-10-CM

## 2017-12-06 DIAGNOSIS — M545 Low back pain: Principal | ICD-10-CM

## 2017-12-29 DIAGNOSIS — M47816 Spondylosis without myelopathy or radiculopathy, lumbar region: Secondary | ICD-10-CM | POA: Diagnosis not present

## 2017-12-29 DIAGNOSIS — G894 Chronic pain syndrome: Secondary | ICD-10-CM | POA: Diagnosis not present

## 2018-02-03 DIAGNOSIS — E1065 Type 1 diabetes mellitus with hyperglycemia: Secondary | ICD-10-CM | POA: Diagnosis not present

## 2018-02-03 DIAGNOSIS — Z79891 Long term (current) use of opiate analgesic: Secondary | ICD-10-CM | POA: Diagnosis not present

## 2018-02-03 DIAGNOSIS — E1042 Type 1 diabetes mellitus with diabetic polyneuropathy: Secondary | ICD-10-CM | POA: Diagnosis not present

## 2018-02-03 DIAGNOSIS — F330 Major depressive disorder, recurrent, mild: Secondary | ICD-10-CM | POA: Diagnosis not present

## 2018-02-03 DIAGNOSIS — L719 Rosacea, unspecified: Secondary | ICD-10-CM | POA: Diagnosis not present

## 2018-02-03 DIAGNOSIS — M47816 Spondylosis without myelopathy or radiculopathy, lumbar region: Secondary | ICD-10-CM | POA: Diagnosis not present

## 2018-02-03 DIAGNOSIS — M545 Low back pain: Secondary | ICD-10-CM | POA: Diagnosis not present

## 2018-02-03 DIAGNOSIS — F5101 Primary insomnia: Secondary | ICD-10-CM | POA: Diagnosis not present

## 2018-02-03 DIAGNOSIS — L308 Other specified dermatitis: Secondary | ICD-10-CM | POA: Diagnosis not present

## 2018-02-03 DIAGNOSIS — N3281 Overactive bladder: Secondary | ICD-10-CM | POA: Diagnosis not present

## 2018-02-03 DIAGNOSIS — R635 Abnormal weight gain: Secondary | ICD-10-CM | POA: Diagnosis not present

## 2018-02-03 DIAGNOSIS — E7800 Pure hypercholesterolemia, unspecified: Secondary | ICD-10-CM | POA: Diagnosis not present

## 2018-02-03 DIAGNOSIS — I10 Essential (primary) hypertension: Secondary | ICD-10-CM | POA: Diagnosis not present

## 2018-02-03 DIAGNOSIS — Z79899 Other long term (current) drug therapy: Secondary | ICD-10-CM | POA: Diagnosis not present

## 2018-02-03 DIAGNOSIS — G894 Chronic pain syndrome: Secondary | ICD-10-CM | POA: Diagnosis not present

## 2018-02-03 DIAGNOSIS — F17200 Nicotine dependence, unspecified, uncomplicated: Secondary | ICD-10-CM | POA: Diagnosis not present

## 2018-02-03 DIAGNOSIS — F419 Anxiety disorder, unspecified: Secondary | ICD-10-CM | POA: Diagnosis not present

## 2018-02-09 ENCOUNTER — Ambulatory Visit
Admission: RE | Admit: 2018-02-09 | Discharge: 2018-02-09 | Disposition: A | Discharge status: Home / Self Care | Payer: Medicare Other | Source: Ambulatory Visit | Attending: Pain Medicine | Admitting: Pain Medicine

## 2018-02-09 DIAGNOSIS — G8929 Other chronic pain: Secondary | ICD-10-CM

## 2018-02-09 DIAGNOSIS — M4726 Other spondylosis with radiculopathy, lumbar region: Secondary | ICD-10-CM | POA: Diagnosis not present

## 2018-02-09 DIAGNOSIS — M545 Low back pain: Principal | ICD-10-CM

## 2018-02-09 DIAGNOSIS — M48061 Spinal stenosis, lumbar region without neurogenic claudication: Secondary | ICD-10-CM | POA: Diagnosis not present

## 2018-02-09 MED ORDER — GADOBENATE DIMEGLUMINE 529 MG/ML IV SOLN
13.0000 mL | Freq: Once | INTRAVENOUS | Status: AC | PRN
  Administered 2018-02-09: 13 mL via INTRAVENOUS

## 2018-03-02 DIAGNOSIS — M47816 Spondylosis without myelopathy or radiculopathy, lumbar region: Secondary | ICD-10-CM | POA: Diagnosis not present

## 2018-03-02 DIAGNOSIS — G894 Chronic pain syndrome: Secondary | ICD-10-CM | POA: Diagnosis not present

## 2018-03-29 DIAGNOSIS — Z79891 Long term (current) use of opiate analgesic: Secondary | ICD-10-CM | POA: Diagnosis not present

## 2018-03-29 DIAGNOSIS — G894 Chronic pain syndrome: Secondary | ICD-10-CM | POA: Diagnosis not present

## 2018-03-29 DIAGNOSIS — M47816 Spondylosis without myelopathy or radiculopathy, lumbar region: Secondary | ICD-10-CM | POA: Diagnosis not present

## 2018-03-29 DIAGNOSIS — Z79899 Other long term (current) drug therapy: Secondary | ICD-10-CM | POA: Diagnosis not present

## 2018-03-29 DIAGNOSIS — M47817 Spondylosis without myelopathy or radiculopathy, lumbosacral region: Secondary | ICD-10-CM | POA: Diagnosis not present

## 2018-03-30 DIAGNOSIS — L700 Acne vulgaris: Secondary | ICD-10-CM | POA: Diagnosis not present

## 2018-03-30 IMAGING — DX DG WRIST COMPLETE 3+V*R*
4 series · 4 of 4 positions shown · non-contrast
Comparison: None.

CLINICAL DATA: Right wrist pain, laceration along the ulnar aspect

EXAM:
RIGHT WRIST - COMPLETE 3+ VIEW

[wrist pa]
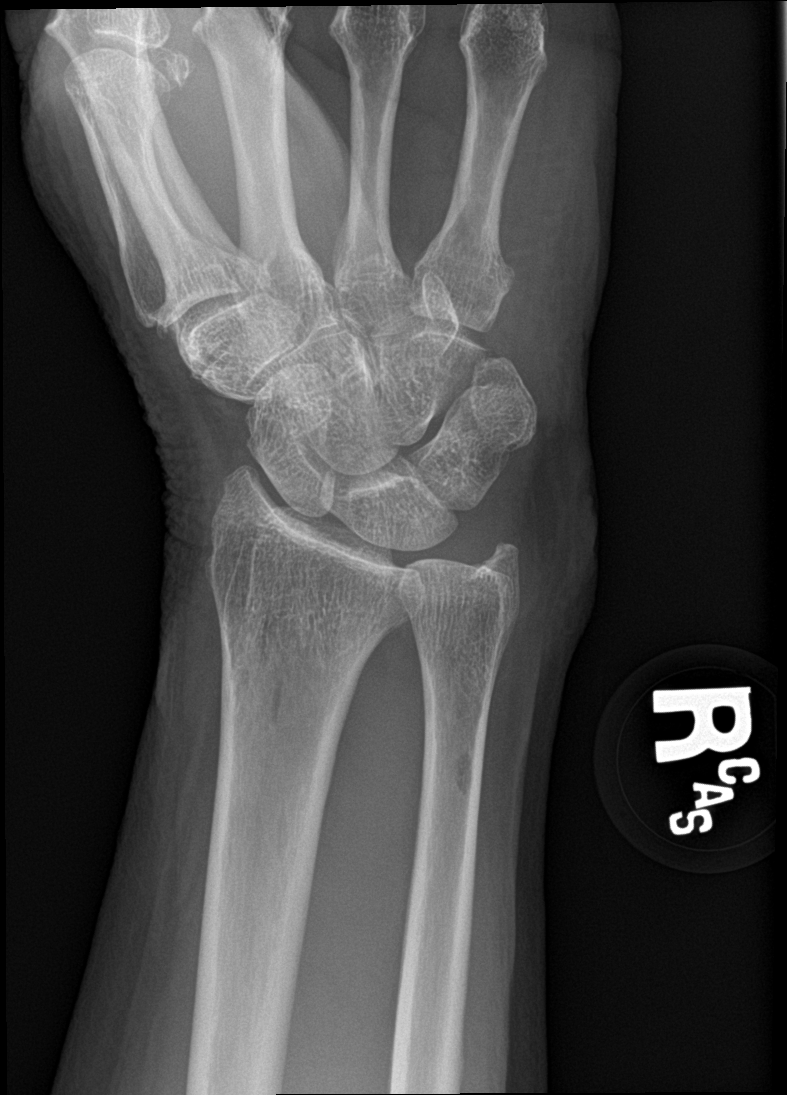

[wrist obl]
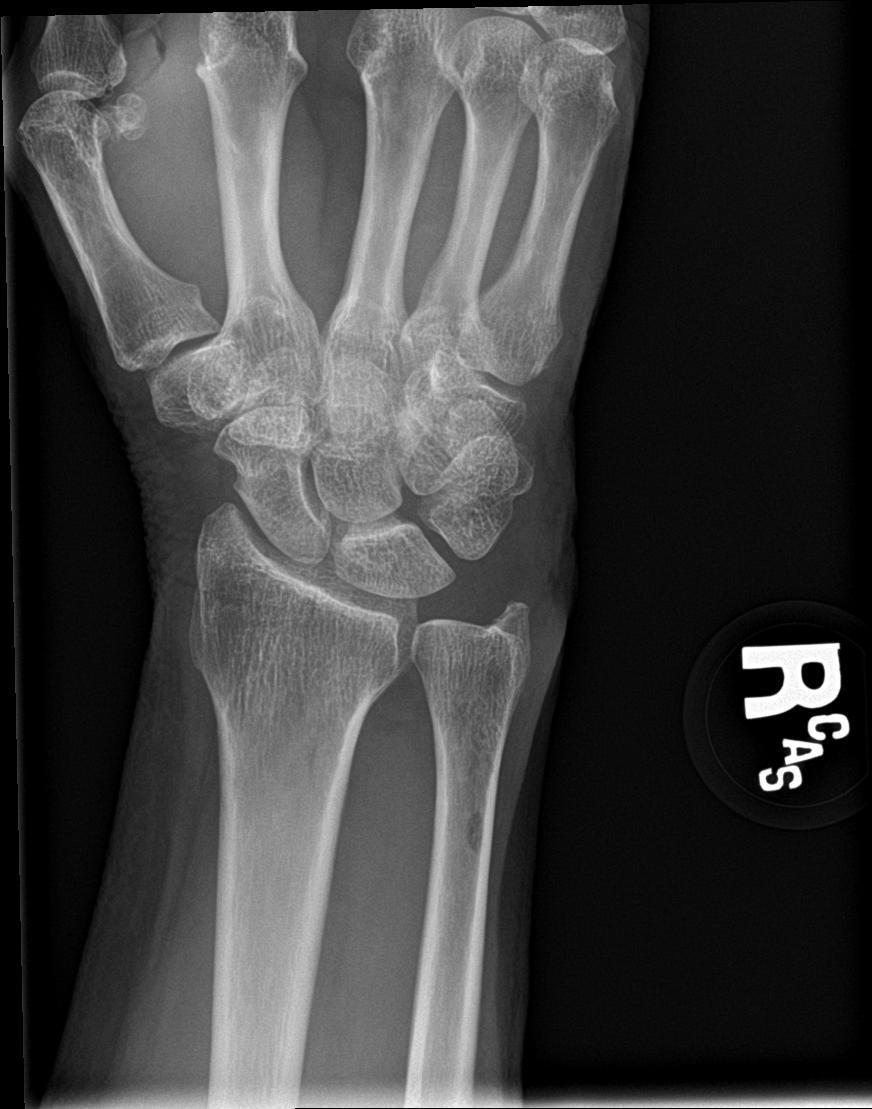

[wrist lat]
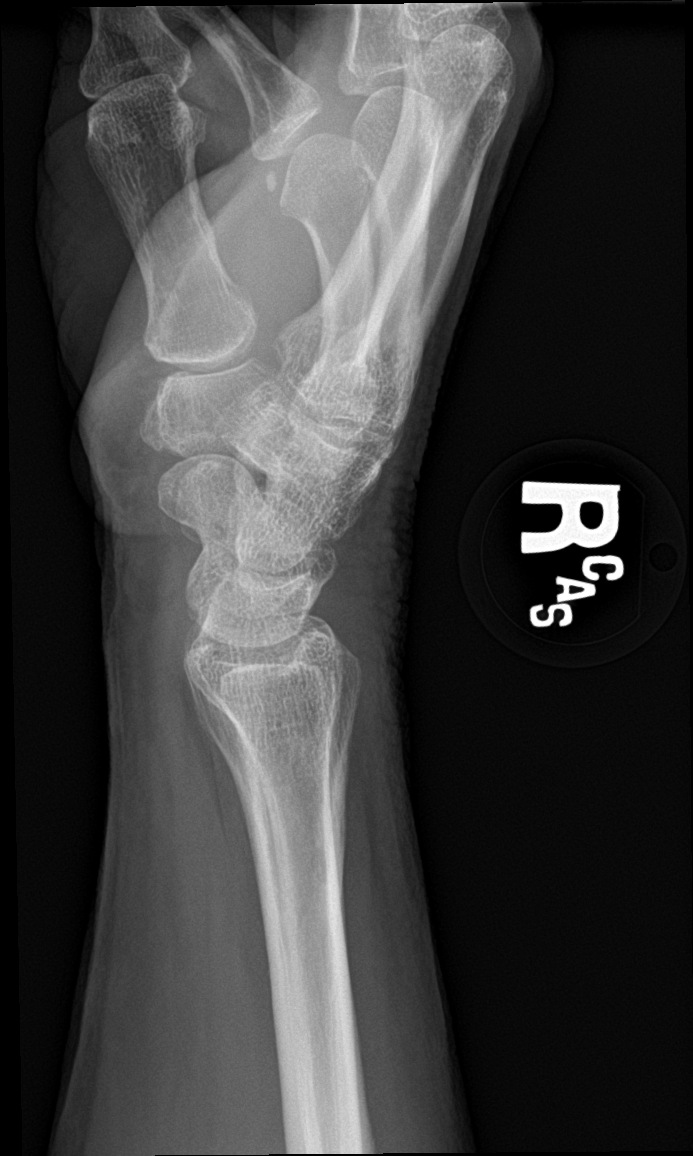

[wrist navicular]
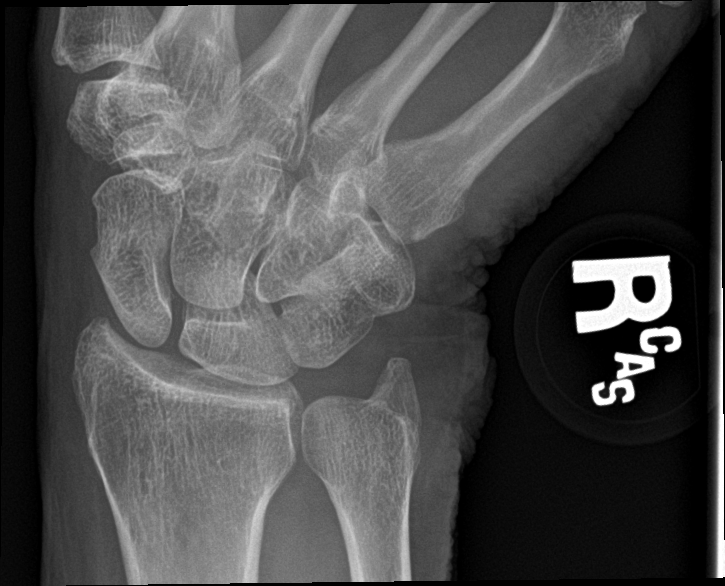

[4 of 4 positions shown; findings below may reference images not displayed]

FINDINGS: There is no evidence of fracture or dislocation. There is mild
osteoarthritis of the first CMC joint. Soft tissue laceration along
the ulnar aspect of the wrist.
IMPRESSION: No acute osseous injury of the right wrist.

Soft tissue laceration along the ulnar aspect of the wrist.

## 2018-04-11 ENCOUNTER — Other Ambulatory Visit (HOSPITAL_COMMUNITY): Payer: Self-pay | Admitting: Interventional Radiology

## 2018-04-11 DIAGNOSIS — I729 Aneurysm of unspecified site: Secondary | ICD-10-CM

## 2018-04-26 DIAGNOSIS — M47817 Spondylosis without myelopathy or radiculopathy, lumbosacral region: Secondary | ICD-10-CM | POA: Diagnosis not present

## 2018-04-26 DIAGNOSIS — G894 Chronic pain syndrome: Secondary | ICD-10-CM | POA: Diagnosis not present

## 2018-04-26 DIAGNOSIS — M79605 Pain in left leg: Secondary | ICD-10-CM | POA: Diagnosis not present

## 2018-05-01 DIAGNOSIS — L700 Acne vulgaris: Secondary | ICD-10-CM | POA: Diagnosis not present

## 2018-05-08 ENCOUNTER — Other Ambulatory Visit: Payer: Self-pay | Admitting: Radiology

## 2018-05-09 ENCOUNTER — Ambulatory Visit (HOSPITAL_COMMUNITY)
Admission: RE | Admit: 2018-05-09 | Discharge: 2018-05-09 | Disposition: A | Discharge status: Home / Self Care | Payer: Medicare Other | Source: Ambulatory Visit | Attending: Interventional Radiology | Admitting: Interventional Radiology

## 2018-05-09 ENCOUNTER — Encounter (HOSPITAL_COMMUNITY): Payer: Self-pay

## 2018-05-09 ENCOUNTER — Other Ambulatory Visit (HOSPITAL_COMMUNITY): Payer: Self-pay | Admitting: Interventional Radiology

## 2018-05-09 DIAGNOSIS — Z90710 Acquired absence of both cervix and uterus: Secondary | ICD-10-CM | POA: Diagnosis not present

## 2018-05-09 DIAGNOSIS — Z981 Arthrodesis status: Secondary | ICD-10-CM | POA: Diagnosis not present

## 2018-05-09 DIAGNOSIS — I671 Cerebral aneurysm, nonruptured: Secondary | ICD-10-CM | POA: Insufficient documentation

## 2018-05-09 DIAGNOSIS — Z9049 Acquired absence of other specified parts of digestive tract: Secondary | ICD-10-CM | POA: Diagnosis not present

## 2018-05-09 DIAGNOSIS — M1990 Unspecified osteoarthritis, unspecified site: Secondary | ICD-10-CM | POA: Insufficient documentation

## 2018-05-09 DIAGNOSIS — E785 Hyperlipidemia, unspecified: Secondary | ICD-10-CM | POA: Insufficient documentation

## 2018-05-09 DIAGNOSIS — F17210 Nicotine dependence, cigarettes, uncomplicated: Secondary | ICD-10-CM | POA: Insufficient documentation

## 2018-05-09 DIAGNOSIS — Z8673 Personal history of transient ischemic attack (TIA), and cerebral infarction without residual deficits: Secondary | ICD-10-CM | POA: Insufficient documentation

## 2018-05-09 DIAGNOSIS — Z794 Long term (current) use of insulin: Secondary | ICD-10-CM | POA: Diagnosis not present

## 2018-05-09 DIAGNOSIS — I10 Essential (primary) hypertension: Secondary | ICD-10-CM | POA: Diagnosis not present

## 2018-05-09 DIAGNOSIS — Z8719 Personal history of other diseases of the digestive system: Secondary | ICD-10-CM | POA: Insufficient documentation

## 2018-05-09 DIAGNOSIS — Z888 Allergy status to other drugs, medicaments and biological substances status: Secondary | ICD-10-CM | POA: Diagnosis not present

## 2018-05-09 DIAGNOSIS — Z8249 Family history of ischemic heart disease and other diseases of the circulatory system: Secondary | ICD-10-CM | POA: Insufficient documentation

## 2018-05-09 DIAGNOSIS — F419 Anxiety disorder, unspecified: Secondary | ICD-10-CM | POA: Insufficient documentation

## 2018-05-09 DIAGNOSIS — Z9889 Other specified postprocedural states: Secondary | ICD-10-CM | POA: Insufficient documentation

## 2018-05-09 DIAGNOSIS — F329 Major depressive disorder, single episode, unspecified: Secondary | ICD-10-CM | POA: Diagnosis not present

## 2018-05-09 DIAGNOSIS — Z955 Presence of coronary angioplasty implant and graft: Secondary | ICD-10-CM | POA: Diagnosis not present

## 2018-05-09 DIAGNOSIS — I729 Aneurysm of unspecified site: Secondary | ICD-10-CM | POA: Insufficient documentation

## 2018-05-09 DIAGNOSIS — M797 Fibromyalgia: Secondary | ICD-10-CM | POA: Diagnosis not present

## 2018-05-09 DIAGNOSIS — Z91040 Latex allergy status: Secondary | ICD-10-CM | POA: Diagnosis not present

## 2018-05-09 DIAGNOSIS — R51 Headache: Secondary | ICD-10-CM | POA: Diagnosis not present

## 2018-05-09 DIAGNOSIS — E119 Type 2 diabetes mellitus without complications: Secondary | ICD-10-CM | POA: Insufficient documentation

## 2018-05-09 DIAGNOSIS — Z79899 Other long term (current) drug therapy: Secondary | ICD-10-CM | POA: Diagnosis not present

## 2018-05-09 DIAGNOSIS — K219 Gastro-esophageal reflux disease without esophagitis: Secondary | ICD-10-CM | POA: Insufficient documentation

## 2018-05-09 HISTORY — PX: IR ANGIO VERTEBRAL SEL VERTEBRAL BILAT MOD SED: IMG5369

## 2018-05-09 HISTORY — PX: IR ANGIO INTRA EXTRACRAN SEL COM CAROTID INNOMINATE BILAT MOD SED: IMG5360

## 2018-05-09 LAB — CBC
HCT: 37.7 % (ref 36.0–46.0)
Hemoglobin: 12.6 g/dL (ref 12.0–15.0)
MCH: 27.9 pg (ref 26.0–34.0)
MCHC: 33.4 g/dL (ref 30.0–36.0)
MCV: 83.4 fL (ref 80.0–100.0)
NRBC: 0 % (ref 0.0–0.2)
Platelets: 174 10*3/uL (ref 150–400)
RBC: 4.52 MIL/uL (ref 3.87–5.11)
RDW: 12.3 % (ref 11.5–15.5)
WBC: 8.2 10*3/uL (ref 4.0–10.5)

## 2018-05-09 LAB — PROTIME-INR
INR: 1.06
PROTHROMBIN TIME: 13.7 s (ref 11.4–15.2)

## 2018-05-09 LAB — GLUCOSE, CAPILLARY
Glucose-Capillary: 163 mg/dL — ABNORMAL HIGH (ref 70–99)
Glucose-Capillary: 233 mg/dL — ABNORMAL HIGH (ref 70–99)

## 2018-05-09 LAB — BASIC METABOLIC PANEL
ANION GAP: 7 (ref 5–15)
BUN: 14 mg/dL (ref 8–23)
CALCIUM: 8.9 mg/dL (ref 8.9–10.3)
CO2: 24 mmol/L (ref 22–32)
Chloride: 104 mmol/L (ref 98–111)
Creatinine, Ser: 0.58 mg/dL (ref 0.44–1.00)
GFR calc non Af Amer: >60 mL/min (ref >60)
GLUCOSE: 166 mg/dL — AB (ref 70–99)
Potassium: 3.7 mmol/L (ref 3.5–5.1)
Sodium: 135 mmol/L (ref 135–145)

## 2018-05-09 MED ORDER — HYDRALAZINE HCL 20 MG/ML IJ SOLN
INTRAMUSCULAR | Status: AC | PRN
  Administered 2018-05-09 (×3): 5 mg via INTRAVENOUS

## 2018-05-09 MED ORDER — HEPARIN SODIUM (PORCINE) 1000 UNIT/ML IJ SOLN
INTRAMUSCULAR | Status: AC | PRN
  Administered 2018-05-09: 1000 [IU] via INTRAVENOUS

## 2018-05-09 MED ORDER — LIDOCAINE HCL 1 % IJ SOLN
INTRAMUSCULAR | Status: AC
  Filled 2018-05-09: qty 20

## 2018-05-09 MED ORDER — SODIUM CHLORIDE 0.9 % IV SOLN
Freq: Once | INTRAVENOUS | Status: AC
  Administered 2018-05-09: 07:00:00 via INTRAVENOUS

## 2018-05-09 MED ORDER — NITROGLYCERIN 1 MG/10 ML FOR IR/CATH LAB
INTRA_ARTERIAL | Status: AC
  Filled 2018-05-09: qty 10

## 2018-05-09 MED ORDER — ONDANSETRON HCL 4 MG/2ML IJ SOLN
INTRAMUSCULAR | Status: AC
  Filled 2018-05-09: qty 2

## 2018-05-09 MED ORDER — ONDANSETRON HCL 4 MG/2ML IJ SOLN
4.0000 mg | Freq: Once | INTRAMUSCULAR | Status: AC
  Administered 2018-05-09: 4 mg via INTRAVENOUS

## 2018-05-09 MED ORDER — HYDRALAZINE HCL 20 MG/ML IJ SOLN: 5.0000 mg | Freq: Once | INTRAMUSCULAR | Status: DC

## 2018-05-09 MED ORDER — SODIUM CHLORIDE 0.9 % IV SOLN
INTRAVENOUS | Status: AC | PRN
  Administered 2018-05-09: 10 mL/h via INTRAVENOUS

## 2018-05-09 MED ORDER — HEPARIN SODIUM (PORCINE) 1000 UNIT/ML IJ SOLN
INTRAMUSCULAR | Status: AC
  Filled 2018-05-09: qty 1

## 2018-05-09 MED ORDER — FENTANYL CITRATE (PF) 100 MCG/2ML IJ SOLN
INTRAMUSCULAR | Status: AC
  Filled 2018-05-09: qty 4

## 2018-05-09 MED ORDER — OXYCODONE-ACETAMINOPHEN 5-325 MG PO TABS
2.0000 | ORAL_TABLET | Freq: Every day | ORAL | Status: DC | PRN
  Administered 2018-05-09: 1 via ORAL
  Filled 2018-05-09 (×2): qty 2

## 2018-05-09 MED ORDER — HYDRALAZINE HCL 20 MG/ML IJ SOLN
INTRAMUSCULAR | Status: AC
  Filled 2018-05-09: qty 1

## 2018-05-09 MED ORDER — MIDAZOLAM HCL 2 MG/2ML IJ SOLN
INTRAMUSCULAR | Status: AC
  Filled 2018-05-09: qty 4

## 2018-05-09 MED ORDER — FENTANYL CITRATE (PF) 100 MCG/2ML IJ SOLN
INTRAMUSCULAR | Status: AC | PRN
  Administered 2018-05-09 (×4): 25 ug via INTRAVENOUS

## 2018-05-09 MED ORDER — LIDOCAINE HCL (PF) 1 % IJ SOLN
INTRAMUSCULAR | Status: AC | PRN
  Administered 2018-05-09: 20 mL

## 2018-05-09 MED ORDER — VERAPAMIL HCL 2.5 MG/ML IV SOLN
INTRAVENOUS | Status: AC
  Filled 2018-05-09: qty 2

## 2018-05-09 MED ORDER — METOPROLOL SUCCINATE ER 25 MG PO TB24
25.0000 mg | ORAL_TABLET | Freq: Every day | ORAL | Status: DC
  Administered 2018-05-09: 25 mg via ORAL
  Filled 2018-05-09 (×2): qty 1

## 2018-05-09 MED ORDER — MIDAZOLAM HCL 2 MG/2ML IJ SOLN
INTRAMUSCULAR | Status: AC | PRN
  Administered 2018-05-09 (×3): 1 mg via INTRAVENOUS

## 2018-05-09 MED ORDER — SODIUM CHLORIDE 0.9 % IV SOLN: INTRAVENOUS | Status: AC

## 2018-05-09 NOTE — Discharge Instructions (Addendum)
Radial Site Care Refer to this sheet in the next few weeks. These instructions provide you with information about caring for yourself after your procedure. Your health care provider may also give you more specific instructions. Your treatment has been planned according to current medical practices, but problems sometimes occur. Call your health care provider if you have any problems or questions after your procedure. What can I expect after the procedure? After your procedure, it is typical to have the following:  Bruising at the radial site that usually fades within 1-2 weeks.  Blood collecting in the tissue (hematoma) that may be painful to the touch. It should usually decrease in size and tenderness within 1-2 weeks.  Follow these instructions at home:  Take medicines only as directed by your health care provider.  You may shower 24-48 hours after the procedure or as directed by your health care provider. Remove the bandage (dressing) and gently wash the site with plain soap and water. Pat the area dry with a clean towel. Do not rub the site, because this may cause bleeding.  Do not take baths, swim, or use a hot tub until your health care provider approves.  Check your insertion site every day for redness, swelling, or drainage.  Do not apply powder or lotion to the site.  Do not flex or bend the affected arm for 24 hours or as directed by your health care provider.  Do not push or pull heavy objects with the affected arm for 24 hours or as directed by your health care provider.  Do not lift over 10 lb (4.5 kg) for 5 days after your procedure or as directed by your health care provider.  Ask your health care provider when it is okay to: ? Return to work or school. ? Resume usual physical activities or sports. ? Resume sexual activity.  Do not drive home if you are discharged the same day as the procedure. Have someone else drive you.  You may drive 24 hours after the procedure  unless otherwise instructed by your health care provider.  Do not operate machinery or power tools for 24 hours after the procedure.  If your procedure was done as an outpatient procedure, which means that you went home the same day as your procedure, a responsible adult should be with you for the first 24 hours after you arrive home.  Keep all follow-up visits as directed by your health care provider. This is important. Contact a health care provider if:  You have a fever.  You have chills.  You have increased bleeding from the radial site. Hold pressure on the site. CALL 911 Get help right away if:  You have unusual pain at the radial site.  You have redness, warmth, or swelling at the radial site.  You have drainage (other than a small amount of blood on the dressing) from the radial site.  The radial site is bleeding, and the bleeding does not stop after 30 minutes of holding steady pressure on the site.  Your arm or hand becomes pale, cool, tingly, or numb. This information is not intended to replace advice given to you by your health care provider. Make sure you discuss any questions you have with your health care provider. Document Released: 07/24/2010 Document Revised: 11/27/2015 Document Reviewed: 01/07/2014 Elsevier Interactive Patient Education  2018 [employer] After This sheet gives you information about how to care for yourself after your procedure. Your health care provider may also  give you more specific instructions. If you have problems or questions, contact your health care provider. What can I expect after the procedure? After the procedure, it is common to have bruising and tenderness at the catheter insertion area. Follow these instructions at home: Insertion site care  Follow instructions from your health care provider about how to take care of your insertion site. Make sure you: ? Wash your hands with soap and water before you change  your bandage (dressing). If soap and water are not available, use hand sanitizer. ? Change your dressing as told by your health care provider. ? Leave stitches (sutures), skin glue, or adhesive strips in place. These skin closures may need to stay in place for 2 weeks or longer. If adhesive strip edges start to loosen and curl up, you may trim the loose edges. Do not remove adhesive strips completely unless your health care provider tells you to do that.  Do not take baths, swim, or use a hot tub until your health care provider approves.  You may shower 24-48 hours after the procedure or as told by your health care provider. ? Gently wash the site with plain soap and water. ? Pat the area dry with a clean towel. ? Do not rub the site. This may cause bleeding.  Do not apply powder or lotion to the site. Keep the site clean and dry.  Check your insertion site every day for signs of infection. Check for: ? Redness, swelling, or pain. ? Fluid or blood. ? Warmth. ? Pus or a bad smell. Activity  Rest as told by your health care provider, usually for 1-2 days.  Do not lift anything that is heavier than 10 lbs. (4.5 kg) or as told by your health care provider.  Do not drive for 24 hours if you were given a medicine to help you relax (sedative).  Do not drive or use heavy machinery while taking prescription pain medicine. General instructions  Return to your normal activities as told by your health care provider, usually in about a week. Ask your health care provider what activities are safe for you.  If the catheter site starts bleeding, lie flat and put pressure on the site. If the bleeding does not stop, get help right away. This is a medical emergency.  Drink enough fluid to keep your urine clear or pale yellow. This helps flush the contrast dye from your body.  Take over-the-counter and prescription medicines only as told by your health care provider.  Keep all follow-up visits as  told by your health care provider. This is important. Contact a health care provider if:  You have a fever or chills.  You have redness, swelling, or pain around your insertion site.  You have fluid or blood coming from your insertion site.  The insertion site feels warm to the touch.  You have pus or a bad smell coming from your insertion site.  You have bruising around the insertion site.  You notice blood collecting in the tissue around the catheter site (hematoma). The hematoma may be painful to the touch. Get help right away if:  You have severe pain at the catheter insertion area.  The catheter insertion area swells very fast.  The catheter insertion area is bleeding, and the bleeding does not stop when you hold steady pressure on the area.  The area near or just beyond the catheter insertion site becomes pale, cool, tingly, or numb. These symptoms may represent a serious  problem that is an emergency. Do not wait to see if the symptoms will go away. Get medical help right away. Call your local emergency services (911 in the [place] Do not drive yourself to the hospital. Summary  After the procedure, it is common to have bruising and tenderness at the catheter insertion area.  After the procedure, it is important to rest and drink plenty of fluids.  Do not take baths, swim, or use a hot tub until your health care provider says it is okay to do so. You may shower 24-48 hours after the procedure or as told by your health care provider.  If the catheter site starts bleeding, lie flat and put pressure on the site. If the bleeding does not stop, get help right away. This is a medical emergency. This information is not intended to replace advice given to you by your health care provider. Make sure you discuss any questions you have with your health care provider. Document Released: 01/07/2005 Document Revised: 05/26/2016 Document Reviewed: 05/26/2016 Elsevier Interactive Patient  Education  2018 [employer]. c. Moderate Conscious Sedation, Adult, Care After These instructions provide you with information about caring for yourself after your procedure. Your health care provider may also give you more specific instructions. Your treatment has been planned according to current medical practices, but problems sometimes occur. Call your health care provider if you have any problems or questions after your procedure. What can I expect after the procedure? After your procedure, it is common:  To feel sleepy for several hours.  To feel clumsy and have poor balance for several hours.  To have poor judgment for several hours.  To vomit if you eat too soon.  Follow these instructions at home: For at least 24 hours after the procedure:   Do not: ? Participate in activities where you could fall or become injured. ? Drive. ? Use heavy machinery. ? Drink alcohol. ? Take sleeping pills or medicines that cause drowsiness. ? Make important decisions or sign legal documents. ? Take care of children on your own.  Rest. Eating and drinking  Follow the diet recommended by your health care provider.  If you vomit: ? Drink water, juice, or soup when you can drink without vomiting. ? Make sure you have little or no nausea before eating solid foods. General instructions  Have a responsible adult stay with you until you are awake and alert.  Take over-the-counter and prescription medicines only as told by your health care provider.  If you smoke, do not smoke without supervision.  Keep all follow-up visits as told by your health care provider. This is important. Contact a health care provider if:  You keep feeling nauseous or you keep vomiting.  You feel light-headed.  You develop a rash.  You have a fever. Get help right away if:  You have trouble breathing. This information is not intended to replace advice given to you by your health care provider. Make  sure you discuss any questions you have with your health care provider. Document Released: 04/11/2013 Document Revised: 11/24/2015 Document Reviewed: 10/11/2015 Elsevier Interactive Patient Education  [employer].

## 2018-05-09 NOTE — Progress Notes (Signed)
Pt unable to void w bed pan or pure wick, obtained order for in/out cath.

## 2018-05-09 NOTE — Procedures (Signed)
S/P 4 vessel cerebral arteriogram RT CFA approach. Findings . Obliterated basilar apex aneurysm.. RT rad approach abandoned due to vasospasm

## 2018-05-09 NOTE — Progress Notes (Signed)
Emesis x 1, bright yellow bile fluid, no food, pt has declined food today. Radiology Brynda Greathouse paged for orders/ update.

## 2018-05-09 NOTE — Progress Notes (Signed)
Called pharmacy for metoprolol, needs verification and to be sent/ not in our med station.

## 2018-05-09 NOTE — H&P (Signed)
Chief Complaint: Patient was seen in consultation today for cerebral angiogram.  Supervising Physician: Luanne Bras  Patient Status: [facility] - Out-pt  History of Present Illness: Mallory Mendez is a 62 y.o. 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 Estanislado Pandy. Patient presents today for follow-up cerebral angiogram.  Patient states she continues to have headaches that are all over her head, not improved with OTC medications and are only relieved with "the shot they give me at the hospital." She states she experiences nausea and vomiting when these headaches become severe and this is usually when she presents to the hospital. She states she still experiences dizziness at times, worse with standing but also occurring when she is seated sometimes - she states it feels like the room is spinning around her. She denies syncope. She also reports that her vision is becoming more blurry and that she sees black spots all the time in both eyes that are "like bugs on my eyes that I can't swat away." She states she has been working on cutting back her smoking, currently smoking about 1/2 PPD. She continues to take her medications as prescribed.   Past Medical History:  Diagnosis Date  . Anxiety   . Arthritis   . Blood transfusion   . 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 Chronic constipation   . Chronic stomach ulcer   . Concussion    HAS HAD DIZZINESS/ MEMORY PROBLEMS/ SLOW SPEACH /BALANCE PROBLEMS SINCE CONCUSSION 1 1/2 YR AGO PR GOES TO THERAPY  . Concussion   . Depression   . Diabetes mellitus   . Diabetes mellitus   . Dizziness   . Fibromyalgia   . Full dentures   . GERD (gastroesophageal reflux disease)   . Headache(784.0)   . Hernia    incisional  . History of back surgery    multiple  . Hyperlipidemia   . Hypertension   . MRSA (methicillin resistant Staphylococcus aureus)   . Nocturia   . Paroxysmal vertigo   . Scar   . Sleep difficulties   . Stroke ([place])    crica 2002  . Umbilical hernia   . Wears glasses     Past Surgical History:  Procedure Laterality Date  . ABDOMINAL HYSTERECTOMY    . APPENDECTOMY    . BACK SURGERY     SPINAL FUSION X6   . CARDIAC CATHETERIZATION N/A 12/23/2015   Procedure: Left Heart Cath and Coronary Angiography;  Surgeon: Adrian Prows, MD;  Location: [facility];  Service: Cardiovascular;  Laterality: N/A;  . CEREBRAL ANEURYSM REPAIR    . CHOLECYSTECTOMY  1984  . EYE SURGERY  2010   bilateral  . HERNIA REPAIR     09/21/2010  . HERNIA REPAIR  05/19/11   incisional hernia repair   . INCISIONAL HERNIA REPAIR  05/19/2011   Procedure: HERNIA REPAIR INCISIONAL;  Surgeon: Willey Blade, MD;  Location: [facility];  Service: General;  Laterality: N/A;  repair incisional hernia with mesh  . IR GENERIC HISTORICAL  09/30/2016   IR ANGIO VERTEBRAL SEL VERTEBRAL BILAT MOD SED 09/30/2016 Luanne Bras, MD [facility]  . IR GENERIC HISTORICAL  09/30/2016   IR ANGIO INTRA EXTRACRAN SEL COM CAROTID INNOMINATE BILAT MOD SED 09/30/2016 Luanne Bras, MD [facility]  . LEFT HEART CATHETERIZATION WITH  CORONARY ANGIOGRAM N/A 11/05/2013   Procedure: LEFT HEART CATHETERIZATION WITH CORONARY ANGIOGRAM;  Surgeon: Laverda Page, MD;  Location: [facility];  Service: Cardiovascular;  Laterality: N/A;  . SPINAL FUSION  2006  . TUBAL LIGATION      Allergies: Cymbalta [duloxetine hcl] and Latex  Medications: Prior to Admission medications   Medication Sig Start Date End Date Taking? Authorizing Provider  ALPRAZolam Duanne Moron) 1 MG tablet Take 1 mg by mouth 3 (three) times daily as needed for anxiety.  10/01/13  Yes [provider]  baclofen (LIORESAL) 10 MG tablet Take 10 mg by mouth daily.  06/18/15  Yes [provider]  buPROPion (WELLBUTRIN XL) 150 MG 24 hr tablet Take 150 mg by mouth daily.   Yes [provider]  esomeprazole (NEXIUM) 40 MG capsule Take 40 mg by mouth every evening.    Yes [provider]  estradiol (ESTRACE) 1 MG tablet Take 1 mg by mouth daily.   Yes [provider]  furosemide (LASIX) 20 MG tablet Take 20 mg by mouth every other day.   Yes [provider]  gabapentin (NEURONTIN) 800 MG tablet Take 800 mg by mouth 3 (three) times daily.    Yes [provider]  insulin aspart (NOVOLOG FLEXPEN) 100 UNIT/ML FlexPen Inject 8-12 Units into the skin 3 (three) times daily with meals. Sliding scale   Yes [provider]  insulin glargine (LANTUS) 100 UNIT/ML injection Inject 28-42 Units into the skin 2 (two) times daily. 28 units in the morning, and 42 units at bedtime   Yes [provider]  isosorbide mononitrate (ISMO,MONOKET) 10 MG tablet Take 10 mg by mouth daily.   Yes [provider]  levocetirizine (XYZAL) 5 MG tablet Take 5 mg by mouth every evening.   Yes [provider]  losartan (COZAAR) 100 MG tablet Take 100 mg by mouth daily.   Yes [provider]  meclizine (ANTIVERT) 12.5 MG tablet Take 1 tablet (12.5 mg total) by mouth 3 (three) times daily as needed for dizziness. 06/23/15  Yes Lajean Saver, MD  metoprolol succinate (TOPROL-XL) 25 MG 24 hr tablet Take 25 mg by mouth daily.   Yes [provider]  nitroGLYCERIN (NITROLINGUAL) 0.4 MG/SPRAY spray Place 1 spray under the tongue every 5 (five) minutes x 3 doses as needed for chest pain. 10/31/13  Yes Nita Sells, MD  oxybutynin (DITROPAN) 5 MG tablet Take 5 mg by mouth daily.   Yes [provider]  oxyCODONE ER (XTAMPZA ER) 27 MG C12A Take 1 tablet by mouth 2 (two) times daily.   Yes [provider]  oxyCODONE-acetaminophen (PERCOCET) 10-325 MG per tablet Take 1 tablet by mouth 3 (three) times daily.     Yes [provider]  trimipramine (SURMONTIL) 100 MG capsule Take 100 mg by mouth at bedtime.   Yes [provider]     Family History  Problem Relation Age of Onset  . Breast cancer Mother   . Heart disease Father   . Allergies Daughter     Social History   Socioeconomic History  . Marital status: Married    Spouse name: Not on file  . Number of children: Not on file  . Years of education: Not on file  . Highest education level: Not on file  Occupational History  . Occupation: disabled  Social Needs  . Financial resource strain: Not on file  . Food insecurity:    Worry: Not on file  Inability: Not on file  . Transportation needs:    Medical: Not on file    Non-medical: Not on file  Tobacco Use  . Smoking status: Current Every Day Smoker    Packs/day: 0.50    Years: 38.00    Pack years: 19.00    Types: Cigarettes  . Smokeless tobacco: Never Used  Substance and Sexual Activity  . Alcohol use: No  . Drug use: No  . Sexual activity: Not on file  Lifestyle  . Physical activity:    Days per week: Not on file    Minutes per session: Not on file  . Stress: Not on file  Relationships  . Social connections:    Talks on phone: Not on file    Gets together: Not on file    Attends religious service: Not on file    Active member of club or organization: Not on file    Attends meetings of clubs or organizations: Not on file    Relationship status: Not on file  Other Topics Concern  . Not on file  Social History Narrative  . Not on file     Review of Systems: A 12 point ROS discussed and pertinent positives are indicated in the HPI above.  All other systems are negative.  Review of Systems  Constitutional: Positive for fatigue. Negative for chills and fever.  Respiratory: Negative for cough and shortness of breath.   Cardiovascular: Negative for chest pain.  Gastrointestinal: Positive for nausea (with headaches) and vomiting (with headaches).  Negative for abdominal pain.  Musculoskeletal: Positive for gait problem.  Neurological: Positive for dizziness, weakness, numbness and headaches. Negative for syncope.  Psychiatric/Behavioral: Negative for confusion.    Vital Signs: BP (!) 165/81   Pulse 62   Temp 98.2 F (36.8 C) (Oral)   Resp 16   Ht 5\' 3"  (1.6 m)   Wt 145 lb (65.8 kg)   SpO2 99%   BMI 25.69 kg/m   Physical Exam  Constitutional: She is oriented to person, place, and time. No distress.  HENT:  Head: Normocephalic.  Cardiovascular: Normal rate, regular rhythm and normal heart sounds.  Pulmonary/Chest: Effort normal and breath sounds normal.  Abdominal: Soft. She exhibits no distension. There is no tenderness.  Neurological: She is alert and oriented to person, place, and time.  Skin: Skin is warm and dry. She is not diaphoretic.  Psychiatric: She has a normal mood and affect. Her behavior is normal. Judgment and thought content normal.  Vitals reviewed.    Imaging: No results found.  Labs:  CBC: Recent Labs    05/09/18 0644  WBC 8.2  HGB 12.6  HCT 37.7  PLT 174    COAGS: Recent Labs    05/09/18 0644  INR 1.06    BMP: Recent Labs    10/20/17 1511 05/09/18 0644  NA  --  135  K  --  3.7  CL  --  104  CO2  --  24  GLUCOSE  --  166*  BUN  --  14  CALCIUM  --  8.9  CREATININE 0.55 0.58  GFRNONAA >60 >60  GFRAA >60 >60    LIVER FUNCTION TESTS: No results for input(s): BILITOT, AST, ALT, ALKPHOS, PROT, ALBUMIN in the last 8760 hours.  TUMOR MARKERS: No results for input(s): AFPTM, CEA, CA199, CHROMGRNA in the last 8760 hours.  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 Estanislado Pandy who presents today  for follow-up angiogram. She continues to experience paroxysmal vertigo, headaches with nausea/vomiting, gait/balance abnormalities for which she is receiving therapy and blurry vision with black spots. She states understanding  of indication for procedure today and wishes to proceed.  Risks and benefits of cerebral angiogram were discussed with the patient including, but not limited to bleeding, infection, vascular injury or contrast induced renal failure.  This interventional procedure involves the use of X-rays and because of the nature of the planned procedure, it is possible that we will have prolonged use of X-ray fluoroscopy. Potential radiation risks to you include (but are not limited to) the following: - A slightly elevated risk for cancer  several years later in life. This risk is typically less than 0.5% percent. This risk is low in comparison to the normal incidence of human cancer, which is 33% for women and 50% for men according to the [place]. - Radiation induced injury can include skin redness, resembling a rash, tissue breakdown / ulcers and hair loss (which can be temporary or permanent).  The likelihood of either of these occurring depends on the difficulty of the procedure and whether you are sensitive to radiation due to previous procedures, disease, or genetic conditions.  IF your procedure requires a prolonged use of radiation, you will be notified and given written instructions for further action.  It is your responsibility to monitor the irradiated area for the 2 weeks following the procedure and to notify your physician if you are concerned that you have suffered a radiation induced injury.    All of the patient's questions were answered, patient is agreeable to proceed.  Consent signed and in chart.  Thank you for this interesting consult.  I greatly enjoyed meeting JAQUEL COOMER and look forward to participating in their care.  A copy of this report was sent to the requesting provider on this date.  Electronically Signed: Joaquim Nam, PA-C 05/09/2018, 8:15 AM   I spent a total of  15 Minutes in face to face in clinical consultation, greater than 50% of which was  counseling/coordinating care for cerebral angiogram.

## 2018-05-09 NOTE — Progress Notes (Addendum)
Patient with episodes of nausea today, vomiting x1.  Poor appetite. Soft bowel movement this afternoon.   Patient assessed by Dr. Estanislado Pandy.  No changes in neuro status.  Her groin site is intact. No concerns from procedure today.  Question whether patient with post-procedural nausea vs. Viral infection.  Stable for discharge home.  Phenergan 12.5 mg tablets called into [facility].  Patient instructed by Dr. Estanislado Pandy to follow-up with her PCP tomorrow if symptoms not improved overnight.   Brynda Greathouse, MS RD PA-C

## 2018-05-10 ENCOUNTER — Encounter (HOSPITAL_COMMUNITY): Payer: Self-pay | Admitting: Interventional Radiology

## 2018-05-24 DIAGNOSIS — M961 Postlaminectomy syndrome, not elsewhere classified: Secondary | ICD-10-CM | POA: Diagnosis not present

## 2018-05-24 DIAGNOSIS — G894 Chronic pain syndrome: Secondary | ICD-10-CM | POA: Diagnosis not present

## 2018-05-24 DIAGNOSIS — M47817 Spondylosis without myelopathy or radiculopathy, lumbosacral region: Secondary | ICD-10-CM | POA: Diagnosis not present

## 2018-05-24 DIAGNOSIS — M79605 Pain in left leg: Secondary | ICD-10-CM | POA: Diagnosis not present

## 2018-06-19 DIAGNOSIS — E109 Type 1 diabetes mellitus without complications: Secondary | ICD-10-CM | POA: Diagnosis not present

## 2018-06-19 DIAGNOSIS — I10 Essential (primary) hypertension: Secondary | ICD-10-CM | POA: Diagnosis not present

## 2018-06-19 DIAGNOSIS — E785 Hyperlipidemia, unspecified: Secondary | ICD-10-CM | POA: Diagnosis not present

## 2018-06-19 DIAGNOSIS — Z23 Encounter for immunization: Secondary | ICD-10-CM | POA: Diagnosis not present

## 2018-10-17 DIAGNOSIS — F330 Major depressive disorder, recurrent, mild: Secondary | ICD-10-CM | POA: Diagnosis not present

## 2018-10-17 DIAGNOSIS — G894 Chronic pain syndrome: Secondary | ICD-10-CM | POA: Diagnosis not present

## 2018-10-17 DIAGNOSIS — F5101 Primary insomnia: Secondary | ICD-10-CM | POA: Diagnosis not present

## 2018-10-17 DIAGNOSIS — E7800 Pure hypercholesterolemia, unspecified: Secondary | ICD-10-CM | POA: Diagnosis not present

## 2018-10-17 DIAGNOSIS — L719 Rosacea, unspecified: Secondary | ICD-10-CM | POA: Diagnosis not present

## 2018-10-17 DIAGNOSIS — F411 Generalized anxiety disorder: Secondary | ICD-10-CM | POA: Diagnosis not present

## 2018-10-17 DIAGNOSIS — E1065 Type 1 diabetes mellitus with hyperglycemia: Secondary | ICD-10-CM | POA: Diagnosis not present

## 2018-10-17 DIAGNOSIS — I10 Essential (primary) hypertension: Secondary | ICD-10-CM | POA: Diagnosis not present

## 2018-10-17 DIAGNOSIS — K5909 Other constipation: Secondary | ICD-10-CM | POA: Diagnosis not present

## 2018-11-03 ENCOUNTER — Encounter: Payer: Self-pay | Admitting: Endocrinology

## 2018-11-03 DIAGNOSIS — I10 Essential (primary) hypertension: Secondary | ICD-10-CM | POA: Diagnosis not present

## 2018-11-03 DIAGNOSIS — E7800 Pure hypercholesterolemia, unspecified: Secondary | ICD-10-CM | POA: Diagnosis not present

## 2018-11-03 DIAGNOSIS — K3184 Gastroparesis: Secondary | ICD-10-CM | POA: Diagnosis not present

## 2018-11-03 DIAGNOSIS — N390 Urinary tract infection, site not specified: Secondary | ICD-10-CM | POA: Diagnosis not present

## 2018-11-03 DIAGNOSIS — E1140 Type 2 diabetes mellitus with diabetic neuropathy, unspecified: Secondary | ICD-10-CM | POA: Diagnosis not present

## 2018-11-03 DIAGNOSIS — E1065 Type 1 diabetes mellitus with hyperglycemia: Secondary | ICD-10-CM | POA: Diagnosis not present

## 2018-11-03 DIAGNOSIS — E041 Nontoxic single thyroid nodule: Secondary | ICD-10-CM | POA: Diagnosis not present

## 2018-11-17 DIAGNOSIS — L03115 Cellulitis of right lower limb: Secondary | ICD-10-CM | POA: Diagnosis not present

## 2018-11-17 DIAGNOSIS — L309 Dermatitis, unspecified: Secondary | ICD-10-CM | POA: Diagnosis not present

## 2018-12-05 DIAGNOSIS — R509 Fever, unspecified: Secondary | ICD-10-CM | POA: Diagnosis not present

## 2018-12-05 DIAGNOSIS — J028 Acute pharyngitis due to other specified organisms: Secondary | ICD-10-CM | POA: Diagnosis not present

## 2018-12-11 DIAGNOSIS — M545 Low back pain: Secondary | ICD-10-CM | POA: Diagnosis not present

## 2018-12-11 DIAGNOSIS — Z79891 Long term (current) use of opiate analgesic: Secondary | ICD-10-CM | POA: Diagnosis not present

## 2018-12-11 DIAGNOSIS — G894 Chronic pain syndrome: Secondary | ICD-10-CM | POA: Diagnosis not present

## 2018-12-11 DIAGNOSIS — M961 Postlaminectomy syndrome, not elsewhere classified: Secondary | ICD-10-CM | POA: Diagnosis not present

## 2018-12-28 DIAGNOSIS — E109 Type 1 diabetes mellitus without complications: Secondary | ICD-10-CM | POA: Diagnosis not present

## 2018-12-29 DIAGNOSIS — Z1211 Encounter for screening for malignant neoplasm of colon: Secondary | ICD-10-CM | POA: Diagnosis not present

## 2018-12-29 DIAGNOSIS — I10 Essential (primary) hypertension: Secondary | ICD-10-CM | POA: Diagnosis not present

## 2018-12-29 DIAGNOSIS — N9089 Other specified noninflammatory disorders of vulva and perineum: Secondary | ICD-10-CM | POA: Diagnosis not present

## 2018-12-29 DIAGNOSIS — N898 Other specified noninflammatory disorders of vagina: Secondary | ICD-10-CM | POA: Diagnosis not present

## 2019-01-03 ENCOUNTER — Other Ambulatory Visit: Payer: Self-pay | Admitting: Obstetrics and Gynecology

## 2019-01-03 DIAGNOSIS — Z794 Long term (current) use of insulin: Secondary | ICD-10-CM | POA: Diagnosis not present

## 2019-01-03 DIAGNOSIS — L308 Other specified dermatitis: Secondary | ICD-10-CM | POA: Diagnosis not present

## 2019-01-03 DIAGNOSIS — E1165 Type 2 diabetes mellitus with hyperglycemia: Secondary | ICD-10-CM | POA: Diagnosis not present

## 2019-01-03 DIAGNOSIS — L280 Lichen simplex chronicus: Secondary | ICD-10-CM | POA: Diagnosis not present

## 2019-01-03 DIAGNOSIS — L292 Pruritus vulvae: Secondary | ICD-10-CM | POA: Diagnosis not present

## 2019-01-08 DIAGNOSIS — M545 Low back pain: Secondary | ICD-10-CM | POA: Diagnosis not present

## 2019-01-08 DIAGNOSIS — M961 Postlaminectomy syndrome, not elsewhere classified: Secondary | ICD-10-CM | POA: Diagnosis not present

## 2019-01-08 DIAGNOSIS — Z79891 Long term (current) use of opiate analgesic: Secondary | ICD-10-CM | POA: Diagnosis not present

## 2019-01-08 DIAGNOSIS — G894 Chronic pain syndrome: Secondary | ICD-10-CM | POA: Diagnosis not present

## 2019-01-18 ENCOUNTER — Telehealth (HOSPITAL_COMMUNITY): Payer: Self-pay

## 2019-01-18 NOTE — Telephone Encounter (Signed)
Called to schedule f/u mri, no answer, left vm. AW 

## 2019-01-26 DIAGNOSIS — R21 Rash and other nonspecific skin eruption: Secondary | ICD-10-CM | POA: Diagnosis not present

## 2019-01-26 DIAGNOSIS — N7689 Other specified inflammation of vagina and vulva: Secondary | ICD-10-CM | POA: Diagnosis not present

## 2019-02-12 DIAGNOSIS — L439 Lichen planus, unspecified: Secondary | ICD-10-CM | POA: Diagnosis not present

## 2019-02-12 DIAGNOSIS — L718 Other rosacea: Secondary | ICD-10-CM | POA: Diagnosis not present

## 2019-02-12 DIAGNOSIS — D485 Neoplasm of uncertain behavior of skin: Secondary | ICD-10-CM | POA: Diagnosis not present

## 2019-02-16 DIAGNOSIS — M545 Low back pain: Secondary | ICD-10-CM | POA: Diagnosis not present

## 2019-02-16 DIAGNOSIS — G894 Chronic pain syndrome: Secondary | ICD-10-CM | POA: Diagnosis not present

## 2019-02-16 DIAGNOSIS — M961 Postlaminectomy syndrome, not elsewhere classified: Secondary | ICD-10-CM | POA: Diagnosis not present

## 2019-02-16 DIAGNOSIS — Z79891 Long term (current) use of opiate analgesic: Secondary | ICD-10-CM | POA: Diagnosis not present

## 2019-02-19 ENCOUNTER — Telehealth (HOSPITAL_COMMUNITY): Payer: Self-pay

## 2019-02-19 NOTE — Telephone Encounter (Signed)
Called to schedule f/u mri, no answer, no vm. AW

## 2019-02-20 DIAGNOSIS — N7689 Other specified inflammation of vagina and vulva: Secondary | ICD-10-CM | POA: Diagnosis not present

## 2019-03-01 DIAGNOSIS — M961 Postlaminectomy syndrome, not elsewhere classified: Secondary | ICD-10-CM | POA: Diagnosis not present

## 2019-03-01 DIAGNOSIS — G894 Chronic pain syndrome: Secondary | ICD-10-CM | POA: Diagnosis not present

## 2019-03-01 DIAGNOSIS — M545 Low back pain: Secondary | ICD-10-CM | POA: Diagnosis not present

## 2019-03-01 DIAGNOSIS — Z79891 Long term (current) use of opiate analgesic: Secondary | ICD-10-CM | POA: Diagnosis not present

## 2019-03-29 DIAGNOSIS — G894 Chronic pain syndrome: Secondary | ICD-10-CM | POA: Diagnosis not present

## 2019-03-29 DIAGNOSIS — M545 Low back pain: Secondary | ICD-10-CM | POA: Diagnosis not present

## 2019-03-29 DIAGNOSIS — M961 Postlaminectomy syndrome, not elsewhere classified: Secondary | ICD-10-CM | POA: Diagnosis not present

## 2019-03-29 DIAGNOSIS — Z79891 Long term (current) use of opiate analgesic: Secondary | ICD-10-CM | POA: Diagnosis not present

## 2019-04-05 ENCOUNTER — Telehealth (HOSPITAL_COMMUNITY): Payer: Self-pay

## 2019-04-05 NOTE — Telephone Encounter (Signed)
Called to schedule f/u mri, no answer, left vm. AW 

## 2019-04-23 DIAGNOSIS — G894 Chronic pain syndrome: Secondary | ICD-10-CM | POA: Diagnosis not present

## 2019-04-23 DIAGNOSIS — E1065 Type 1 diabetes mellitus with hyperglycemia: Secondary | ICD-10-CM | POA: Diagnosis not present

## 2019-04-23 DIAGNOSIS — F5101 Primary insomnia: Secondary | ICD-10-CM | POA: Diagnosis not present

## 2019-04-23 DIAGNOSIS — E1042 Type 1 diabetes mellitus with diabetic polyneuropathy: Secondary | ICD-10-CM | POA: Diagnosis not present

## 2019-04-23 DIAGNOSIS — N3281 Overactive bladder: Secondary | ICD-10-CM | POA: Diagnosis not present

## 2019-04-23 DIAGNOSIS — F330 Major depressive disorder, recurrent, mild: Secondary | ICD-10-CM | POA: Diagnosis not present

## 2019-04-23 DIAGNOSIS — E7800 Pure hypercholesterolemia, unspecified: Secondary | ICD-10-CM | POA: Diagnosis not present

## 2019-04-23 DIAGNOSIS — I2510 Atherosclerotic heart disease of native coronary artery without angina pectoris: Secondary | ICD-10-CM | POA: Diagnosis not present

## 2019-04-23 DIAGNOSIS — I10 Essential (primary) hypertension: Secondary | ICD-10-CM | POA: Diagnosis not present

## 2019-04-24 ENCOUNTER — Telehealth (HOSPITAL_COMMUNITY): Payer: Self-pay

## 2019-04-24 ENCOUNTER — Other Ambulatory Visit (HOSPITAL_COMMUNITY): Payer: Self-pay | Admitting: Interventional Radiology

## 2019-04-24 DIAGNOSIS — I671 Cerebral aneurysm, nonruptured: Secondary | ICD-10-CM

## 2019-04-24 NOTE — Telephone Encounter (Signed)
Called to schedule f/u mri, no answer, left vm. AW 

## 2019-04-26 ENCOUNTER — Other Ambulatory Visit: Payer: Self-pay | Admitting: Family Medicine

## 2019-04-26 DIAGNOSIS — Z1231 Encounter for screening mammogram for malignant neoplasm of breast: Secondary | ICD-10-CM

## 2019-05-02 DIAGNOSIS — Z79891 Long term (current) use of opiate analgesic: Secondary | ICD-10-CM | POA: Diagnosis not present

## 2019-05-02 DIAGNOSIS — M961 Postlaminectomy syndrome, not elsewhere classified: Secondary | ICD-10-CM | POA: Diagnosis not present

## 2019-05-02 DIAGNOSIS — M545 Low back pain: Secondary | ICD-10-CM | POA: Diagnosis not present

## 2019-05-02 DIAGNOSIS — G894 Chronic pain syndrome: Secondary | ICD-10-CM | POA: Diagnosis not present

## 2019-05-08 DIAGNOSIS — E1140 Type 2 diabetes mellitus with diabetic neuropathy, unspecified: Secondary | ICD-10-CM | POA: Diagnosis not present

## 2019-05-08 DIAGNOSIS — I10 Essential (primary) hypertension: Secondary | ICD-10-CM | POA: Diagnosis not present

## 2019-05-08 DIAGNOSIS — K3184 Gastroparesis: Secondary | ICD-10-CM | POA: Diagnosis not present

## 2019-05-08 DIAGNOSIS — E7800 Pure hypercholesterolemia, unspecified: Secondary | ICD-10-CM | POA: Diagnosis not present

## 2019-05-08 DIAGNOSIS — E041 Nontoxic single thyroid nodule: Secondary | ICD-10-CM | POA: Diagnosis not present

## 2019-05-08 DIAGNOSIS — E1065 Type 1 diabetes mellitus with hyperglycemia: Secondary | ICD-10-CM | POA: Diagnosis not present

## 2019-05-14 ENCOUNTER — Ambulatory Visit (HOSPITAL_COMMUNITY): Payer: Medicare HMO

## 2019-05-14 ENCOUNTER — Ambulatory Visit (HOSPITAL_COMMUNITY): Admission: RE | Admit: 2019-05-14 | Payer: Medicare HMO | Source: Ambulatory Visit

## 2019-05-17 ENCOUNTER — Other Ambulatory Visit: Payer: Self-pay

## 2019-05-17 ENCOUNTER — Ambulatory Visit (INDEPENDENT_AMBULATORY_CARE_PROVIDER_SITE_OTHER): Payer: Medicare HMO | Admitting: Cardiology

## 2019-05-17 ENCOUNTER — Encounter: Payer: Self-pay | Admitting: Cardiology

## 2019-05-17 VITALS — Temp 97.1°F | Ht 63.0 in | Wt 173.0 lb

## 2019-05-17 DIAGNOSIS — I25118 Atherosclerotic heart disease of native coronary artery with other forms of angina pectoris: Secondary | ICD-10-CM

## 2019-05-17 DIAGNOSIS — I10 Essential (primary) hypertension: Secondary | ICD-10-CM | POA: Diagnosis not present

## 2019-05-17 DIAGNOSIS — Z794 Long term (current) use of insulin: Secondary | ICD-10-CM | POA: Diagnosis not present

## 2019-05-17 DIAGNOSIS — E7800 Pure hypercholesterolemia, unspecified: Secondary | ICD-10-CM

## 2019-05-17 DIAGNOSIS — E119 Type 2 diabetes mellitus without complications: Secondary | ICD-10-CM

## 2019-05-17 DIAGNOSIS — R0600 Dyspnea, unspecified: Secondary | ICD-10-CM | POA: Diagnosis not present

## 2019-05-17 DIAGNOSIS — R0609 Other forms of dyspnea: Secondary | ICD-10-CM

## 2019-05-17 NOTE — Progress Notes (Signed)
Primary Physician:  Shirline Frees, MD   Patient ID: Mallory Mendez, female    DOB: 1955-10-07, 63 y.o.   MRN: [id number]  Subjective:    Chief Complaint  Patient presents with  . Dizziness  . Nausea  . Chest Pain  . Follow-up    HPI: Mallory Mendez  is a 63 y.o. female  with diabetes, hypertension, hyperlipidemia, history of cerebral aneurysm repair in 2012, continued tobacco abuse, moderate diffuse CAD by coronary angiogram in June 2017, last seen in 2018, now presents for intermittent dizziness, nausea, and chest pressure at the request of her PCP, Dr. Kenton Kingfisher.   She reports an episode 2 weeks ago where she suddenly had chest pressure that radiated to her left arm and associated shortness of breath that finally resolved after nitroglycerin x 2. She has also noticed over the last several weeks shortness of breath with activities, states that she is unable to do things like she use to. Blood pressure has been uncontrolled recently. She is unsure of some of her medications.   She does have episodes of dizziness. No syncope or near syncope.   Unfortunately, she continues to smoke 0.5 pack per day.   Past Medical History:  Diagnosis Date  . Anxiety   . Arthritis   . Blood transfusion   . Brain injury ([place])    1 1/2 YR AGO  . Chronic constipation   . Chronic stomach ulcer   . Concussion    HAS HAD DIZZINESS/ MEMORY PROBLEMS/ SLOW SPEACH /BALANCE PROBLEMS SINCE CONCUSSION 1 1/2 YR AGO PR GOES TO THERAPY  . Concussion   . Depression   . Diabetes mellitus   . Diabetes mellitus   . Dizziness   . Fibromyalgia   . Full dentures   . GERD (gastroesophageal reflux disease)   . Headache(784.0)   . Hernia    incisional  . History of back surgery    multiple  . Hyperlipidemia   . Hypertension   . MRSA (methicillin resistant Staphylococcus aureus)   . Nocturia   . Paroxysmal vertigo   . Scar   . Sleep difficulties   . Stroke ([place])    crica 2002  . Umbilical hernia   .  Wears glasses     Past Surgical History:  Procedure Laterality Date  . ABDOMINAL HYSTERECTOMY    . APPENDECTOMY    . BACK SURGERY     SPINAL FUSION X6   . CARDIAC CATHETERIZATION N/A 12/23/2015   Procedure: Left Heart Cath and Coronary Angiography;  Surgeon: Adrian Prows, MD;  Location: [facility];  Service: Cardiovascular;  Laterality: N/A;  . CEREBRAL ANEURYSM REPAIR    . CHOLECYSTECTOMY  1984  . EYE SURGERY  2010   bilateral  . HERNIA REPAIR     09/21/2010  . HERNIA REPAIR  05/19/11   incisional hernia repair   . INCISIONAL HERNIA REPAIR  05/19/2011   Procedure: HERNIA REPAIR INCISIONAL;  Surgeon: Willey Blade, MD;  Location: [facility];  Service: General;  Laterality: N/A;  repair incisional hernia with mesh  . IR ANGIO INTRA EXTRACRAN SEL COM CAROTID INNOMINATE BILAT MOD SED  05/09/2018  . IR ANGIO VERTEBRAL SEL VERTEBRAL BILAT MOD SED  05/09/2018  . IR GENERIC HISTORICAL  09/30/2016   IR ANGIO VERTEBRAL SEL VERTEBRAL BILAT MOD SED 09/30/2016 Luanne Bras, MD [facility]  . IR GENERIC HISTORICAL  09/30/2016   IR ANGIO INTRA EXTRACRAN SEL COM CAROTID INNOMINATE BILAT MOD SED 09/30/2016  Luanne Bras, MD [facility]  . LEFT HEART CATHETERIZATION WITH CORONARY ANGIOGRAM N/A 11/05/2013   Procedure: LEFT HEART CATHETERIZATION WITH CORONARY ANGIOGRAM;  Surgeon: Laverda Page, MD;  Location: [facility];  Service: Cardiovascular;  Laterality: N/A;  . SPINAL FUSION  2006  . TUBAL LIGATION      Social History   Socioeconomic History  . Marital status: Widowed    Spouse name: Not on file  . Number of children: 5  . Years of education: Not on file  . Highest education level: Not on file  Occupational History  . Occupation: disabled  Social Needs  . Financial resource strain: Not on file  . Food insecurity    Worry: Not on file    Inability: Not on file  . Transportation needs    Medical: Not on file    Non-medical: Not on file  Tobacco Use  . Smoking  status: Current Every Day Smoker    Packs/day: 0.50    Years: 38.00    Pack years: 19.00    Types: Cigarettes  . Smokeless tobacco: Never Used  Substance and Sexual Activity  . Alcohol use: No  . Drug use: No  . Sexual activity: Not on file  Lifestyle  . Physical activity    Days per week: Not on file    Minutes per session: Not on file  . Stress: Not on file  Relationships  . Social Herbalist on phone: Not on file    Gets together: Not on file    Attends religious service: Not on file    Active member of club or organization: Not on file    Attends meetings of clubs or organizations: Not on file    Relationship status: Not on file  . Intimate partner violence    Fear of current or ex partner: Not on file    Emotionally abused: Not on file    Physically abused: Not on file    Forced sexual activity: Not on file  Other Topics Concern  . Not on file  Social History Narrative  . Not on file    Review of Systems  Constitution: Negative for decreased appetite, malaise/fatigue, weight gain and weight loss.  Eyes: Negative for visual disturbance.  Cardiovascular: Positive for chest pain, dyspnea on exertion and leg swelling. Negative for claudication, orthopnea, palpitations and syncope.  Respiratory: Negative for hemoptysis and wheezing.   Endocrine: Negative for cold intolerance and heat intolerance.  Hematologic/Lymphatic: Does not bruise/bleed easily.  Skin: Negative for nail changes.  Musculoskeletal: Positive for back pain. Negative for muscle weakness and myalgias.  Gastrointestinal: Negative for abdominal pain, change in bowel habit, nausea and vomiting.  Neurological: Negative for difficulty with concentration, dizziness, focal weakness and headaches.  Psychiatric/Behavioral: Negative for altered mental status and suicidal ideas.  All other systems reviewed and are negative.     Objective:  Temperature (!) 97.1 F (36.2 C), height 5\' 3"  (1.6 m), weight  173 lb (78.5 kg), SpO2 96 %. Body mass index is 30.65 kg/m.    BP supine: 190/91 BP sitting 151/93 BP standing 189/92  Physical Exam  Constitutional: She is oriented to person, place, and time. Vital signs are normal. She appears well-developed and well-nourished.  HENT:  Head: Normocephalic and atraumatic.  Neck: Normal range of motion.  Cardiovascular: Normal rate, regular rhythm, normal heart sounds and intact distal pulses.  Pulses:      Carotid pulses are on the right side with  bruit and on the left side with bruit. Pulmonary/Chest: Effort normal and breath sounds normal. No accessory muscle usage. No respiratory distress.  Abdominal: Soft. Bowel sounds are normal.  Musculoskeletal: Normal range of motion.  Neurological: She is alert and oriented to person, place, and time.  Skin: Skin is warm and dry.  Vitals reviewed.  Radiology: No results found.  Laboratory examination:   Labs 12/17/2016: Total cholesterol 156, triglycerides 114, HDL 48, LDL 85  CMP Latest Ref Rng & Units 05/09/2018 10/20/2017 09/30/2016  Glucose 70 - 99 mg/dL 166(H) - 204(H)  BUN 8 - 23 mg/dL 14 - 17  Creatinine 0.44 - 1.00 mg/dL 0.58 0.55 0.55  Sodium 135 - 145 mmol/L 135 - 134(L)  Potassium 3.5 - 5.1 mmol/L 3.7 - 3.8  Chloride 98 - 111 mmol/L 104 - 99(L)  CO2 22 - 32 mmol/L 24 - 28  Calcium 8.9 - 10.3 mg/dL 8.9 - 9.2  Total Protein 6.0 - 8.3 g/dL - - -  Total Bilirubin 0.3 - 1.2 mg/dL - - -  Alkaline Phos 39 - 117 U/L - - -  AST 0 - 37 U/L - - -  ALT 0 - 35 U/L - - -   CBC Latest Ref Rng & Units 05/09/2018 09/30/2016 08/05/2015  WBC 4.0 - 10.5 K/uL 8.2 12.7(H) 10.0  Hemoglobin 12.0 - 15.0 g/dL 12.6 13.9 13.3  Hematocrit 36.0 - 46.0 % 37.7 41.8 40.4  Platelets 150 - 400 K/uL 174 189 157   Lipid Panel     Component Value Date/Time   CHOL 184 10/30/2013 0057   TRIG 244 (H) 10/30/2013 0057   HDL 40 10/30/2013 0057   CHOLHDL 4.6 10/30/2013 0057   VLDL 49 (H) 10/30/2013 0057   LDLCALC 95  10/30/2013 0057   HEMOGLOBIN A1C No results found for: HGBA1C, MPG TSH No results for input(s): TSH in the last 8760 hours.  PRN Meds:. Medications Discontinued During This Encounter  Medication Reason  . furosemide (LASIX) 20 MG tablet Error  . meclizine (ANTIVERT) 12.5 MG tablet Error  . metoprolol succinate (TOPROL-XL) 25 MG 24 hr tablet Error  . nitroGLYCERIN (NITROLINGUAL) 0.4 MG/SPRAY spray Error   Current Meds  Medication Sig  . ALPRAZolam (XANAX) 1 MG tablet Take 1 mg by mouth 3 (three) times daily as needed for anxiety.   . baclofen (LIORESAL) 10 MG tablet Take 10 mg by mouth daily.   Marland Kitchen buPROPion (WELLBUTRIN XL) 150 MG 24 hr tablet Take 150 mg by mouth daily.  Marland Kitchen esomeprazole (NEXIUM) 40 MG capsule Take 40 mg by mouth every evening.   Marland Kitchen estradiol (ESTRACE) 1 MG tablet Take 1 mg by mouth daily.  Marland Kitchen gabapentin (NEURONTIN) 800 MG tablet Take 800 mg by mouth 2 (two) times daily.   . insulin aspart (NOVOLOG FLEXPEN) 100 UNIT/ML FlexPen Inject 8-12 Units into the skin 3 (three) times daily with meals. Sliding scale  . insulin glargine (LANTUS) 100 UNIT/ML injection Inject 28-42 Units into the skin 2 (two) times daily. 28 units in the morning, and 42 units at bedtime  . isosorbide mononitrate (ISMO,MONOKET) 10 MG tablet Take 10 mg by mouth daily.  Marland Kitchen levocetirizine (XYZAL) 5 MG tablet Take 5 mg by mouth every evening.  Marland Kitchen losartan (COZAAR) 100 MG tablet Take 100 mg by mouth daily.  . nitroGLYCERIN (NITROSTAT) 0.4 MG SL tablet Place 0.4 mg under the tongue every 5 (five) minutes as needed for chest pain.  Marland Kitchen oxybutynin (DITROPAN) 5 MG tablet Take 5 mg  by mouth daily.  Marland Kitchen oxyCODONE ER (XTAMPZA ER) 27 MG C12A Take 1 tablet by mouth 2 (two) times daily.  Marland Kitchen oxyCODONE-acetaminophen (PERCOCET) 10-325 MG per tablet Take 1 tablet by mouth 3 (three) times daily.   Marland Kitchen torsemide (DEMADEX) 20 MG tablet Take 20 mg by mouth daily.  . [DISCONTINUED] furosemide (LASIX) 20 MG tablet Take 20 mg by mouth  every other day.  . [DISCONTINUED] metoprolol succinate (TOPROL-XL) 25 MG 24 hr tablet Take 25 mg by mouth daily.  . [DISCONTINUED] nitroGLYCERIN (NITROLINGUAL) 0.4 MG/SPRAY spray Place 1 spray under the tongue every 5 (five) minutes x 3 doses as needed for chest pain.   Medications Discontinued During This Encounter  Medication Reason  . furosemide (LASIX) 20 MG tablet Error  . meclizine (ANTIVERT) 12.5 MG tablet Error  . metoprolol succinate (TOPROL-XL) 25 MG 24 hr tablet Error  . nitroGLYCERIN (NITROLINGUAL) 0.4 MG/SPRAY spray Error  . isosorbide mononitrate (ISMO,MONOKET) 10 MG tablet Dose change  . amLODipine (NORVASC) 10 MG tablet Reorder     Cardiac Studies:   Carotid artery duplex 03/21/2017: No hemodynamically significant arterial disease in the internal carotid artery bilaterally. Antegrade right vertebral artery flow. Antegrade left vertebral artery flow.  Coronary angiogram 12/23/2015: LVEF 55%. Mid Cx 60% and a tandem 50% stenosis, hazy, soft plaque, unchanged from 2015. Prox LAD diffuse long 20-30% stenosis, mid to distal LAD small and mildly tortuous and ends at the apex. Large RCA, ostial 20%, PDA branch 50% stenosis.  Nuclear stress test 12/05/2015: 1. The resting electrocardiogram demonstrated normal sinus rhythm, normal resting conduction and no resting arrhythmias.  The stress electrocardiogram was abnormal.  The stress electrocardiogram revealed 2 mm of horizontal ST depression in lead (s):II, III, aVF, V5, V6 consistent with myocardial ischemia.  The ST segments were back to baseline immediately into recovery however there was reappearance of ST depression at 3 minutes into recovery.  There was also occasional PVCs.  Stress symptoms included dyspnea.  Patient exercised on Bruce protocol for 5.0  minutes and achieved 4.64 METS. Stress test terminated due to target heart rate( 1.1% MPHR) and dyspnea.  2.  Left ventricular cavity is noted to be enlarged on the stress study.   The TID index was 1.41. SPECT images demonstrate homogeneous tracer distribution throughout the myocardium.  Gated SPECT images reveal normal myocardial thickening and wall motion.  The left ventricular ejection fraction was calculated or visually estimated to be 63%.  This is an intermediate risk study, clinical correlation recommended. This is a high  risk study in view of abnormal EKG response and transient ischemic dilatation of LV noted on stress images.  clinical correlation recommended.  Assessment:   Atherosclerosis of native coronary artery of native heart with stable angina pectoris ([place]) - Plan: EKG 12-Lead  Primary hypertension  Hyperlipidemia, group A  Type 2 diabetes mellitus without complication, with long-term current use of insulin ([place])  EKG 05/17/2019: Normal sinus rhythm at 60 bpm, normal axis, no evidence of ischemia.  Recommendations:   Patient was last seen in 2018, due to recent episode of chest pain and also dyspnea on exertion. Her symptoms are concerning for angina. She has previously been found to have moderate diffuse CAD on coronary angiogram in 2016 after having abnormal nuclear nuclear stress test. I have discussed options for further evaluation including stress test vs. Coronary angiogram. Given her concerning symptoms and moderate CAD, I feel that her best option is proceeding with coronary angiogram. Patient agrees with this. Schedule for  cardiac catheterization, and possible angioplasty. We discussed regarding risks, benefits, alternatives to this including stress testing, CTA and continued medical therapy. Patient wants to proceed. Understands <1-2% risk of death, stroke, MI, urgent CABG, bleeding, infection, renal failure but not limited to these.  Blood pressure is markedly elevated. I will start amlodipine 10 mg daily for blood pressure control and chest pain. She is on ARB and Beta blocker therapy. Will also increase her Imdur to 30 mg daily. Continue with  Aspirin 81 mg daily. I will obtain echocardiogram to be performed this week given her shortness of breath. No clinical evidence of heart failure. She reports that her lipids are well controlled, will request for our records. I will plan to see her back after the procedure for follow up.   [name], MSN, APRN, FNP-C [facility]. [place] Office: [phone] Fax: [phone]

## 2019-05-17 NOTE — H&P (View-Only) (Signed)
Primary Physician:  Shirline Frees, MD   Patient ID: Mallory Mendez, female    DOB: 11/23/1955, 63 y.o.   MRN: [id number]  Subjective:    Chief Complaint  Patient presents with  . Dizziness  . Nausea  . Chest Pain  . Follow-up    HPI: Mallory Mendez  is a 63 y.o. female  with diabetes, hypertension, hyperlipidemia, history of cerebral aneurysm repair in 2012, continued tobacco abuse, moderate diffuse CAD by coronary angiogram in June 2017, last seen in 2018, now presents for intermittent dizziness, nausea, and chest pressure at the request of her PCP, Dr. Kenton Kingfisher.   She reports an episode 2 weeks ago where she suddenly had chest pressure that radiated to her left arm and associated shortness of breath that finally resolved after nitroglycerin x 2. She has also noticed over the last several weeks shortness of breath with activities, states that she is unable to do things like she use to. Blood pressure has been uncontrolled recently. She is unsure of some of her medications.   She does have episodes of dizziness. No syncope or near syncope.   Unfortunately, she continues to smoke 0.5 pack per day.   Past Medical History:  Diagnosis Date  . Anxiety   . Arthritis   . Blood transfusion   . Brain injury ([place])    1 1/2 YR AGO  . Chronic constipation   . Chronic stomach ulcer   . Concussion    HAS HAD DIZZINESS/ MEMORY PROBLEMS/ SLOW SPEACH /BALANCE PROBLEMS SINCE CONCUSSION 1 1/2 YR AGO PR GOES TO THERAPY  . Concussion   . Depression   . Diabetes mellitus   . Diabetes mellitus   . Dizziness   . Fibromyalgia   . Full dentures   . GERD (gastroesophageal reflux disease)   . Headache(784.0)   . Hernia    incisional  . History of back surgery    multiple  . Hyperlipidemia   . Hypertension   . MRSA (methicillin resistant Staphylococcus aureus)   . Nocturia   . Paroxysmal vertigo   . Scar   . Sleep difficulties   . Stroke ([place])    crica 2002  . Umbilical hernia   .  Wears glasses     Past Surgical History:  Procedure Laterality Date  . ABDOMINAL HYSTERECTOMY    . APPENDECTOMY    . BACK SURGERY     SPINAL FUSION X6   . CARDIAC CATHETERIZATION N/A 12/23/2015   Procedure: Left Heart Cath and Coronary Angiography;  Surgeon: Adrian Prows, MD;  Location: [facility];  Service: Cardiovascular;  Laterality: N/A;  . CEREBRAL ANEURYSM REPAIR    . CHOLECYSTECTOMY  1984  . EYE SURGERY  2010   bilateral  . HERNIA REPAIR     09/21/2010  . HERNIA REPAIR  05/19/11   incisional hernia repair   . INCISIONAL HERNIA REPAIR  05/19/2011   Procedure: HERNIA REPAIR INCISIONAL;  Surgeon: Willey Blade, MD;  Location: [facility];  Service: General;  Laterality: N/A;  repair incisional hernia with mesh  . IR ANGIO INTRA EXTRACRAN SEL COM CAROTID INNOMINATE BILAT MOD SED  05/09/2018  . IR ANGIO VERTEBRAL SEL VERTEBRAL BILAT MOD SED  05/09/2018  . IR GENERIC HISTORICAL  09/30/2016   IR ANGIO VERTEBRAL SEL VERTEBRAL BILAT MOD SED 09/30/2016 Luanne Bras, MD [facility]  . IR GENERIC HISTORICAL  09/30/2016   IR ANGIO INTRA EXTRACRAN SEL COM CAROTID INNOMINATE BILAT MOD SED 09/30/2016  Luanne Bras, MD [facility]  . LEFT HEART CATHETERIZATION WITH CORONARY ANGIOGRAM N/A 11/05/2013   Procedure: LEFT HEART CATHETERIZATION WITH CORONARY ANGIOGRAM;  Surgeon: Laverda Page, MD;  Location: [facility];  Service: Cardiovascular;  Laterality: N/A;  . SPINAL FUSION  2006  . TUBAL LIGATION      Social History   Socioeconomic History  . Marital status: Widowed    Spouse name: Not on file  . Number of children: 5  . Years of education: Not on file  . Highest education level: Not on file  Occupational History  . Occupation: disabled  Social Needs  . Financial resource strain: Not on file  . Food insecurity    Worry: Not on file    Inability: Not on file  . Transportation needs    Medical: Not on file    Non-medical: Not on file  Tobacco Use  . Smoking  status: Current Every Day Smoker    Packs/day: 0.50    Years: 38.00    Pack years: 19.00    Types: Cigarettes  . Smokeless tobacco: Never Used  Substance and Sexual Activity  . Alcohol use: No  . Drug use: No  . Sexual activity: Not on file  Lifestyle  . Physical activity    Days per week: Not on file    Minutes per session: Not on file  . Stress: Not on file  Relationships  . Social Herbalist on phone: Not on file    Gets together: Not on file    Attends religious service: Not on file    Active member of club or organization: Not on file    Attends meetings of clubs or organizations: Not on file    Relationship status: Not on file  . Intimate partner violence    Fear of current or ex partner: Not on file    Emotionally abused: Not on file    Physically abused: Not on file    Forced sexual activity: Not on file  Other Topics Concern  . Not on file  Social History Narrative  . Not on file    Review of Systems  Constitution: Negative for decreased appetite, malaise/fatigue, weight gain and weight loss.  Eyes: Negative for visual disturbance.  Cardiovascular: Positive for chest pain, dyspnea on exertion and leg swelling. Negative for claudication, orthopnea, palpitations and syncope.  Respiratory: Negative for hemoptysis and wheezing.   Endocrine: Negative for cold intolerance and heat intolerance.  Hematologic/Lymphatic: Does not bruise/bleed easily.  Skin: Negative for nail changes.  Musculoskeletal: Positive for back pain. Negative for muscle weakness and myalgias.  Gastrointestinal: Negative for abdominal pain, change in bowel habit, nausea and vomiting.  Neurological: Negative for difficulty with concentration, dizziness, focal weakness and headaches.  Psychiatric/Behavioral: Negative for altered mental status and suicidal ideas.  All other systems reviewed and are negative.     Objective:  Temperature (!) 97.1 F (36.2 C), height 5\' 3"  (1.6 m), weight  173 lb (78.5 kg), SpO2 96 %. Body mass index is 30.65 kg/m.    BP supine: 190/91 BP sitting 151/93 BP standing 189/92  Physical Exam  Constitutional: She is oriented to person, place, and time. Vital signs are normal. She appears well-developed and well-nourished.  HENT:  Head: Normocephalic and atraumatic.  Neck: Normal range of motion.  Cardiovascular: Normal rate, regular rhythm, normal heart sounds and intact distal pulses.  Pulses:      Carotid pulses are on the right side with  bruit and on the left side with bruit. Pulmonary/Chest: Effort normal and breath sounds normal. No accessory muscle usage. No respiratory distress.  Abdominal: Soft. Bowel sounds are normal.  Musculoskeletal: Normal range of motion.  Neurological: She is alert and oriented to person, place, and time.  Skin: Skin is warm and dry.  Vitals reviewed.  Radiology: No results found.  Laboratory examination:   Labs 12/17/2016: Total cholesterol 156, triglycerides 114, HDL 48, LDL 85  CMP Latest Ref Rng & Units 05/09/2018 10/20/2017 09/30/2016  Glucose 70 - 99 mg/dL 166(H) - 204(H)  BUN 8 - 23 mg/dL 14 - 17  Creatinine 0.44 - 1.00 mg/dL 0.58 0.55 0.55  Sodium 135 - 145 mmol/L 135 - 134(L)  Potassium 3.5 - 5.1 mmol/L 3.7 - 3.8  Chloride 98 - 111 mmol/L 104 - 99(L)  CO2 22 - 32 mmol/L 24 - 28  Calcium 8.9 - 10.3 mg/dL 8.9 - 9.2  Total Protein 6.0 - 8.3 g/dL - - -  Total Bilirubin 0.3 - 1.2 mg/dL - - -  Alkaline Phos 39 - 117 U/L - - -  AST 0 - 37 U/L - - -  ALT 0 - 35 U/L - - -   CBC Latest Ref Rng & Units 05/09/2018 09/30/2016 08/05/2015  WBC 4.0 - 10.5 K/uL 8.2 12.7(H) 10.0  Hemoglobin 12.0 - 15.0 g/dL 12.6 13.9 13.3  Hematocrit 36.0 - 46.0 % 37.7 41.8 40.4  Platelets 150 - 400 K/uL 174 189 157   Lipid Panel     Component Value Date/Time   CHOL 184 10/30/2013 0057   TRIG 244 (H) 10/30/2013 0057   HDL 40 10/30/2013 0057   CHOLHDL 4.6 10/30/2013 0057   VLDL 49 (H) 10/30/2013 0057   LDLCALC 95  10/30/2013 0057   HEMOGLOBIN A1C No results found for: HGBA1C, MPG TSH No results for input(s): TSH in the last 8760 hours.  PRN Meds:. Medications Discontinued During This Encounter  Medication Reason  . furosemide (LASIX) 20 MG tablet Error  . meclizine (ANTIVERT) 12.5 MG tablet Error  . metoprolol succinate (TOPROL-XL) 25 MG 24 hr tablet Error  . nitroGLYCERIN (NITROLINGUAL) 0.4 MG/SPRAY spray Error   Current Meds  Medication Sig  . ALPRAZolam (XANAX) 1 MG tablet Take 1 mg by mouth 3 (three) times daily as needed for anxiety.   . baclofen (LIORESAL) 10 MG tablet Take 10 mg by mouth daily.   Marland Kitchen buPROPion (WELLBUTRIN XL) 150 MG 24 hr tablet Take 150 mg by mouth daily.  Marland Kitchen esomeprazole (NEXIUM) 40 MG capsule Take 40 mg by mouth every evening.   Marland Kitchen estradiol (ESTRACE) 1 MG tablet Take 1 mg by mouth daily.  Marland Kitchen gabapentin (NEURONTIN) 800 MG tablet Take 800 mg by mouth 2 (two) times daily.   . insulin aspart (NOVOLOG FLEXPEN) 100 UNIT/ML FlexPen Inject 8-12 Units into the skin 3 (three) times daily with meals. Sliding scale  . insulin glargine (LANTUS) 100 UNIT/ML injection Inject 28-42 Units into the skin 2 (two) times daily. 28 units in the morning, and 42 units at bedtime  . isosorbide mononitrate (ISMO,MONOKET) 10 MG tablet Take 10 mg by mouth daily.  Marland Kitchen levocetirizine (XYZAL) 5 MG tablet Take 5 mg by mouth every evening.  Marland Kitchen losartan (COZAAR) 100 MG tablet Take 100 mg by mouth daily.  . nitroGLYCERIN (NITROSTAT) 0.4 MG SL tablet Place 0.4 mg under the tongue every 5 (five) minutes as needed for chest pain.  Marland Kitchen oxybutynin (DITROPAN) 5 MG tablet Take 5 mg  by mouth daily.  Marland Kitchen oxyCODONE ER (XTAMPZA ER) 27 MG C12A Take 1 tablet by mouth 2 (two) times daily.  Marland Kitchen oxyCODONE-acetaminophen (PERCOCET) 10-325 MG per tablet Take 1 tablet by mouth 3 (three) times daily.   Marland Kitchen torsemide (DEMADEX) 20 MG tablet Take 20 mg by mouth daily.  . [DISCONTINUED] furosemide (LASIX) 20 MG tablet Take 20 mg by mouth  every other day.  . [DISCONTINUED] metoprolol succinate (TOPROL-XL) 25 MG 24 hr tablet Take 25 mg by mouth daily.  . [DISCONTINUED] nitroGLYCERIN (NITROLINGUAL) 0.4 MG/SPRAY spray Place 1 spray under the tongue every 5 (five) minutes x 3 doses as needed for chest pain.   Medications Discontinued During This Encounter  Medication Reason  . furosemide (LASIX) 20 MG tablet Error  . meclizine (ANTIVERT) 12.5 MG tablet Error  . metoprolol succinate (TOPROL-XL) 25 MG 24 hr tablet Error  . nitroGLYCERIN (NITROLINGUAL) 0.4 MG/SPRAY spray Error  . isosorbide mononitrate (ISMO,MONOKET) 10 MG tablet Dose change  . amLODipine (NORVASC) 10 MG tablet Reorder     Cardiac Studies:   Carotid artery duplex 13-Mar-2017: No hemodynamically significant arterial disease in the internal carotid artery bilaterally. Antegrade right vertebral artery flow. Antegrade left vertebral artery flow.  Coronary angiogram 12/23/2015: LVEF 55%. Mid Cx 60% and a tandem 50% stenosis, hazy, soft plaque, unchanged from 2015. Prox LAD diffuse long 20-30% stenosis, mid to distal LAD small and mildly tortuous and ends at the apex. Large RCA, ostial 20%, PDA branch 50% stenosis.  Nuclear stress test 12/05/2015: 1. The resting electrocardiogram demonstrated normal sinus rhythm, normal resting conduction and no resting arrhythmias.  The stress electrocardiogram was abnormal.  The stress electrocardiogram revealed 2 mm of horizontal ST depression in lead (s):II, III, aVF, V5, V6 consistent with myocardial ischemia.  The ST segments were back to baseline immediately into recovery however there was reappearance of ST depression at 3 minutes into recovery.  There was also occasional PVCs.  Stress symptoms included dyspnea.  Patient exercised on Bruce protocol for 5.0  minutes and achieved 4.64 METS. Stress test terminated due to target heart rate( 1.1% MPHR) and dyspnea.  2.  Left ventricular cavity is noted to be enlarged on the stress study.   The TID index was 1.41. SPECT images demonstrate homogeneous tracer distribution throughout the myocardium.  Gated SPECT images reveal normal myocardial thickening and wall motion.  The left ventricular ejection fraction was calculated or visually estimated to be 63%.  This is an intermediate risk study, clinical correlation recommended. This is a high  risk study in view of abnormal EKG response and transient ischemic dilatation of LV noted on stress images.  clinical correlation recommended.  Assessment:   Atherosclerosis of native coronary artery of native heart with stable angina pectoris ([place]) - Plan: EKG 12-Lead  Primary hypertension  Hyperlipidemia, group A  Type 2 diabetes mellitus without complication, with long-term current use of insulin ([place])  EKG 05/17/2019: Normal sinus rhythm at 60 bpm, normal axis, no evidence of ischemia.  Recommendations:   Patient was last seen in 2018, due to recent episode of chest pain and also dyspnea on exertion. Her symptoms are concerning for angina. She has previously been found to have moderate diffuse CAD on coronary angiogram in 2016 after having abnormal nuclear nuclear stress test. I have discussed options for further evaluation including stress test vs. Coronary angiogram. Given her concerning symptoms and moderate CAD, I feel that her best option is proceeding with coronary angiogram. Patient agrees with this. Schedule for  cardiac catheterization, and possible angioplasty. We discussed regarding risks, benefits, alternatives to this including stress testing, CTA and continued medical therapy. Patient wants to proceed. Understands <1-2% risk of death, stroke, MI, urgent CABG, bleeding, infection, renal failure but not limited to these.  Blood pressure is markedly elevated. I will start amlodipine 10 mg daily for blood pressure control and chest pain. She is on ARB and Beta blocker therapy. Will also increase her Imdur to 30 mg daily. Continue with  Aspirin 81 mg daily. I will obtain echocardiogram to be performed this week given her shortness of breath. No clinical evidence of heart failure. She reports that her lipids are well controlled, will request for our records. I will plan to see her back after the procedure for follow up.   [name], MSN, APRN, FNP-C [facility]. [place] Office: [phone] Fax: [phone]

## 2019-05-18 ENCOUNTER — Other Ambulatory Visit: Payer: Self-pay

## 2019-05-18 ENCOUNTER — Ambulatory Visit (INDEPENDENT_AMBULATORY_CARE_PROVIDER_SITE_OTHER): Payer: Medicare HMO

## 2019-05-18 DIAGNOSIS — R0609 Other forms of dyspnea: Secondary | ICD-10-CM | POA: Diagnosis not present

## 2019-05-18 MED ORDER — ISOSORBIDE MONONITRATE ER 30 MG PO TB24: 30.0000 mg | ORAL_TABLET | Freq: Every day | ORAL | 0 refills | Status: DC

## 2019-05-18 MED ORDER — AMLODIPINE BESYLATE 10 MG PO TABS: 10.0000 mg | ORAL_TABLET | Freq: Every day | ORAL | 1 refills | Status: DC

## 2019-05-18 MED ORDER — AMLODIPINE BESYLATE 10 MG PO TABS: 10.0000 mg | ORAL_TABLET | Freq: Every day | ORAL | 0 refills | Status: DC

## 2019-05-19 ENCOUNTER — Other Ambulatory Visit (HOSPITAL_COMMUNITY)
Admission: RE | Admit: 2019-05-19 | Discharge: 2019-05-19 | Disposition: A | Discharge status: Home / Self Care | Payer: Medicare HMO | Source: Ambulatory Visit | Attending: Cardiology | Admitting: Cardiology

## 2019-05-19 DIAGNOSIS — Z20828 Contact with and (suspected) exposure to other viral communicable diseases: Secondary | ICD-10-CM | POA: Diagnosis not present

## 2019-05-19 DIAGNOSIS — Z01812 Encounter for preprocedural laboratory examination: Secondary | ICD-10-CM | POA: Diagnosis not present

## 2019-05-20 LAB — NOVEL CORONAVIRUS, NAA (HOSP ORDER, SEND-OUT TO REF LAB; TAT 18-24 HRS): SARS-CoV-2, NAA: NOT DETECTED

## 2019-05-21 DIAGNOSIS — I25118 Atherosclerotic heart disease of native coronary artery with other forms of angina pectoris: Secondary | ICD-10-CM | POA: Diagnosis not present

## 2019-05-22 ENCOUNTER — Encounter (HOSPITAL_COMMUNITY)
Admission: RE | Disposition: A | Discharge status: Home / Self Care | Payer: Self-pay | Source: Home / Self Care | Attending: Cardiology

## 2019-05-22 ENCOUNTER — Ambulatory Visit (HOSPITAL_COMMUNITY)
Admission: RE | Admit: 2019-05-22 | Discharge: 2019-05-22 | Disposition: A | Discharge status: Home / Self Care | Payer: Medicare HMO | Attending: Cardiology | Admitting: Cardiology

## 2019-05-22 ENCOUNTER — Other Ambulatory Visit: Payer: Self-pay

## 2019-05-22 DIAGNOSIS — I25118 Atherosclerotic heart disease of native coronary artery with other forms of angina pectoris: Secondary | ICD-10-CM

## 2019-05-22 DIAGNOSIS — E119 Type 2 diabetes mellitus without complications: Secondary | ICD-10-CM | POA: Diagnosis not present

## 2019-05-22 DIAGNOSIS — Z8679 Personal history of other diseases of the circulatory system: Secondary | ICD-10-CM | POA: Insufficient documentation

## 2019-05-22 DIAGNOSIS — E785 Hyperlipidemia, unspecified: Secondary | ICD-10-CM | POA: Diagnosis not present

## 2019-05-22 DIAGNOSIS — I209 Angina pectoris, unspecified: Secondary | ICD-10-CM | POA: Diagnosis present

## 2019-05-22 DIAGNOSIS — Z8673 Personal history of transient ischemic attack (TIA), and cerebral infarction without residual deficits: Secondary | ICD-10-CM | POA: Insufficient documentation

## 2019-05-22 DIAGNOSIS — Z794 Long term (current) use of insulin: Secondary | ICD-10-CM | POA: Diagnosis not present

## 2019-05-22 DIAGNOSIS — Z79899 Other long term (current) drug therapy: Secondary | ICD-10-CM | POA: Diagnosis not present

## 2019-05-22 DIAGNOSIS — K219 Gastro-esophageal reflux disease without esophagitis: Secondary | ICD-10-CM | POA: Insufficient documentation

## 2019-05-22 DIAGNOSIS — M1990 Unspecified osteoarthritis, unspecified site: Secondary | ICD-10-CM | POA: Diagnosis not present

## 2019-05-22 DIAGNOSIS — M797 Fibromyalgia: Secondary | ICD-10-CM | POA: Diagnosis not present

## 2019-05-22 DIAGNOSIS — I25119 Atherosclerotic heart disease of native coronary artery with unspecified angina pectoris: Secondary | ICD-10-CM | POA: Insufficient documentation

## 2019-05-22 DIAGNOSIS — F17210 Nicotine dependence, cigarettes, uncomplicated: Secondary | ICD-10-CM | POA: Diagnosis not present

## 2019-05-22 DIAGNOSIS — I10 Essential (primary) hypertension: Secondary | ICD-10-CM | POA: Insufficient documentation

## 2019-05-22 HISTORY — PX: LEFT HEART CATH AND CORONARY ANGIOGRAPHY: CATH118249

## 2019-05-22 LAB — CBC
Hematocrit: 42.5 % (ref 34.0–46.6)
Hemoglobin: 14.6 g/dL (ref 11.1–15.9)
MCH: 28.3 pg (ref 26.6–33.0)
MCHC: 34.4 g/dL (ref 31.5–35.7)
MCV: 83 fL (ref 79–97)
Platelets: 167 10*3/uL (ref 150–450)
RBC: 5.15 x10E6/uL (ref 3.77–5.28)
RDW: 12.9 % (ref 11.7–15.4)
WBC: 9.9 10*3/uL (ref 3.4–10.8)

## 2019-05-22 LAB — BASIC METABOLIC PANEL
BUN/Creatinine Ratio: 19 (ref 12–28)
BUN: 12 mg/dL (ref 8–27)
CO2: 23 mmol/L (ref 20–29)
Calcium: 9.5 mg/dL (ref 8.7–10.3)
Chloride: 98 mmol/L (ref 96–106)
Creatinine, Ser: 0.64 mg/dL (ref 0.57–1.00)
GFR calc Af Amer: 109 mL/min/{1.73_m2} (ref >59)
GFR calc non Af Amer: 95 mL/min/{1.73_m2} (ref >59)
Glucose: 266 mg/dL — ABNORMAL HIGH (ref 65–99)
Potassium: 4.9 mmol/L (ref 3.5–5.2)
Sodium: 138 mmol/L (ref 134–144)

## 2019-05-22 LAB — GLUCOSE, CAPILLARY: Glucose-Capillary: 171 mg/dL — ABNORMAL HIGH (ref 70–99)

## 2019-05-22 SURGERY — LEFT HEART CATH AND CORONARY ANGIOGRAPHY
Anesthesia: LOCAL

## 2019-05-22 MED ORDER — HEPARIN (PORCINE) IN NACL 1000-0.9 UT/500ML-% IV SOLN
INTRAVENOUS | Status: DC | PRN
  Administered 2019-05-22 (×2): 500 mL

## 2019-05-22 MED ORDER — MIDAZOLAM HCL 2 MG/2ML IJ SOLN
INTRAMUSCULAR | Status: AC
  Filled 2019-05-22: qty 2

## 2019-05-22 MED ORDER — HEPARIN SODIUM (PORCINE) 1000 UNIT/ML IJ SOLN
INTRAMUSCULAR | Status: AC
  Filled 2019-05-22: qty 1

## 2019-05-22 MED ORDER — HEPARIN (PORCINE) IN NACL 1000-0.9 UT/500ML-% IV SOLN
INTRAVENOUS | Status: AC
  Filled 2019-05-22: qty 1000

## 2019-05-22 MED ORDER — SODIUM CHLORIDE 0.9 % WEIGHT BASED INFUSION: 1.0000 mL/kg/h | INTRAVENOUS | Status: AC

## 2019-05-22 MED ORDER — MIDAZOLAM HCL 2 MG/2ML IJ SOLN
INTRAMUSCULAR | Status: DC | PRN
  Administered 2019-05-22: 2 mg via INTRAVENOUS

## 2019-05-22 MED ORDER — ONDANSETRON HCL 4 MG/2ML IJ SOLN: 4.0000 mg | Freq: Four times a day (QID) | INTRAMUSCULAR | Status: DC | PRN

## 2019-05-22 MED ORDER — OXYCODONE-ACETAMINOPHEN 5-325 MG PO TABS
2.0000 | ORAL_TABLET | Freq: Once | ORAL | Status: AC
  Administered 2019-05-22: 2 via ORAL
  Filled 2019-05-22: qty 2

## 2019-05-22 MED ORDER — ACETAMINOPHEN 325 MG PO TABS: 650.0000 mg | ORAL_TABLET | ORAL | Status: DC | PRN

## 2019-05-22 MED ORDER — LIDOCAINE HCL (PF) 1 % IJ SOLN
INTRAMUSCULAR | Status: AC
  Filled 2019-05-22: qty 30

## 2019-05-22 MED ORDER — ASPIRIN 81 MG PO CHEW
81.0000 mg | CHEWABLE_TABLET | ORAL | Status: AC
  Administered 2019-05-22: 08:00:00 81 mg via ORAL
  Filled 2019-05-22: qty 1

## 2019-05-22 MED ORDER — SODIUM CHLORIDE 0.9% FLUSH: 3.0000 mL | INTRAVENOUS | Status: DC | PRN

## 2019-05-22 MED ORDER — IOHEXOL 350 MG/ML SOLN
INTRAVENOUS | Status: DC | PRN
  Administered 2019-05-22: 50 mL

## 2019-05-22 MED ORDER — HEPARIN SODIUM (PORCINE) 1000 UNIT/ML IJ SOLN
INTRAMUSCULAR | Status: DC | PRN
  Administered 2019-05-22: 5000 [IU] via INTRAVENOUS

## 2019-05-22 MED ORDER — SODIUM CHLORIDE 0.9 % WEIGHT BASED INFUSION
1.0000 mL/kg/h | INTRAVENOUS | Status: DC
  Administered 2019-05-22: 1 mL/kg/h via INTRAVENOUS

## 2019-05-22 MED ORDER — SODIUM CHLORIDE 0.9 % IV SOLN: 250.0000 mL | INTRAVENOUS | Status: DC | PRN

## 2019-05-22 MED ORDER — SODIUM CHLORIDE 0.9% FLUSH: 3.0000 mL | Freq: Two times a day (BID) | INTRAVENOUS | Status: DC

## 2019-05-22 MED ORDER — SODIUM CHLORIDE 0.9 % WEIGHT BASED INFUSION
3.0000 mL/kg/h | INTRAVENOUS | Status: DC
  Administered 2019-05-22: 3 mL/kg/h via INTRAVENOUS

## 2019-05-22 MED ORDER — VERAPAMIL HCL 2.5 MG/ML IV SOLN
INTRAVENOUS | Status: DC | PRN
  Administered 2019-05-22: 6 mL via INTRA_ARTERIAL

## 2019-05-22 MED ORDER — VERAPAMIL HCL 2.5 MG/ML IV SOLN
INTRAVENOUS | Status: AC
  Filled 2019-05-22: qty 2

## 2019-05-22 MED ORDER — FENTANYL CITRATE (PF) 100 MCG/2ML IJ SOLN
INTRAMUSCULAR | Status: DC | PRN
  Administered 2019-05-22: 50 ug via INTRAVENOUS

## 2019-05-22 MED ORDER — FENTANYL CITRATE (PF) 100 MCG/2ML IJ SOLN
INTRAMUSCULAR | Status: AC
  Filled 2019-05-22: qty 2

## 2019-05-22 MED ORDER — FENTANYL CITRATE (PF) 100 MCG/2ML IJ SOLN
50.0000 ug | Freq: Once | INTRAMUSCULAR | Status: AC
  Administered 2019-05-22: 50 ug via INTRAVENOUS
  Filled 2019-05-22: qty 2

## 2019-05-22 MED ORDER — LIDOCAINE HCL (PF) 1 % IJ SOLN
INTRAMUSCULAR | Status: DC | PRN
  Administered 2019-05-22: 2 mL

## 2019-05-22 SURGICAL SUPPLY — 9 items
CATH OPTITORQUE TIG 4.0 5F (CATHETERS) ×1 IMPLANT
DEVICE RAD COMP TR BAND LRG (VASCULAR PRODUCTS) ×1 IMPLANT
GLIDESHEATH SLEND A-KIT 6F 22G (SHEATH) ×1 IMPLANT
GUIDEWIRE INQWIRE 1.5J.035X260 (WIRE) IMPLANT
INQWIRE 1.5J .035X260CM (WIRE) ×2
KIT HEART LEFT (KITS) ×2 IMPLANT
PACK CARDIAC CATHETERIZATION (CUSTOM PROCEDURE TRAY) ×2 IMPLANT
TRANSDUCER W/STOPCOCK (MISCELLANEOUS) ×2 IMPLANT
TUBING CIL FLEX 10 FLL-RA (TUBING) ×2 IMPLANT

## 2019-05-22 NOTE — Interval H&P Note (Signed)
History and Physical Interval Note:  05/22/2019 9:35 AM  Mallory Mendez  has presented today for surgery, with the diagnosis of Angina.  The various methods of treatment have been discussed with the patient and family. After consideration of risks, benefits and other options for treatment, the patient has consented to  Procedure(s): LEFT HEART CATH AND CORONARY ANGIOGRAPHY (N/A) as a surgical intervention.  The patient's history has been reviewed, patient examined, no change in status, stable for surgery.  I have reviewed the patient's chart and labs.  Questions were answered to the patient's satisfaction.   Symptom Status: Ischemic Symptoms Non-invasive Testing: Not done If no or indeterminate stress test, FFR/iFR results in all diseased vessels: Not done Diabetes Mellitus: Yes S/P CABG: No Antianginal therapy (number of long-acting drugs): >=2 Patient undergoing renal transplant: No Patient undergoing percutaneous valve procedure: No   1 Vessel Disease No proximal LAD involvement, No proximal left dominant LCX involvement  PCI: Not rated  CABG: Not rated Proximal left dominant LCX involvement  PCI: Not rated  CABG: Not rated Proximal LAD involvement  PCI: Not rated  CABG: Not rated  2 Vessel Disease No proximal LAD involvement  PCI: Not rated  CABG: Not rated Proximal LAD involvement  PCI: Not rated  CABG: Not rated  3 Vessel Disease Low disease complexity (e.g., focal stenoses, SYNTAX <=22)  PCI: Not rated  CABG: Not rated Intermediate or high disease complexity (e.g., SYNTAX >=23)  PCI: Not rated  CABG: Not rated  Left Main Disease Isolated LMCA disease: ostial or midshaft  PCI: A (7);  Indication 24  CABG: A (9);  Indication 24 Isolated LMCA disease: bifurcation involvement  PCI: M (6);  Indication 25  CABG: A (9);  Indication 25 LMCA ostial or midshaft, concurrent low disease burden multivessel disease (e.g., 1-2 additional focal stenoses, SYNTAX  <=22)  PCI: A (7);  Indication 26  CABG: A (9);  Indication 26 LMCA ostial or midshaft, concurrent intermediate or high disease burden multivessel disease (e.g., 1-2 additional bifurcation stenoses, long stenoses, SYNTAX >=23)  PCI: M (4);  Indication 27  CABG: A (9);  Indication 27 LMCA bifurcation involvement, concurrent low disease burden multivessel disease (e.g., 1-2 additional focal stenoses, SYNTAX <=22)  PCI: M (6);  Indication 28  CABG: A (9);  Indication 28 LMCA bifurcation involvement, concurrent intermediate or high disease burden multivessel disease (e.g., 1-2 additional bifurcation stenoses, long stenoses, SYNTAX >=23)  PCI: R (3);  Indication 29  CABG: A (9);  Indication 29  Notes:  A indicates appropriate. M indicates may be appropriate. R indicates rarely appropriate. Number in parentheses is median score for that indication. Reclassify indicates number of functionally diseased vessels should be decreased given negative FFR/iFR. Re-evaluate the scenario interpreting any FFR/iFR negative vessel as being not significantly stenosed.  Disease means involved vessel provides flow to a sufficient amount of myocardium to be clinically important.  If FFR testing indicates a vessel is not significant, that vessel should not be considered diseased (and the patient should be reclassified with respect to extent of functionally significant disease).  Proximal LAD + proximal left dominant LCX is considered 3 vessel CAD  2 Vessel CAD with FFR/iFR abnormal in only 1 but not both is considered 1 vessel CAD  Disease complexity includes occlusion, bifurcation, trifurcation, ostial, >20 mm, tortuosity, calcification, thrombus  LMCA disease is >=50% by angiography, MLD <2.8 mm, MLA <6 mm2; MLA 6-7.5 mm2 requires further physiologic  See Table B for risk stratification based  on noninvasive testing  Journal of the [employer] of Cardiology Mar 2017, 23391; DOI:  10.1016/j.jacc.2017.02.001 [employer].de.[email or web address].full-text.pdf This App  2018 by the Society for Cardiovascular Angiography and Interventions   Adrian Prows

## 2019-05-22 NOTE — Discharge Instructions (Signed)
Radial Site Care ° °This sheet gives you information about how to care for yourself after your procedure. Your health care provider may also give you more specific instructions. If you have problems or questions, contact your health care provider. °What can I expect after the procedure? °After the procedure, it is common to have: °· Bruising and tenderness at the catheter insertion area. °Follow these instructions at home: °Medicines °· Take over-the-counter and prescription medicines only as told by your health care provider. °Insertion site care °· Follow instructions from your health care provider about how to take care of your insertion site. Make sure you: °? Wash your hands with soap and water before you change your bandage (dressing). If soap and water are not available, use hand sanitizer. °? Change your dressing as told by your health care provider. °? Leave stitches (sutures), skin glue, or adhesive strips in place. These skin closures may need to stay in place for 2 weeks or longer. If adhesive strip edges start to loosen and curl up, you may trim the loose edges. Do not remove adhesive strips completely unless your health care provider tells you to do that. °· Check your insertion site every day for signs of infection. Check for: °? Redness, swelling, or pain. °? Fluid or blood. °? Pus or a bad smell. °? Warmth. °· Do not take baths, swim, or use a hot tub until your health care provider approves. °· You may shower 24-48 hours after the procedure, or as directed by your health care provider. °? Remove the dressing and gently wash the site with plain soap and water. °? Pat the area dry with a clean towel. °? Do not rub the site. That could cause bleeding. °· Do not apply powder or lotion to the site. °Activity ° °· For 24 hours after the procedure, or as directed by your health care provider: °? Do not flex or bend the affected arm. °? Do not push or pull heavy objects with the affected arm. °? Do not  drive yourself home from the hospital or clinic. You may drive 24 hours after the procedure unless your health care provider tells you not to. °? Do not operate machinery or power tools. °· Do not lift anything that is heavier than 10 lb (4.5 kg), or the limit that you are told, until your health care provider says that it is safe. °· Ask your health care provider when it is okay to: °? Return to work or school. °? Resume usual physical activities or sports. °? Resume sexual activity. °General instructions °· If the catheter site starts to bleed, raise your arm and put firm pressure on the site. If the bleeding does not stop, get help right away. This is a medical emergency. °· If you went home on the same day as your procedure, a responsible adult should be with you for the first 24 hours after you arrive home. °· Keep all follow-up visits as told by your health care provider. This is important. °Contact a health care provider if: °· You have a fever. °· You have redness, swelling, or yellow drainage around your insertion site. °Get help right away if: °· You have unusual pain at the radial site. °· The catheter insertion area swells very fast. °· The insertion area is bleeding, and the bleeding does not stop when you hold steady pressure on the area. °· Your arm or hand becomes pale, cool, tingly, or numb. °These symptoms may represent a serious problem   that is an emergency. Do not wait to see if the symptoms will go away. Get medical help right away. Call your local emergency services (911 in the [place] Do not drive yourself to the hospital. °Summary °· After the procedure, it is common to have bruising and tenderness at the site. °· Follow instructions from your health care provider about how to take care of your radial site wound. Check the wound every day for signs of infection. °· Do not lift anything that is heavier than 10 lb (4.5 kg), or the limit that you are told, until your health care provider says  that it is safe. °This information is not intended to replace advice given to you by your health care provider. Make sure you discuss any questions you have with your health care provider. °Document Released: 07/24/2010 Document Revised: 07/27/2017 Document Reviewed: 07/27/2017 °Elsevier Patient Education © 2020 [employer] ° °

## 2019-05-22 NOTE — Progress Notes (Signed)
S/w pt advised her of echo results.

## 2019-05-23 ENCOUNTER — Encounter (HOSPITAL_COMMUNITY): Payer: Self-pay | Admitting: Cardiology

## 2019-05-24 ENCOUNTER — Encounter (HOSPITAL_COMMUNITY): Payer: Self-pay | Admitting: Cardiology

## 2019-05-25 ENCOUNTER — Encounter (HOSPITAL_COMMUNITY): Payer: Self-pay

## 2019-05-25 ENCOUNTER — Other Ambulatory Visit: Payer: Self-pay

## 2019-05-25 ENCOUNTER — Ambulatory Visit (HOSPITAL_COMMUNITY)
Admission: RE | Admit: 2019-05-25 | Discharge: 2019-05-25 | Disposition: A | Discharge status: Home / Self Care | Payer: Medicare HMO | Source: Ambulatory Visit | Attending: Interventional Radiology | Admitting: Interventional Radiology

## 2019-05-25 ENCOUNTER — Ambulatory Visit (HOSPITAL_COMMUNITY): Payer: Medicare HMO

## 2019-05-25 DIAGNOSIS — I671 Cerebral aneurysm, nonruptured: Secondary | ICD-10-CM

## 2019-05-29 ENCOUNTER — Encounter (HOSPITAL_COMMUNITY): Payer: Self-pay | Admitting: Cardiology

## 2019-05-30 DIAGNOSIS — M961 Postlaminectomy syndrome, not elsewhere classified: Secondary | ICD-10-CM | POA: Diagnosis not present

## 2019-05-30 DIAGNOSIS — Z79891 Long term (current) use of opiate analgesic: Secondary | ICD-10-CM | POA: Diagnosis not present

## 2019-05-30 DIAGNOSIS — G894 Chronic pain syndrome: Secondary | ICD-10-CM | POA: Diagnosis not present

## 2019-05-30 DIAGNOSIS — M545 Low back pain: Secondary | ICD-10-CM | POA: Diagnosis not present

## 2019-06-04 ENCOUNTER — Telehealth (HOSPITAL_COMMUNITY): Payer: Self-pay

## 2019-06-04 NOTE — Telephone Encounter (Signed)
Called pt regarding recent mri, no answer. Left vm for pt to f/u in 1 year. Call with questions. AW

## 2019-06-13 ENCOUNTER — Encounter: Payer: Self-pay | Admitting: Cardiology

## 2019-06-13 ENCOUNTER — Ambulatory Visit (INDEPENDENT_AMBULATORY_CARE_PROVIDER_SITE_OTHER): Payer: Medicare HMO | Admitting: Cardiology

## 2019-06-13 ENCOUNTER — Other Ambulatory Visit: Payer: Self-pay

## 2019-06-13 VITALS — BP 181/90 | HR 67 | Temp 98.7°F | Ht 63.0 in | Wt 174.0 lb

## 2019-06-13 DIAGNOSIS — I25118 Atherosclerotic heart disease of native coronary artery with other forms of angina pectoris: Secondary | ICD-10-CM

## 2019-06-13 DIAGNOSIS — F17200 Nicotine dependence, unspecified, uncomplicated: Secondary | ICD-10-CM

## 2019-06-13 DIAGNOSIS — F17210 Nicotine dependence, cigarettes, uncomplicated: Secondary | ICD-10-CM | POA: Diagnosis not present

## 2019-06-13 DIAGNOSIS — E7800 Pure hypercholesterolemia, unspecified: Secondary | ICD-10-CM

## 2019-06-13 DIAGNOSIS — I10 Essential (primary) hypertension: Secondary | ICD-10-CM

## 2019-06-13 DIAGNOSIS — R0609 Other forms of dyspnea: Secondary | ICD-10-CM | POA: Diagnosis not present

## 2019-06-13 MED ORDER — AMLODIPINE BESYLATE 10 MG PO TABS: 10.0000 mg | ORAL_TABLET | Freq: Every day | ORAL | 2 refills | Status: DC

## 2019-06-13 NOTE — Progress Notes (Signed)
Primary Physician:  Shirline Frees, MD   Patient ID: Mallory Mendez, female    DOB: October 22, 1955, 63 y.o.   MRN: [id number]  Subjective:    Chief Complaint  Patient presents with   Coronary Artery Disease   Follow-up    HPI: Mallory Mendez  is a 63 y.o. female  with diabetes, hypertension, hyperlipidemia, history of cerebral aneurysm repair in 2012, continued tobacco abuse, moderate diffuse CAD by coronary angiogram in June 2017, recently seen for angina, in which she underwent repeat coronary angiogram on 05/22/19 that showed slight progression of CAD, but continued to felt to be moderate CAD.  She is doing well post-procedure. She states that since Imdur was increased, her symptoms have improved. She has not had further episodes of angina. Continues to have dyspnea on exertion, but is improved. No complications from right radial access site. She is found to not be on amlodipine.  Unfortunately, she continues to smoke 0.5 pack per day.   Past Medical History:  Diagnosis Date   Anxiety    Arthritis    Blood transfusion    Brain injury ([place])    1 1/2 YR AGO   Chronic constipation    Chronic stomach ulcer    Concussion    HAS HAD DIZZINESS/ MEMORY PROBLEMS/ SLOW SPEACH /BALANCE PROBLEMS SINCE CONCUSSION 1 1/2 YR AGO PR GOES TO THERAPY   Concussion    Depression    Diabetes mellitus    Diabetes mellitus    Dizziness    Fibromyalgia    Full dentures    GERD (gastroesophageal reflux disease)    Headache(784.0)    Hernia    incisional   History of back surgery    multiple   Hyperlipidemia    Hypertension    MRSA (methicillin resistant Staphylococcus aureus)    Nocturia    Paroxysmal vertigo    Scar    Sleep difficulties    Stroke [facility])    crica [id number]   Umbilical hernia    Wears glasses     Past Surgical History:  Procedure Laterality Date   ABDOMINAL HYSTERECTOMY     APPENDECTOMY     BACK SURGERY     SPINAL FUSION X6     CARDIAC CATHETERIZATION N/A 12/23/2015   Procedure: Left Heart Cath and Coronary Angiography;  Surgeon: Adrian Prows, MD;  Location: [place] [facility];  Service: Cardiovascular;  Laterality: N/A;   CEREBRAL ANEURYSM REPAIR     CHOLECYSTECTOMY  1984   EYE SURGERY  2010   bilateral   HERNIA REPAIR     09/21/2010   HERNIA REPAIR  05/19/11   incisional hernia repair    INCISIONAL HERNIA REPAIR  05/19/2011   Procedure: HERNIA REPAIR INCISIONAL;  Surgeon: Willey Blade, MD;  Location: [facility];  Service: General;  Laterality: N/A;  repair incisional hernia with mesh   IR ANGIO INTRA EXTRACRAN SEL COM CAROTID INNOMINATE BILAT MOD SED  05/09/2018   IR ANGIO VERTEBRAL SEL VERTEBRAL BILAT MOD SED  05/09/2018   IR GENERIC HISTORICAL  09/30/2016   IR ANGIO VERTEBRAL SEL VERTEBRAL BILAT MOD SED 09/30/2016 Luanne Bras, MD [facility]   IR GENERIC HISTORICAL  09/30/2016   IR ANGIO INTRA EXTRACRAN SEL COM CAROTID INNOMINATE BILAT MOD SED 09/30/2016 Luanne Bras, MD [facility]   LEFT HEART CATH AND CORONARY ANGIOGRAPHY N/A 05/22/2019   Procedure: LEFT HEART CATH AND CORONARY ANGIOGRAPHY;  Surgeon: Adrian Prows, MD;  Location: [place] [facility];  Service:  Cardiovascular;  Laterality: N/A;   LEFT HEART CATHETERIZATION WITH CORONARY ANGIOGRAM N/A 11/05/2013   Procedure: LEFT HEART CATHETERIZATION WITH CORONARY ANGIOGRAM;  Surgeon: Laverda Page, MD;  Location[facility] C[facility];  Service: Cardiovascular;  Laterality: N/A;   SPINAL FUSION  2006   TUBAL LIGATION      Social History   Socioeconomic History   Marital status: Widowed    Spouse name: Not on file   Number of children: 5   Years of education: Not on file   Highest education level: Not on file  Occupational History   Occupation: disabled  Tobacco Use   Smoking status: Current Every Day Smoker    Packs/day: 0.50    Years: 38.00    Pack years: 19.00    Types: Cigarettes   Smokeless tobacco: Never Used   Substance and Sexual Activity   Alcohol use: No   Drug use: No   Sexual activity: Not on file  Other Topics Concern   Not on file  Social History Narrative   Not on file   Social Determinants of Health   Financial Resource Strain:    Difficulty of Paying Living Expenses: Not on file  Food Insecurity:    Worried About [place] in the Last Year: Not on file   Ran Out of Food in the Last Year: Not on file  Transportation Needs:    Lack of Transportation (Medical): Not on file   Lack of Transportation (Non-Medical): Not on file  Physical Activity:    Days of Exercise per Week: Not on file   Minutes of Exercise per Session: Not on file  Stress:    Feeling of Stress : Not on file  Social Connections:    Frequency of Communication with Friends and Family: Not on file   Frequency of Social Gatherings with Friends and Family: Not on file   Attends Religious Services: Not on file   Active Member of Clubs or Organizations: Not on file   Attends Archivist Meetings: Not on file   Marital Status: Not on file  Intimate Partner Violence:    Fear of Current or Ex-Partner: Not on file   Emotionally Abused: Not on file   Physically Abused: Not on file   Sexually Abused: Not on file    Review of Systems  Constitution: Negative for decreased appetite, malaise/fatigue, weight gain and weight loss.  Eyes: Negative for visual disturbance.  Cardiovascular: Positive for chest pain, dyspnea on exertion and leg swelling. Negative for claudication, orthopnea, palpitations and syncope.  Respiratory: Negative for hemoptysis and wheezing.   Endocrine: Negative for cold intolerance and heat intolerance.  Hematologic/Lymphatic: Does not bruise/bleed easily.  Skin: Negative for nail changes.  Musculoskeletal: Positive for back pain. Negative for muscle weakness and myalgias.  Gastrointestinal: Negative for abdominal pain, change in bowel habit, nausea and  vomiting.  Neurological: Negative for difficulty with concentration, dizziness, focal weakness and headaches.  Psychiatric/Behavioral: Negative for altered mental status and suicidal ideas.  All other systems reviewed and are negative.     Objective:  Blood pressure (!) 181/90, pulse 67, temperature 98.7 F (37.1 C), height 5\' 3"  (1.6 m), weight 174 lb (78.9 kg), SpO2 96 %. Body mass index is 30.82 kg/m.     Physical Exam  Constitutional: She is oriented to person, place, and time. Vital signs are normal. She appears well-developed and well-nourished.  HENT:  Head: Normocephalic and atraumatic.  Neck: Normal range of motion.  Cardiovascular: Normal  rate, regular rhythm, normal heart sounds and intact distal pulses.  Pulses:      Carotid pulses are on the right side with bruit and on the left side with bruit. Pulmonary/Chest: Effort normal and breath sounds normal. No accessory muscle usage. No respiratory distress.  Abdominal: Soft. Bowel sounds are normal.  Musculoskeletal: Normal range of motion.  Neurological: She is alert and oriented to person, place, and time.  Skin: Skin is warm and dry.  Vitals reviewed.  Radiology: No results found.  Laboratory examination:   Labs 12/17/2016: Total cholesterol 156, triglycerides 114, HDL 48, LDL 85  CMP Latest Ref Rng & Units 05/21/2019 05/09/2018 10/20/2017  Glucose 65 - 99 mg/dL 266(H) 166(H) -  BUN 8 - 27 mg/dL 12 14 -  Creatinine 0.57 - 1.00 mg/dL 0.64 0.58 0.55  Sodium 134 - 144 mmol/L 138 135 -  Potassium 3.5 - 5.2 mmol/L 4.9 3.7 -  Chloride 96 - 106 mmol/L 98 104 -  CO2 20 - 29 mmol/L 23 24 -  Calcium 8.7 - 10.3 mg/dL 9.5 8.9 -  Total Protein 6.0 - 8.3 g/dL - - -  Total Bilirubin 0.3 - 1.2 mg/dL - - -  Alkaline Phos 39 - 117 U/L - - -  AST 0 - 37 U/L - - -  ALT 0 - 35 U/L - - -   CBC Latest Ref Rng & Units 05/21/2019 05/09/2018 09/30/2016  WBC 3.4 - 10.8 x10E3/uL 9.9 8.2 12.7(H)  Hemoglobin 11.1 - 15.9 g/dL 14.6 12.6 13.9   Hematocrit 34.0 - 46.6 % 42.5 37.7 41.8  Platelets 150 - 450 x10E3/uL 167 174 189   Lipid Panel     Component Value Date/Time   CHOL 184 10/30/2013 0057   TRIG 244 (H) 10/30/2013 0057   HDL 40 10/30/2013 0057   CHOLHDL 4.6 10/30/2013 0057   VLDL 49 (H) 10/30/2013 0057   LDLCALC 95 10/30/2013 0057   HEMOGLOBIN A1C No results found for: HGBA1C, MPG TSH No results for input(s): TSH in the last 8760 hours.  PRN Meds:. Medications Discontinued During This Encounter  Medication Reason   gabapentin (NEURONTIN) 800 MG tablet Error   amLODipine (NORVASC) 10 MG tablet Reorder   losartan (COZAAR) 100 MG tablet Discontinued by provider   Current Meds  Medication Sig   ALPRAZolam (XANAX) 1 MG tablet Take 1 mg by mouth 3 (three) times daily as needed for anxiety.    aspirin 81 MG chewable tablet Chew 81 mg by mouth daily.   atorvastatin (LIPITOR) 40 MG tablet Take 40 mg by mouth daily.   baclofen (LIORESAL) 10 MG tablet Take 10 mg by mouth daily.    buPROPion (WELLBUTRIN XL) 150 MG 24 hr tablet Take 150 mg by mouth daily.   esomeprazole (NEXIUM) 40 MG capsule Take 40 mg by mouth every evening.    estradiol (ESTRACE) 1 MG tablet Take 1 mg by mouth daily.   insulin aspart (NOVOLOG FLEXPEN) 100 UNIT/ML FlexPen Inject 8-12 Units into the skin 3 (three) times daily with meals. Sliding scale   insulin glargine (LANTUS) 100 UNIT/ML injection Inject 10-50 Units into the skin 2 (two) times daily. 50 units in the morning, and 10 units at bedtime   isosorbide mononitrate (IMDUR) 30 MG 24 hr tablet Take 1 tablet (30 mg total) by mouth daily.   levocetirizine (XYZAL) 5 MG tablet Take 5 mg by mouth every evening.   metoprolol succinate (TOPROL-XL) 50 MG 24 hr tablet Take 50 mg by mouth daily.  Take with or immediately following a meal.   nitroGLYCERIN (NITROSTAT) 0.4 MG SL tablet Place 0.4 mg under the tongue every 5 (five) minutes as needed for chest pain.   oxybutynin (DITROPAN) 5 MG  tablet Take 5 mg by mouth daily.   oxycodone (ROXICODONE) 30 MG immediate release tablet Take 30 mg by mouth every 12 (twelve) hours as needed for pain.   oxyCODONE-acetaminophen (PERCOCET) 10-325 MG per tablet Take 1 tablet by mouth 3 (three) times daily.    torsemide (DEMADEX) 20 MG tablet Take 20 mg by mouth daily.   zolpidem (AMBIEN) 10 MG tablet Take 10 mg by mouth at bedtime as needed for sleep.   Medications Discontinued During This Encounter  Medication Reason   gabapentin (NEURONTIN) 800 MG tablet Error   amLODipine (NORVASC) 10 MG tablet Reorder   losartan (COZAAR) 100 MG tablet Discontinued by provider     Cardiac Studies:   Carotid artery duplex 04-01-2017: No hemodynamically significant arterial disease in the internal carotid artery bilaterally. Antegrade right vertebral artery flow. Antegrade left vertebral artery flow.  Coronary angiogram 12/23/2015: LVEF 55%. Mid Cx 60% and a tandem 50% stenosis, hazy, soft plaque, unchanged from 2015. Prox LAD diffuse long 20-30% stenosis, mid to distal LAD small and mildly tortuous and ends at the apex. Large RCA, ostial 20%, PDA branch 50% stenosis.  Nuclear stress test 12/05/2015: 1. The resting electrocardiogram demonstrated normal sinus rhythm, normal resting conduction and no resting arrhythmias.  The stress electrocardiogram was abnormal.  The stress electrocardiogram revealed 2 mm of horizontal ST depression in lead (s):II, III, aVF, V5, V6 consistent with myocardial ischemia.  The ST segments were back to baseline immediately into recovery however there was reappearance of ST depression at 3 minutes into recovery.  There was also occasional PVCs.  Stress symptoms included dyspnea.  Patient exercised on Bruce protocol for 5.0  minutes and achieved 4.64 METS. Stress test terminated due to target heart rate( 1.1% MPHR) and dyspnea.  2.  Left ventricular cavity is noted to be enlarged on the stress study.  The TID index was 1.41.  SPECT images demonstrate homogeneous tracer distribution throughout the myocardium.  Gated SPECT images reveal normal myocardial thickening and wall motion.  The left ventricular ejection fraction was calculated or visually estimated to be 63%.  This is an intermediate risk study, clinical correlation recommended. This is a high  risk study in view of abnormal EKG response and transient ischemic dilatation of LV noted on stress images.  clinical correlation recommended.  Assessment:   Atherosclerosis of native coronary artery of native heart with stable angina pectoris ([place])  Primary hypertension  Dyspnea on exertion  Hyperlipidemia, group A  Tobacco use disorder  EKG 05/17/2019: Normal sinus rhythm at 60 bpm, normal axis, no evidence of ischemia.  Recommendations:   I have discussed coronary angiogram findings with the patient, she has moderate CAD will need continued aggressive risk factor modification.  She states her symptoms of angina have improved.  Her blood pressure continues to be markedly elevated and she is found to not be on amlodipine.  I will start her on amlodipine 10 mg daily both for hypertension and angina.  If she continues to have any angina symptoms, will consider alternate or Ranexa.  She will need aggressive lipid control and is to see her PCP on 12/19.  I will request lab results to be sent to [place], may need to further increase her Lipitor.  I have encouraged her to start regular  exercise as tolerated.  Lengthy discussion with the patient regarding the importance of complete smoking cessation and further reducing her risk.  She is planning to quit after the first of the year.  I will plan to see her back in 4 weeks to reevaluate her hypertension and also with her compliance with smoking cessation.  [name], MSN, APRN, FNP-C [facility] Cardi[facility]. [place] Office: [phone] Fax: [phone]

## 2019-06-13 NOTE — Patient Instructions (Addendum)
Start Amlodipine 10 mg daily in the morning  Please have Dr. Kenton Kingfisher collect cholesterol levels and send to me

## 2019-06-15 ENCOUNTER — Ambulatory Visit: Payer: Medicare HMO

## 2019-06-17 ENCOUNTER — Encounter: Payer: Self-pay | Admitting: Cardiology

## 2019-06-22 ENCOUNTER — Other Ambulatory Visit: Payer: Self-pay

## 2019-06-27 DIAGNOSIS — G894 Chronic pain syndrome: Secondary | ICD-10-CM | POA: Diagnosis not present

## 2019-06-27 DIAGNOSIS — M545 Low back pain: Secondary | ICD-10-CM | POA: Diagnosis not present

## 2019-06-27 DIAGNOSIS — Z79891 Long term (current) use of opiate analgesic: Secondary | ICD-10-CM | POA: Diagnosis not present

## 2019-06-27 DIAGNOSIS — M961 Postlaminectomy syndrome, not elsewhere classified: Secondary | ICD-10-CM | POA: Diagnosis not present

## 2019-07-03 ENCOUNTER — Telehealth: Payer: Self-pay

## 2019-07-11 ENCOUNTER — Ambulatory Visit (INDEPENDENT_AMBULATORY_CARE_PROVIDER_SITE_OTHER): Payer: Medicare Other | Admitting: Cardiology

## 2019-07-11 ENCOUNTER — Other Ambulatory Visit: Payer: Self-pay

## 2019-07-11 ENCOUNTER — Encounter: Payer: Self-pay | Admitting: Cardiology

## 2019-07-11 VITALS — BP 160/90 | HR 79 | Temp 98.0°F | Ht 63.0 in | Wt 172.5 lb

## 2019-07-11 DIAGNOSIS — F17200 Nicotine dependence, unspecified, uncomplicated: Secondary | ICD-10-CM

## 2019-07-11 DIAGNOSIS — E7800 Pure hypercholesterolemia, unspecified: Secondary | ICD-10-CM | POA: Diagnosis not present

## 2019-07-11 DIAGNOSIS — I10 Essential (primary) hypertension: Secondary | ICD-10-CM | POA: Diagnosis not present

## 2019-07-11 DIAGNOSIS — F17210 Nicotine dependence, cigarettes, uncomplicated: Secondary | ICD-10-CM | POA: Diagnosis not present

## 2019-07-11 DIAGNOSIS — I25118 Atherosclerotic heart disease of native coronary artery with other forms of angina pectoris: Secondary | ICD-10-CM | POA: Diagnosis not present

## 2019-07-11 MED ORDER — SPIRONOLACTONE-HCTZ 25-25 MG PO TABS: 1.0000 | ORAL_TABLET | Freq: Every day | ORAL | 1 refills | Status: DC

## 2019-07-11 NOTE — Progress Notes (Signed)
Primary Physician:  Shirline Frees, MD   Patient ID: Mallory Mendez, female    DOB: 1956/02/17, 64 y.o.   MRN: [id number]  Subjective:    Chief Complaint  Patient presents with  . Coronary Artery Disease  . Hypertension  . Follow-up    4 week    HPI: Mallory Mendez  is a 64 y.o. female  with diabetes, hypertension, hyperlipidemia, history of cerebral aneurysm repair in 2012, continued tobacco abuse, moderate diffuse CAD by coronary angiogram in June 2017, recently seen for angina, in which she underwent repeat coronary angiogram on 05/22/19 that showed slight progression of CAD, but continued to felt to be moderate CAD.  She is doing well without any complaints.  Has not had any symptoms of chest pain or worsening dyspnea on exertion.  She is tolerating amlodipine well.  Significantly cut back to 3 cigarettes per day!  Past Medical History:  Diagnosis Date  . Anxiety   . Arthritis   . Blood transfusion   . Brain injury ([place])    1 1/2 YR AGO  . Chronic constipation   . Chronic stomach ulcer   . Concussion    HAS HAD DIZZINESS/ MEMORY PROBLEMS/ SLOW SPEACH /BALANCE PROBLEMS SINCE CONCUSSION 1 1/2 YR AGO PR GOES TO THERAPY  . Concussion   . Depression   . Diabetes mellitus   . Diabetes mellitus   . Dizziness   . Fibromyalgia   . Full dentures   . GERD (gastroesophageal reflux disease)   . Headache(784.0)   . Hernia    incisional  . History of back surgery    multiple  . Hyperlipidemia   . Hypertension   . MRSA (methicillin resistant Staphylococcus aureus)   . Nocturia   . Paroxysmal vertigo   . Scar   . Sleep difficulties   . Stroke ([place])    crica 2002  . Umbilical hernia   . Wears glasses     Past Surgical History:  Procedure Laterality Date  . ABDOMINAL HYSTERECTOMY    . APPENDECTOMY    . BACK SURGERY     SPINAL FUSION X6   . CARDIAC CATHETERIZATION N/A 12/23/2015   Procedure: Left Heart Cath and Coronary Angiography;  Surgeon: Adrian Prows, MD;   Location: [facility];  Service: Cardiovascular;  Laterality: N/A;  . CEREBRAL ANEURYSM REPAIR    . CHOLECYSTECTOMY  1984  . EYE SURGERY  2010   bilateral  . HERNIA REPAIR     09/21/2010  . HERNIA REPAIR  05/19/11   incisional hernia repair   . INCISIONAL HERNIA REPAIR  05/19/2011   Procedure: HERNIA REPAIR INCISIONAL;  Surgeon: Willey Blade, MD;  Location: [facility];  Service: General;  Laterality: N/A;  repair incisional hernia with mesh  . IR ANGIO INTRA EXTRACRAN SEL COM CAROTID INNOMINATE BILAT MOD SED  05/09/2018  . IR ANGIO VERTEBRAL SEL VERTEBRAL BILAT MOD SED  05/09/2018  . IR GENERIC HISTORICAL  09/30/2016   IR ANGIO VERTEBRAL SEL VERTEBRAL BILAT MOD SED 09/30/2016 Luanne Bras, MD [facility]  . IR GENERIC HISTORICAL  09/30/2016   IR ANGIO INTRA EXTRACRAN SEL COM CAROTID INNOMINATE BILAT MOD SED 09/30/2016 Luanne Bras, MD [facility]  . LEFT HEART CATH AND CORONARY ANGIOGRAPHY N/A 05/22/2019   Procedure: LEFT HEART CATH AND CORONARY ANGIOGRAPHY;  Surgeon: Adrian Prows, MD;  Location: [facility];  Service: Cardiovascular;  Laterality: N/A;  . LEFT HEART CATHETERIZATION WITH CORONARY ANGIOGRAM N/A 11/05/2013  Procedure: LEFT HEART CATHETERIZATION WITH CORONARY ANGIOGRAM;  Surgeon: Laverda Page, MD;  Location: [facility];  Service: Cardiovascular;  Laterality: N/A;  . SPINAL FUSION  2006  . TUBAL LIGATION      Social History   Socioeconomic History  . Marital status: Widowed    Spouse name: Not on file  . Number of children: 5  . Years of education: Not on file  . Highest education level: Not on file  Occupational History  . Occupation: disabled  Tobacco Use  . Smoking status: Current Every Day Smoker    Packs/day: 0.50    Years: 38.00    Pack years: 19.00    Types: Cigarettes  . Smokeless tobacco: Never Used  Substance and Sexual Activity  . Alcohol use: No  . Drug use: No  . Sexual activity: Not on file  Other Topics Concern  .  Not on file  Social History Narrative  . Not on file   Social Determinants of Health   Financial Resource Strain:   . Difficulty of Paying Living Expenses: Not on file  Food Insecurity:   . Worried About Charity fundraiser in the Last Year: Not on file  . Ran Out of Food in the Last Year: Not on file  Transportation Needs:   . Lack of Transportation (Medical): Not on file  . Lack of Transportation (Non-Medical): Not on file  Physical Activity:   . Days of Exercise per Week: Not on file  . Minutes of Exercise per Session: Not on file  Stress:   . Feeling of Stress : Not on file  Social Connections:   . Frequency of Communication with Friends and Family: Not on file  . Frequency of Social Gatherings with Friends and Family: Not on file  . Attends Religious Services: Not on file  . Active Member of Clubs or Organizations: Not on file  . Attends Archivist Meetings: Not on file  . Marital Status: Not on file  Intimate Partner Violence:   . Fear of Current or Ex-Partner: Not on file  . Emotionally Abused: Not on file  . Physically Abused: Not on file  . Sexually Abused: Not on file    Review of Systems  Constitution: Negative for decreased appetite, malaise/fatigue, weight gain and weight loss.  Eyes: Negative for visual disturbance.  Cardiovascular: Positive for chest pain, dyspnea on exertion and leg swelling. Negative for claudication, orthopnea, palpitations and syncope.  Respiratory: Negative for hemoptysis and wheezing.   Endocrine: Negative for cold intolerance and heat intolerance.  Hematologic/Lymphatic: Does not bruise/bleed easily.  Skin: Negative for nail changes.  Musculoskeletal: Positive for back pain. Negative for muscle weakness and myalgias.  Gastrointestinal: Negative for abdominal pain, change in bowel habit, nausea and vomiting.  Neurological: Negative for difficulty with concentration, dizziness, focal weakness and headaches.    Psychiatric/Behavioral: Negative for altered mental status and suicidal ideas.  All other systems reviewed and are negative.     Objective:   Vitals with BMI 07/11/2019 07/11/2019 07/11/2019  Height - - 5\' 3"   Weight - - 172 lbs 8 oz  BMI - - 123456  Systolic 0000000 [id number] 0000000  Diastolic 90 90 92  Pulse - 79 73       Physical Exam  Constitutional: She is oriented to person, place, and time. Vital signs are normal. She appears well-developed and well-nourished.  HENT:  Head: Normocephalic and atraumatic.  Cardiovascular: Normal rate, regular rhythm, normal heart sounds and intact  distal pulses.  Pulses:      Carotid pulses are on the right side with bruit and on the left side with bruit. Pulmonary/Chest: Effort normal and breath sounds normal. No accessory muscle usage. No respiratory distress.  Abdominal: Soft. Bowel sounds are normal.  Musculoskeletal:        General: Normal range of motion.     Cervical back: Normal range of motion.  Neurological: She is alert and oriented to person, place, and time.  Skin: Skin is warm and dry.  Vitals reviewed.  Radiology: No results found.  Laboratory examination:   Labs 12/17/2016: Total cholesterol 156, triglycerides 114, HDL 48, LDL 85  CMP Latest Ref Rng & Units 05/21/2019 05/09/2018 10/20/2017  Glucose 65 - 99 mg/dL 266(H) 166(H) -  BUN 8 - 27 mg/dL 12 14 -  Creatinine 0.57 - 1.00 mg/dL 0.64 0.58 0.55  Sodium 134 - 144 mmol/L 138 135 -  Potassium 3.5 - 5.2 mmol/L 4.9 3.7 -  Chloride 96 - 106 mmol/L 98 104 -  CO2 20 - 29 mmol/L 23 24 -  Calcium 8.7 - 10.3 mg/dL 9.5 8.9 -  Total Protein 6.0 - 8.3 g/dL - - -  Total Bilirubin 0.3 - 1.2 mg/dL - - -  Alkaline Phos 39 - 117 U/L - - -  AST 0 - 37 U/L - - -  ALT 0 - 35 U/L - - -   CBC Latest Ref Rng & Units 05/21/2019 05/09/2018 09/30/2016  WBC 3.4 - 10.8 x10E3/uL 9.9 8.2 12.7(H)  Hemoglobin 11.1 - 15.9 g/dL 14.6 12.6 13.9  Hematocrit 34.0 - 46.6 % 42.5 37.7 41.8  Platelets 150 - 450  x10E3/uL 167 174 189   Lipid Panel     Component Value Date/Time   CHOL 184 10/30/2013 0057   TRIG 244 (H) 10/30/2013 0057   HDL 40 10/30/2013 0057   CHOLHDL 4.6 10/30/2013 0057   VLDL 49 (H) 10/30/2013 0057   LDLCALC 95 10/30/2013 0057   HEMOGLOBIN A1C No results found for: HGBA1C, MPG TSH No results for input(s): TSH in the last 8760 hours.  PRN Meds:. Medications Discontinued During This Encounter  Medication Reason  . torsemide (DEMADEX) 20 MG tablet Discontinued by provider   Current Meds  Medication Sig  . ALPRAZolam (XANAX) 1 MG tablet Take 1 mg by mouth 3 (three) times daily as needed for anxiety.   Marland Kitchen amLODipine (NORVASC) 10 MG tablet Take 1 tablet (10 mg total) by mouth daily.  Marland Kitchen aspirin 81 MG chewable tablet Chew 81 mg by mouth daily.  Marland Kitchen atorvastatin (LIPITOR) 40 MG tablet Take 40 mg by mouth daily.  . baclofen (LIORESAL) 10 MG tablet Take 10 mg by mouth daily.   Marland Kitchen buPROPion (WELLBUTRIN XL) 150 MG 24 hr tablet Take 150 mg by mouth daily.  Marland Kitchen esomeprazole (NEXIUM) 40 MG capsule Take 40 mg by mouth every evening.   Marland Kitchen estradiol (ESTRACE) 1 MG tablet Take 1 mg by mouth daily.  . insulin aspart (NOVOLOG FLEXPEN) 100 UNIT/ML FlexPen Inject 8-12 Units into the skin 3 (three) times daily with meals. Sliding scale  . insulin glargine (LANTUS) 100 UNIT/ML injection Inject 10-50 Units into the skin 2 (two) times daily. 50 units in the morning, and 10 units at bedtime  . isosorbide mononitrate (IMDUR) 30 MG 24 hr tablet Take 1 tablet (30 mg total) by mouth daily.  Marland Kitchen levocetirizine (XYZAL) 5 MG tablet Take 5 mg by mouth every evening.  . metoprolol succinate (TOPROL-XL) 50  MG 24 hr tablet Take 50 mg by mouth daily. Take with or immediately following a meal.  . nitroGLYCERIN (NITROSTAT) 0.4 MG SL tablet Place 0.4 mg under the tongue every 5 (five) minutes as needed for chest pain.  Marland Kitchen oxybutynin (DITROPAN) 5 MG tablet Take 5 mg by mouth daily.  Marland Kitchen oxycodone (ROXICODONE) 30 MG  immediate release tablet Take 30 mg by mouth every 12 (twelve) hours as needed for pain.  Marland Kitchen oxyCODONE ER (XTAMPZA ER) 27 MG C12A Take 1 tablet by mouth 2 (two) times daily.  Marland Kitchen oxyCODONE-acetaminophen (PERCOCET) 10-325 MG per tablet Take 1 tablet by mouth 3 (three) times daily.   Marland Kitchen trimipramine (SURMONTIL) 100 MG capsule Take 100 mg by mouth at bedtime.  . valsartan (DIOVAN) 320 MG tablet Take 320 mg by mouth daily.  Marland Kitchen zolpidem (AMBIEN) 10 MG tablet Take 10 mg by mouth at bedtime as needed for sleep.  . [DISCONTINUED] torsemide (DEMADEX) 20 MG tablet Take 20 mg by mouth daily.   Medications Discontinued During This Encounter  Medication Reason  . torsemide (DEMADEX) 20 MG tablet Discontinued by provider     Cardiac Studies:   Cardiac catheterization 05/22/2019: Normal LV systolic function, normal LVEDP.  No wall motion abnormality. Left main: Distal left main shows about a 30% stenosis. LAD mild to moderate diffusely diseased vessel, in the proximal segment diffuse 20 to 30% stenosis.  Large D1, D2 and D3, mild ostial disease. Circumflex: High origin of OM1 from the proximal segment, bifurcation and proximal segment has a 40 to 50% stenosis followed by tandem mid 40 to 50% stenosis. RCA: PDA has a 40 to 50% stenosis, large vessel.  Carotid artery duplex 03/02/2017: No hemodynamically significant arterial disease in the internal carotid artery bilaterally. Antegrade right vertebral artery flow. Antegrade left vertebral artery flow.  Nuclear stress test 12/05/2015: 1. The resting electrocardiogram demonstrated normal sinus rhythm, normal resting conduction and no resting arrhythmias.  The stress electrocardiogram was abnormal.  The stress electrocardiogram revealed 2 mm of horizontal ST depression in lead (s):II, III, aVF, V5, V6 consistent with myocardial ischemia.  The ST segments were back to baseline immediately into recovery however there was reappearance of ST depression at 3 minutes  into recovery.  There was also occasional PVCs.  Stress symptoms included dyspnea.  Patient exercised on Bruce protocol for 5.0  minutes and achieved 4.64 METS. Stress test terminated due to target heart rate( 1.1% MPHR) and dyspnea.  2.  Left ventricular cavity is noted to be enlarged on the stress study.  The TID index was 1.41. SPECT images demonstrate homogeneous tracer distribution throughout the myocardium.  Gated SPECT images reveal normal myocardial thickening and wall motion.  The left ventricular ejection fraction was calculated or visually estimated to be 63%.  This is an intermediate risk study, clinical correlation recommended. This is a high  risk study in view of abnormal EKG response and transient ischemic dilatation of LV noted on stress images.  clinical correlation recommended.  Assessment:   Primary hypertension  Atherosclerosis of native coronary artery of native heart with stable angina pectoris ([place]) - Plan: Comprehensive Metabolic Panel (CMET)  Hyperlipidemia, group A - Plan: Lipid Profile  Tobacco use disorder  EKG 05/17/2019: Normal sinus rhythm at 60 bpm, normal axis, no evidence of ischemia.  Recommendations:   Blood pressure continues to be elevated despite addition of amlodipine.  I will stop torsemide and add Aldactazide 25-25 mg daily.  Will check BMP in 10 days for surveillance of  kidney function.   Fortunately she has not had any recurrence of angina.  She will need continued aggressive medical management in view of moderate CAD.  She has not had recent lipid panel, will obtain lipids along with her BMP in the next few weeks.  I congratulated her on significantly cutting back on cigarette use. Reinforced the importance of complete smoking cessation. She appears motivated to completely quit and is hoping to do so before she sees me back for her next appointment.  I will see her back in 4 weeks for follow-up, but encouraged her to contact me sooner if  needed.  [name], MSN, APRN, FNP-C [facility]. [place] Office: [phone] Fax: [phone]

## 2019-07-13 ENCOUNTER — Encounter: Payer: Self-pay | Admitting: Cardiology

## 2019-07-18 ENCOUNTER — Other Ambulatory Visit: Payer: Self-pay

## 2019-07-18 MED ORDER — ISOSORBIDE MONONITRATE ER 30 MG PO TB24: 30.0000 mg | ORAL_TABLET | Freq: Every day | ORAL | 1 refills | Status: DC

## 2019-07-31 LAB — COMPREHENSIVE METABOLIC PANEL
ALT: 30 IU/L (ref 0–32)
AST: 30 IU/L (ref 0–40)
Albumin/Globulin Ratio: 1.6 (ref 1.2–2.2)
Albumin: 4.3 g/dL (ref 3.8–4.8)
Alkaline Phosphatase: 151 IU/L — ABNORMAL HIGH (ref 39–117)
BUN/Creatinine Ratio: 12 (ref 12–28)
BUN: 7 mg/dL — ABNORMAL LOW (ref 8–27)
Bilirubin Total: 0.3 mg/dL (ref 0.0–1.2)
CO2: 22 mmol/L (ref 20–29)
Calcium: 9.1 mg/dL (ref 8.7–10.3)
Chloride: 103 mmol/L (ref 96–106)
Creatinine, Ser: 0.59 mg/dL (ref 0.57–1.00)
GFR calc Af Amer: 111 mL/min/{1.73_m2} (ref >59)
GFR calc non Af Amer: 96 mL/min/{1.73_m2} (ref >59)
Globulin, Total: 2.7 g/dL (ref 1.5–4.5)
Glucose: 217 mg/dL — ABNORMAL HIGH (ref 65–99)
Potassium: 5.1 mmol/L (ref 3.5–5.2)
Sodium: 142 mmol/L (ref 134–144)
Total Protein: 7 g/dL (ref 6.0–8.5)

## 2019-07-31 LAB — LIPID PANEL
Chol/HDL Ratio: 4 ratio (ref 0.0–4.4)
Cholesterol, Total: 162 mg/dL (ref 100–199)
HDL: 41 mg/dL (ref >39)
LDL Chol Calc (NIH): 91 mg/dL (ref 0–99)
Triglycerides: 175 mg/dL — ABNORMAL HIGH (ref 0–149)
VLDL Cholesterol Cal: 30 mg/dL (ref 5–40)

## 2019-08-07 ENCOUNTER — Ambulatory Visit: Payer: Medicare Other | Admitting: Cardiology

## 2019-08-29 ENCOUNTER — Other Ambulatory Visit: Payer: Self-pay | Admitting: Cardiology

## 2019-09-04 ENCOUNTER — Ambulatory Visit
Admission: RE | Admit: 2019-09-04 | Discharge: 2019-09-04 | Disposition: A | Discharge status: Home / Self Care | Payer: Medicare Other | Source: Ambulatory Visit | Attending: Family Medicine | Admitting: Family Medicine

## 2019-09-04 ENCOUNTER — Other Ambulatory Visit: Payer: Self-pay

## 2019-09-04 DIAGNOSIS — Z1231 Encounter for screening mammogram for malignant neoplasm of breast: Secondary | ICD-10-CM

## 2019-09-30 ENCOUNTER — Other Ambulatory Visit: Payer: Self-pay | Admitting: Cardiology

## 2019-10-25 ENCOUNTER — Other Ambulatory Visit: Payer: Self-pay

## 2019-10-25 MED ORDER — ISOSORBIDE MONONITRATE ER 30 MG PO TB24: 30.0000 mg | ORAL_TABLET | Freq: Every day | ORAL | 1 refills | Status: AC

## 2019-10-25 MED ORDER — VALSARTAN 320 MG PO TABS: 320.0000 mg | ORAL_TABLET | Freq: Every day | ORAL | 1 refills | Status: DC

## 2019-10-25 MED ORDER — ATORVASTATIN CALCIUM 40 MG PO TABS: 40.0000 mg | ORAL_TABLET | Freq: Every day | ORAL | 1 refills | Status: AC

## 2019-10-25 MED ORDER — SPIRONOLACTONE-HCTZ 25-25 MG PO TABS: 1.0000 | ORAL_TABLET | Freq: Every day | ORAL | 1 refills | Status: DC

## 2019-10-25 MED ORDER — AMLODIPINE BESYLATE 10 MG PO TABS: 10.0000 mg | ORAL_TABLET | Freq: Every day | ORAL | 1 refills | Status: AC

## 2019-10-25 MED ORDER — METOPROLOL SUCCINATE ER 50 MG PO TB24: 50.0000 mg | ORAL_TABLET | Freq: Every day | ORAL | 1 refills | Status: DC

## 2019-12-05 ENCOUNTER — Encounter: Payer: Medicare Other | Attending: Endocrinology | Admitting: Nutrition

## 2019-12-05 ENCOUNTER — Other Ambulatory Visit: Payer: Self-pay

## 2019-12-05 DIAGNOSIS — E1065 Type 1 diabetes mellitus with hyperglycemia: Secondary | ICD-10-CM | POA: Insufficient documentation

## 2019-12-05 NOTE — Progress Notes (Signed)
Patient was identified by name and DOB.  She is wanting to go on an insulin pump.  We discussed the advantages and disadvantage of insulin pump therapy, and what is required to be on a pump.  She was shown the 3 different pumps and we discussed each model and CGM.  She was given brochures to review and told to go onto their web sites to learn more.   We also discussed the need to carb count and she agreed to learn this.  We discussed what carbs were, and what constituted 15 gram portions of all carbs.  She was given a list of all carbs and 2 brochures and 2 handouts of 15 gram portions of all carbohydrates.  She realized that she had been eating "so many more carbs that she thought", and agreed to read over and study all of this.   She is eager to get her blood sugars down, and to start a pump, but has not decided which model she wants to get.  She was told that once she decides, she will need to fill out paperwork or go on line to the web site with insurance information and start the ordering process.   She had no final questions.  I believe she is a good candidate for pump therapy, and is smart enough to use one.

## 2019-12-05 NOTE — Patient Instructions (Signed)
Go to the pump web sites to learn more about each pump model. Once you decide which pump you want, fill out paperwork and fax to the company. Review carb counting information and call if questions.

## 2020-03-06 ENCOUNTER — Ambulatory Visit: Payer: Medicare Other | Admitting: Cardiology

## 2020-04-08 ENCOUNTER — Ambulatory Visit: Payer: Medicare Other | Admitting: Cardiology

## 2020-05-02 ENCOUNTER — Encounter: Payer: Self-pay | Admitting: Cardiology

## 2020-05-02 ENCOUNTER — Other Ambulatory Visit: Payer: Self-pay

## 2020-05-02 ENCOUNTER — Ambulatory Visit: Payer: Medicare Other | Admitting: Cardiology

## 2020-05-02 VITALS — BP 158/82 | HR 57 | Resp 10 | Ht 65.0 in | Wt 165.0 lb

## 2020-05-02 DIAGNOSIS — Z794 Long term (current) use of insulin: Secondary | ICD-10-CM

## 2020-05-02 DIAGNOSIS — I10 Essential (primary) hypertension: Secondary | ICD-10-CM

## 2020-05-02 DIAGNOSIS — E119 Type 2 diabetes mellitus without complications: Secondary | ICD-10-CM

## 2020-05-02 DIAGNOSIS — E782 Mixed hyperlipidemia: Secondary | ICD-10-CM

## 2020-05-02 DIAGNOSIS — F17200 Nicotine dependence, unspecified, uncomplicated: Secondary | ICD-10-CM

## 2020-05-02 DIAGNOSIS — I2510 Atherosclerotic heart disease of native coronary artery without angina pectoris: Secondary | ICD-10-CM

## 2020-05-02 NOTE — Patient Instructions (Signed)
Please bring your CARDIAC medication bottles at the next office visit.   At the next visit please bring your most recent lipid profile and blood pressure log from your PCPs office for review.   Please go home and check to make sure you are NOT taking losartan/hydrochlorothiazide and valsartan.  If you are please call either our office or your PCP who is managing your blood pressure.

## 2020-05-02 NOTE — Progress Notes (Signed)
Mallory Mendez Date of Birth: [birth year] MRN: [id number] Primary Care Provider:Harris, Mallory Saxon, MD Former Cardiology Providers: Mallory Lager, APRN, FNP-C Primary Cardiologist: Mallory Kras, DO, [facility] (established care 05/02/2020)  Date: 05/02/20 Last Visit: 07/11/2019  Chief Complaint  Patient presents with  . Follow-up    CAD    HPI  Mallory Mendez is a 64 y.o.  female who presents to the office with a chief complaint of " follow-up for heart disease." Patient's past medical history and cardiovascular risk factors include: Diabetes, hypertension, hyperlipidemia, history of cerebral aneurysm repair in 2012, continued tobacco abuse, moderate diffuse CAD by angiogram in 2017, postmenopausal female.  Patient is being followed for management of coronary artery disease.  Formally she was in the care of Mallory Mendez is now here to reestablish care with myself.   Patient's last angiogram was in November 2020 that showed slight progression of CAD.  Since last visit, patient states that she is doing well from a cardiovascular standpoint.  She denies any chest pain or shortness of breath at rest or with effort related activities.  No hospitalizations or urgent care visits since last office visit.  No use of sublingual nitroglycerin tablets since last office encounter with Mallory Mendez.  Unfortunately, patient continues to smoke at least half a pack per day.  We discussed the management of her modifiable cardiovascular risk factors including hyperlipidemia, diabetes, and blood pressure.  Patient states that her blood pressure is being managed by her primary care provider and she is currently enrolled into remote care monitoring for her underlying hypertension.  Blood pressure log reviewed and scanned into the media section of epic.  I informed the patient that her blood pressures are not well controlled.  Her systolic blood pressures are consistently greater than 150 mmHg and with the history of diabetes and  CAD would recommend a systolic blood pressure of around 130 mmHg if able to tolerate.  Educated on importance of low-salt diet.  Medication reconciled performed during this encounter.  She did not have her medication bottles or list for me to review.  The medication list noted that she is on losartan/hydrochlorothiazide and valsartan.  Patient is not sure if she is taking both her only 100 to ARB's.  She thinks that she is not on valsartan.  In regards to diabetes mellitus patient states that her hemoglobin A1c recently was 7.0 I do not have the labs to confirm the findings.  Patient states that she has an upcoming office visit with Dr. Kenton Mendez next week and has repeat blood work prior to that.  FUNCTIONAL STATUS: Takes care of grandkids which keeps her active. No structured exercise program or daily routine.   ALLERGIES: Allergies  Allergen Reactions  . Cymbalta [Duloxetine Hcl] Anaphylaxis  . Latex Anaphylaxis and Hives     MEDICATION LIST PRIOR TO VISIT: Current Outpatient Medications on File Prior to Visit  Medication Sig Dispense Refill  . ALPRAZolam (XANAX) 1 MG tablet Take 1 mg by mouth 3 (three) times daily as needed for anxiety.     Marland Kitchen amLODipine (NORVASC) 10 MG tablet Take 1 tablet (10 mg total) by mouth daily. 90 tablet 1  . aspirin 81 MG chewable tablet Chew 81 mg by mouth daily.    Marland Kitchen atorvastatin (LIPITOR) 40 MG tablet Take 1 tablet (40 mg total) by mouth daily. 90 tablet 1  . buPROPion (WELLBUTRIN XL) 150 MG 24 hr tablet Take 150 mg by mouth daily.    Marland Kitchen esomeprazole (NEXIUM) 40 MG capsule  Take 40 mg by mouth every evening.     Marland Kitchen estradiol (ESTRACE) 1 MG tablet Take 1 mg by mouth daily.    . insulin aspart (NOVOLOG FLEXPEN) 100 UNIT/ML FlexPen Inject 8-12 Units into the skin 3 (three) times daily with meals. Sliding scale    . insulin glargine (LANTUS) 100 UNIT/ML injection Inject 63 Units into the skin 2 (two) times daily. 50 units in the morning, and 10 units at bedtime    .  isosorbide mononitrate (IMDUR) 30 MG 24 hr tablet Take 1 tablet (30 mg total) by mouth daily. 90 tablet 1  . levocetirizine (XYZAL) 5 MG tablet Take 5 mg by mouth every evening.    . metoprolol succinate (TOPROL-XL) 50 MG 24 hr tablet Take 1 tablet (50 mg total) by mouth daily. Take with or immediately following a meal. 90 tablet 1  . nitroGLYCERIN (NITROSTAT) 0.4 MG SL tablet Place 0.4 mg under the tongue every 5 (five) minutes as needed for chest pain.    Marland Kitchen oxycodone (ROXICODONE) 30 MG immediate release tablet Take 30 mg by mouth every 12 (twelve) hours as needed for pain.    Marland Kitchen oxyCODONE-acetaminophen (PERCOCET) 10-325 MG per tablet Take 1 tablet by mouth 3 (three) times daily.     Marland Kitchen zolpidem (AMBIEN) 10 MG tablet Take 10 mg by mouth at bedtime as needed for sleep.    Marland Kitchen augmented betamethasone dipropionate (DIPROLENE-AF) 0.05 % ointment Apply topically.    Marland Kitchen desonide (DESOWEN) 0.05 % cream SMARTSIG:Tablet(s) Topical Twice Daily PRN    . HUMALOG KWIKPEN 100 UNIT/ML KwikPen SMARTSIG:10-20 Unit(s) SUB-Q 3 Times Daily    . losartan-hydrochlorothiazide (HYZAAR) 100-25 MG tablet Take 1 tablet by mouth every morning.    . meclizine (ANTIVERT) 25 MG tablet Take 25 mg by mouth daily as needed.    . methocarbamol (ROBAXIN) 750 MG tablet Take 750 mg by mouth every 8 (eight) hours as needed.     No current facility-administered medications on file prior to visit.    PAST MEDICAL HISTORY: Past Medical History:  Diagnosis Date  . Anxiety   . Arthritis   . Blood transfusion   . Brain injury ([place])    1 1/2 YR AGO  . Chronic constipation   . Chronic stomach ulcer   . Concussion    HAS HAD DIZZINESS/ MEMORY PROBLEMS/ SLOW SPEACH /BALANCE PROBLEMS SINCE CONCUSSION 1 1/2 YR AGO PR GOES TO THERAPY  . Concussion   . Depression   . Diabetes mellitus   . Diabetes mellitus   . Dizziness   . Fibromyalgia   . Full dentures   . GERD (gastroesophageal reflux disease)   . Headache(784.0)   . Hernia     incisional  . History of back surgery    multiple  . Hyperlipidemia   . Hypertension   . MRSA (methicillin resistant Staphylococcus aureus)   . Nocturia   . Paroxysmal vertigo   . Scar   . Sleep difficulties   . Stroke ([place])    crica 2002  . Umbilical hernia   . Wears glasses     PAST SURGICAL HISTORY: Past Surgical History:  Procedure Laterality Date  . ABDOMINAL HYSTERECTOMY    . APPENDECTOMY    . BACK SURGERY     SPINAL FUSION X6   . CARDIAC CATHETERIZATION N/A 12/23/2015   Procedure: Left Heart Cath and Coronary Angiography;  Surgeon: Adrian Prows, MD;  Location: [facility];  Service: Cardiovascular;  Laterality: N/A;  . CEREBRAL  ANEURYSM REPAIR    . CHOLECYSTECTOMY  1984  . EYE SURGERY  2010   bilateral  . HERNIA REPAIR     09/21/2010  . HERNIA REPAIR  05/19/11   incisional hernia repair   . INCISIONAL HERNIA REPAIR  05/19/2011   Procedure: HERNIA REPAIR INCISIONAL;  Surgeon: Willey Blade, MD;  Location: [facility];  Service: General;  Laterality: N/A;  repair incisional hernia with mesh  . IR ANGIO INTRA EXTRACRAN SEL COM CAROTID INNOMINATE BILAT MOD SED  05/09/2018  . IR ANGIO VERTEBRAL SEL VERTEBRAL BILAT MOD SED  05/09/2018  . IR GENERIC HISTORICAL  09/30/2016   IR ANGIO VERTEBRAL SEL VERTEBRAL BILAT MOD SED 09/30/2016 Luanne Bras, MD [facility]  . IR GENERIC HISTORICAL  09/30/2016   IR ANGIO INTRA EXTRACRAN SEL COM CAROTID INNOMINATE BILAT MOD SED 09/30/2016 Luanne Bras, MD [facility]  . LEFT HEART CATH AND CORONARY ANGIOGRAPHY N/A 05/22/2019   Procedure: LEFT HEART CATH AND CORONARY ANGIOGRAPHY;  Surgeon: Adrian Prows, MD;  Location: [facility];  Service: Cardiovascular;  Laterality: N/A;  . LEFT HEART CATHETERIZATION WITH CORONARY ANGIOGRAM N/A 11/05/2013   Procedure: LEFT HEART CATHETERIZATION WITH CORONARY ANGIOGRAM;  Surgeon: Laverda Page, MD;  Location: [facility];  Service: Cardiovascular;  Laterality: N/A;  . SPINAL FUSION   2006  . TUBAL LIGATION      FAMILY HISTORY: The patient's family history includes Allergies in her daughter; Breast cancer in her mother; Heart disease in her father.   SOCIAL HISTORY:  The patient  reports that she has been smoking cigarettes. She has a 19.00 pack-year smoking history. She has never used smokeless tobacco. She reports that she does not drink alcohol and does not use drugs.  Review of Systems  Constitutional: Negative for chills and fever.  HENT: Negative for hoarse voice and nosebleeds.   Eyes: Negative for discharge, double vision and pain.  Cardiovascular: Negative for chest pain, claudication, dyspnea on exertion, leg swelling, near-syncope, orthopnea, palpitations, paroxysmal nocturnal dyspnea and syncope.  Respiratory: Negative for hemoptysis and shortness of breath.   Musculoskeletal: Negative for muscle cramps and myalgias.  Gastrointestinal: Negative for abdominal pain, constipation, diarrhea, hematemesis, hematochezia, melena, nausea and vomiting.  Neurological: Negative for disturbances in coordination, focal weakness and seizures.    PHYSICAL EXAM: Vitals with BMI 05/02/2020 07/11/2019 07/11/2019  Height 5\' 5"  - -  Weight 165 lbs - -  BMI 78.29 - -  Systolic 562 130 865  Diastolic 82 90 90  Pulse 57 - 79   CONSTITUTIONAL: Well-developed and well-nourished. No acute distress.  SKIN: Skin is warm and dry. No rash noted. No cyanosis. No pallor. No jaundice HEAD: Normocephalic and atraumatic.  EYES: No scleral icterus MOUTH/THROAT: Moist oral membranes.  NECK: No JVD present. No thyromegaly noted. No carotid bruits  LYMPHATIC: No visible cervical adenopathy.  CHEST Normal respiratory effort. No intercostal retractions  LUNGS: Clear to auscultation bilaterally.  No stridor. No wheezes. No rales.  CARDIOVASCULAR: Regular, positive S1-S2, no murmurs rubs or gallops appreciated ABDOMINAL: No apparent ascites.  EXTREMITIES: No peripheral edema  HEMATOLOGIC:  No significant bruising NEUROLOGIC: Oriented to person, place, and time. Nonfocal. Normal muscle tone.  PSYCHIATRIC: Normal mood and affect. Normal behavior. Cooperative  CARDIAC DATABASE: EKG: 05/02/2020: Sinus bradycardia, 56 bpm, normal axis, without underlying ischemia or injury pattern.   Echocardiogram: 05/18/2019: Left ventricle cavity is normal in size. Mild concentric hypertrophy of the left ventricle. Normal LV systolic function with EF 68%. Normal  global wall motion. Doppler evidence of grade I (impaired) diastolic dysfunction, normal LAP. Left atrial cavity is mildly dilated. Trileaflet aortic valve with mildly restricted aortic valve leaflets. Trace aortic valve stenosis. Aortic valve mean gradient of 7 mmHg, Vmax of 2.0 m/s. Calculated aortic valve area by continuity equation is 1.6  cm. No regurgitation noted. Mild (Grade I) mitral regurgitation. Mild tricuspid regurgitation. No evidence of pulmonary hypertension. Compared to previous study in 2014, trace aortic stenosis is new.   Stress Testing:  Nuclear stress test 12/05/2015:  Patient exercised on Bruce protocol for 5.0  minutes and achieved 4.64 METS. Stress test terminated due to target heart rate( 1.1% MPHR) and dyspnea.  Left ventricular cavity is noted to be enlarged on the stress study.  The TID index was 1.41.  SPECT images demonstrate homogeneous tracer distribution throughout the myocardium.   Gated SPECT images reveal normal myocardial thickening and wall motion.  LVEF visually estimated to be 63%.   Blood cells this is a high  risk study in view of abnormal EKG response and transient ischemic dilatation of LV noted on stress images.  clinical correlation recommended.  Heart Catheterization: Cardiac catheterization 05/22/2019: Normal LV systolic function, normal LVEDP. No wall motion abnormality. Left main: Distal left main shows about a 30% stenosis. LAD mild to moderate diffusely diseased vessel, in the  proximal segment diffuse 20 to 30% stenosis. Large D1, D2 and D3, mild ostial disease. Circumflex: High origin of OM1 from the proximal segment, bifurcation and proximal segment has a 40 to 50% stenosis followed by tandem mid 40 to 50% stenosis. RCA: PDA has a 40 to 50% stenosis, large vessel.  Carotid artery duplex 03/02/2017: No hemodynamically significant arterial disease in the internal carotid artery bilaterally. Antegrade right vertebral artery flow. Antegrade left vertebral artery flow.  LABORATORY DATA: CBC Latest Ref Rng & Units 05/21/2019 05/09/2018 09/30/2016  WBC 3.4 - 10.8 x10E3/uL 9.9 8.2 12.7(H)  Hemoglobin 11.1 - 15.9 g/dL 14.6 12.6 13.9  Hematocrit 34.0 - 46.6 % 42.5 37.7 41.8  Platelets 150 - 450 x10E3/uL 167 174 189    CMP Latest Ref Rng & Units 07/30/2019 05/21/2019 05/09/2018  Glucose 65 - 99 mg/dL 217(H) 266(H) 166(H)  BUN 8 - 27 mg/dL 7(L) 12 14  Creatinine 0.57 - 1.00 mg/dL 0.59 0.64 0.58  Sodium 134 - 144 mmol/L 142 138 135  Potassium 3.5 - 5.2 mmol/L 5.1 4.9 3.7  Chloride 96 - 106 mmol/L 103 98 104  CO2 20 - 29 mmol/L 22 23 24   Calcium 8.7 - 10.3 mg/dL 9.1 9.5 8.9  Total Protein 6.0 - 8.5 g/dL 7.0 - -  Total Bilirubin 0.0 - 1.2 mg/dL 0.3 - -  Alkaline Phos 39 - 117 IU/L 151(H) - -  AST 0 - 40 IU/L 30 - -  ALT 0 - 32 IU/L 30 - -    Lipid Panel     Component Value Date/Time   CHOL 162 07/30/2019 1308   TRIG 175 (H) 07/30/2019 1308   HDL 41 07/30/2019 1308   CHOLHDL 4.0 07/30/2019 1308   CHOLHDL 4.6 10/30/2013 0057   VLDL 49 (H) 10/30/2013 0057   LDLCALC 91 07/30/2019 1308   LABVLDL 30 07/30/2019 1308    No results found for: HGBA1C No components found for: NTPROBNP No results found for: TSH  Cardiac Panel (last 3 results) No results for input(s): CKTOTAL, CKMB, TROPONINIHS, RELINDX in the last 72 hours.  IMPRESSION:    ICD-10-CM   1. Atherosclerosis of native  coronary artery of native heart with stable angina pectoris ([place])  I25.118   2.  Mixed hyperlipidemia  E78.2   3. Tobacco use disorder  F17.200   4. Type 2 diabetes mellitus without complication, with long-term current use of insulin (HCC)  E11.9    Z79.4   5. Long-term insulin use (HCC)  Z79.4   6. Benign hypertension  I10 EKG 12-Lead     RECOMMENDATIONS: Mallory Mendez is a 64 y.o. female whose past medical history and cardiovascular risk factors include: Diabetes, hypertension, hyperlipidemia, history of cerebral aneurysm repair in 2012, continued tobacco abuse, moderate diffuse CAD by angiogram in 2017, postmenopausal female.  Atherosclerosis of the native coronary artery without angina pectoris  Since last office visit no hospitalizations for cardiovascular symptoms.  No use of sublingual nitroglycerin tablets since last office encounter.  Medications reconciled to the best of her recall ability.  Patient is asked to bring her cardiac medications or medication list at every visit for medication reconciliation purposes.  Educated on importance of glycemic control, diabetes managed by PCP.  Patient's blood pressure not well controlled, reviewed outside records from her PCPs office (remote patient monitoring).   Patient is currently on statin therapy.  She is due for a lipid profile prior to seeing her PCP next week.  Benign essential hypertension:  Reviewed the remote patient monitoring log received from her PCPs office.  Patient systolic blood pressures are consistently greater than 140 mmHg.  I have asked her to discuss further medication titration with her PCP who is currently managing her blood pressures.  Given her history of CAD and diabetes would recommend a systolic blood pressure goal of approximately 130 mmHg lower if able to tolerate.  Low-salt diet recommended.  Medications reconciliation difficult to perform at today's encounter.  The medication list had both losartan/hydrochlorthiazide and valsartan.  Patient thinks that she is not taking  valsartan anymore and therefore was discontinued from the list.  She is asked to go home and call the office to be reconciled the medications.  She understand that she should not be on both ARB's and the given time.  Insulin-dependent diabetes mellitus type 2: Patient states that her most recent hemoglobin A1c was 7 and prior to that it was 10.  Do not have the labs to confirm this.  She has repeat blood work with her PCP prior to the next office visit.  Hyperlipidemia: Currently managed by PCP.  Continue statin therapy.  Patient states that she has repeat lipid check prior to her next office visit with PCP.  Patient is asked to send a copy of her most recent hemoglobin A1c, lipids for [place] to review.  Had a detailed discussion with the patient that the goal should be focusing on improving her modifiable cardiovascular risk factors for prevention of CAD.  Would like to see her back in close follow-up to make sure her blood pressures have improved and her lipids are well managed.  FINAL MEDICATION LIST END OF ENCOUNTER: No orders of the defined types were placed in this encounter.   Current Outpatient Medications:  .  ALPRAZolam (XANAX) 1 MG tablet, Take 1 mg by mouth 3 (three) times daily as needed for anxiety. , Disp: , Rfl:  .  amLODipine (NORVASC) 10 MG tablet, Take 1 tablet (10 mg total) by mouth daily., Disp: 90 tablet, Rfl: 1 .  aspirin 81 MG chewable tablet, Chew 81 mg by mouth daily., Disp: , Rfl:  .  atorvastatin (LIPITOR) 40 MG  tablet, Take 1 tablet (40 mg total) by mouth daily., Disp: 90 tablet, Rfl: 1 .  buPROPion (WELLBUTRIN XL) 150 MG 24 hr tablet, Take 150 mg by mouth daily., Disp: , Rfl:  .  esomeprazole (NEXIUM) 40 MG capsule, Take 40 mg by mouth every evening. , Disp: , Rfl:  .  estradiol (ESTRACE) 1 MG tablet, Take 1 mg by mouth daily., Disp: , Rfl:  .  insulin aspart (NOVOLOG FLEXPEN) 100 UNIT/ML FlexPen, Inject 8-12 Units into the skin 3 (three) times daily with meals.  Sliding scale, Disp: , Rfl:  .  insulin glargine (LANTUS) 100 UNIT/ML injection, Inject 63 Units into the skin 2 (two) times daily. 50 units in the morning, and 10 units at bedtime, Disp: , Rfl:  .  isosorbide mononitrate (IMDUR) 30 MG 24 hr tablet, Take 1 tablet (30 mg total) by mouth daily., Disp: 90 tablet, Rfl: 1 .  levocetirizine (XYZAL) 5 MG tablet, Take 5 mg by mouth every evening., Disp: , Rfl:  .  metoprolol succinate (TOPROL-XL) 50 MG 24 hr tablet, Take 1 tablet (50 mg total) by mouth daily. Take with or immediately following a meal., Disp: 90 tablet, Rfl: 1 .  nitroGLYCERIN (NITROSTAT) 0.4 MG SL tablet, Place 0.4 mg under the tongue every 5 (five) minutes as needed for chest pain., Disp: , Rfl:  .  oxycodone (ROXICODONE) 30 MG immediate release tablet, Take 30 mg by mouth every 12 (twelve) hours as needed for pain., Disp: , Rfl:  .  oxyCODONE-acetaminophen (PERCOCET) 10-325 MG per tablet, Take 1 tablet by mouth 3 (three) times daily. , Disp: , Rfl:  .  zolpidem (AMBIEN) 10 MG tablet, Take 10 mg by mouth at bedtime as needed for sleep., Disp: , Rfl:  .  augmented betamethasone dipropionate (DIPROLENE-AF) 0.05 % ointment, Apply topically., Disp: , Rfl:  .  desonide (DESOWEN) 0.05 % cream, SMARTSIG:Tablet(s) Topical Twice Daily PRN, Disp: , Rfl:  .  HUMALOG KWIKPEN 100 UNIT/ML KwikPen, SMARTSIG:10-20 Unit(s) SUB-Q 3 Times Daily, Disp: , Rfl:  .  losartan-hydrochlorothiazide (HYZAAR) 100-25 MG tablet, Take 1 tablet by mouth every morning., Disp: , Rfl:  .  meclizine (ANTIVERT) 25 MG tablet, Take 25 mg by mouth daily as needed., Disp: , Rfl:  .  methocarbamol (ROBAXIN) 750 MG tablet, Take 750 mg by mouth every 8 (eight) hours as needed., Disp: , Rfl:   Orders Placed This Encounter  Procedures  . EKG 12-Lead   --Continue cardiac medications as reconciled in final medication list. --Return in about 3 months (around 08/02/2020) for followup secondary prevention of CAD. Or sooner if  needed. --Continue follow-up with your primary care physician regarding the management of your other chronic comorbid conditions.  Patient Instructions  Please bring your CARDIAC medication bottles at the next office visit.   At the next visit please bring your most recent lipid profile and blood pressure log from your PCPs office for review.   Please go home and check to make sure you are NOT taking losartan/hydrochlorothiazide and valsartan.  If you are please call either our office or your PCP who is managing your blood pressure.  Patient's questions and concerns were addressed to her satisfaction. She voices understanding of the instructions provided during this encounter.   This note was created using a voice recognition software as a result there may be grammatical errors inadvertently enclosed that do not reflect the nature of this encounter. Every attempt is made to correct such errors.  Total time spent: 45  minutes.  Reviewing prior records, reviewing past office notes, reviewing records from her PCPs office regarding remote blood pressure monitoring, discussing disease management, and coordination of care.  Mallory Mendez, [place], [facility]  Pager: [phone] Office: [phone]

## 2020-07-01 ENCOUNTER — Other Ambulatory Visit (HOSPITAL_COMMUNITY): Payer: Self-pay | Admitting: Interventional Radiology

## 2020-07-01 DIAGNOSIS — I671 Cerebral aneurysm, nonruptured: Secondary | ICD-10-CM

## 2020-07-23 DIAGNOSIS — M25551 Pain in right hip: Secondary | ICD-10-CM | POA: Diagnosis not present

## 2020-07-23 DIAGNOSIS — M25552 Pain in left hip: Secondary | ICD-10-CM | POA: Diagnosis not present

## 2020-07-24 DIAGNOSIS — E1042 Type 1 diabetes mellitus with diabetic polyneuropathy: Secondary | ICD-10-CM | POA: Diagnosis not present

## 2020-07-24 DIAGNOSIS — E1165 Type 2 diabetes mellitus with hyperglycemia: Secondary | ICD-10-CM | POA: Diagnosis not present

## 2020-07-24 DIAGNOSIS — I10 Essential (primary) hypertension: Secondary | ICD-10-CM | POA: Diagnosis not present

## 2020-07-24 DIAGNOSIS — E109 Type 1 diabetes mellitus without complications: Secondary | ICD-10-CM | POA: Diagnosis not present

## 2020-07-24 DIAGNOSIS — E1065 Type 1 diabetes mellitus with hyperglycemia: Secondary | ICD-10-CM | POA: Diagnosis not present

## 2020-07-24 DIAGNOSIS — E7800 Pure hypercholesterolemia, unspecified: Secondary | ICD-10-CM | POA: Diagnosis not present

## 2020-07-24 DIAGNOSIS — I2510 Atherosclerotic heart disease of native coronary artery without angina pectoris: Secondary | ICD-10-CM | POA: Diagnosis not present

## 2020-07-24 DIAGNOSIS — K219 Gastro-esophageal reflux disease without esophagitis: Secondary | ICD-10-CM | POA: Diagnosis not present

## 2020-07-30 ENCOUNTER — Ambulatory Visit (HOSPITAL_COMMUNITY): Admission: RE | Admit: 2020-07-30 | Payer: Medicare Other | Source: Ambulatory Visit

## 2020-07-30 ENCOUNTER — Ambulatory Visit (HOSPITAL_COMMUNITY): Payer: Medicare Other

## 2020-08-04 ENCOUNTER — Ambulatory Visit: Payer: Medicare Other | Admitting: Cardiology

## 2020-08-04 NOTE — Progress Notes (Deleted)
NO SHOW.   Mechele Claude [facility]  Pager: [phone] Office: [phone] 08/04/20

## 2020-08-15 DIAGNOSIS — E1042 Type 1 diabetes mellitus with diabetic polyneuropathy: Secondary | ICD-10-CM | POA: Diagnosis not present

## 2020-08-15 DIAGNOSIS — I10 Essential (primary) hypertension: Secondary | ICD-10-CM | POA: Diagnosis not present

## 2020-08-15 DIAGNOSIS — M797 Fibromyalgia: Secondary | ICD-10-CM | POA: Diagnosis not present

## 2020-08-15 DIAGNOSIS — G894 Chronic pain syndrome: Secondary | ICD-10-CM | POA: Diagnosis not present

## 2020-08-15 DIAGNOSIS — F17200 Nicotine dependence, unspecified, uncomplicated: Secondary | ICD-10-CM | POA: Diagnosis not present

## 2020-08-15 DIAGNOSIS — L719 Rosacea, unspecified: Secondary | ICD-10-CM | POA: Diagnosis not present

## 2020-08-15 DIAGNOSIS — K219 Gastro-esophageal reflux disease without esophagitis: Secondary | ICD-10-CM | POA: Diagnosis not present

## 2020-08-15 DIAGNOSIS — E7800 Pure hypercholesterolemia, unspecified: Secondary | ICD-10-CM | POA: Diagnosis not present

## 2020-08-20 DIAGNOSIS — E1065 Type 1 diabetes mellitus with hyperglycemia: Secondary | ICD-10-CM | POA: Diagnosis not present

## 2020-08-20 DIAGNOSIS — K219 Gastro-esophageal reflux disease without esophagitis: Secondary | ICD-10-CM | POA: Diagnosis not present

## 2020-08-20 DIAGNOSIS — E109 Type 1 diabetes mellitus without complications: Secondary | ICD-10-CM | POA: Diagnosis not present

## 2020-08-20 DIAGNOSIS — E1165 Type 2 diabetes mellitus with hyperglycemia: Secondary | ICD-10-CM | POA: Diagnosis not present

## 2020-08-20 DIAGNOSIS — E1042 Type 1 diabetes mellitus with diabetic polyneuropathy: Secondary | ICD-10-CM | POA: Diagnosis not present

## 2020-08-20 DIAGNOSIS — E7800 Pure hypercholesterolemia, unspecified: Secondary | ICD-10-CM | POA: Diagnosis not present

## 2020-08-20 DIAGNOSIS — I10 Essential (primary) hypertension: Secondary | ICD-10-CM | POA: Diagnosis not present

## 2020-08-20 DIAGNOSIS — I2510 Atherosclerotic heart disease of native coronary artery without angina pectoris: Secondary | ICD-10-CM | POA: Diagnosis not present

## 2020-09-16 DIAGNOSIS — E1042 Type 1 diabetes mellitus with diabetic polyneuropathy: Secondary | ICD-10-CM | POA: Diagnosis not present

## 2020-09-16 DIAGNOSIS — E7800 Pure hypercholesterolemia, unspecified: Secondary | ICD-10-CM | POA: Diagnosis not present

## 2020-09-16 DIAGNOSIS — E1165 Type 2 diabetes mellitus with hyperglycemia: Secondary | ICD-10-CM | POA: Diagnosis not present

## 2020-09-16 DIAGNOSIS — K219 Gastro-esophageal reflux disease without esophagitis: Secondary | ICD-10-CM | POA: Diagnosis not present

## 2020-09-16 DIAGNOSIS — I10 Essential (primary) hypertension: Secondary | ICD-10-CM | POA: Diagnosis not present

## 2020-09-16 DIAGNOSIS — I2510 Atherosclerotic heart disease of native coronary artery without angina pectoris: Secondary | ICD-10-CM | POA: Diagnosis not present

## 2020-09-16 DIAGNOSIS — E1065 Type 1 diabetes mellitus with hyperglycemia: Secondary | ICD-10-CM | POA: Diagnosis not present

## 2020-09-16 DIAGNOSIS — E109 Type 1 diabetes mellitus without complications: Secondary | ICD-10-CM | POA: Diagnosis not present

## 2020-09-30 ENCOUNTER — Ambulatory Visit (HOSPITAL_COMMUNITY): Admission: RE | Admit: 2020-09-30 | Payer: Medicare Other | Source: Ambulatory Visit

## 2020-09-30 ENCOUNTER — Encounter (HOSPITAL_COMMUNITY): Payer: Self-pay

## 2020-09-30 ENCOUNTER — Ambulatory Visit (HOSPITAL_COMMUNITY): Payer: Medicare Other | Attending: Interventional Radiology

## 2020-10-13 DIAGNOSIS — L03211 Cellulitis of face: Secondary | ICD-10-CM | POA: Diagnosis not present

## 2020-10-13 DIAGNOSIS — L739 Follicular disorder, unspecified: Secondary | ICD-10-CM | POA: Diagnosis not present

## 2020-10-13 DIAGNOSIS — R5383 Other fatigue: Secondary | ICD-10-CM | POA: Diagnosis not present

## 2020-10-13 DIAGNOSIS — Z20822 Contact with and (suspected) exposure to covid-19: Secondary | ICD-10-CM | POA: Diagnosis not present

## 2020-10-14 DIAGNOSIS — E109 Type 1 diabetes mellitus without complications: Secondary | ICD-10-CM | POA: Diagnosis not present

## 2020-10-14 DIAGNOSIS — E7800 Pure hypercholesterolemia, unspecified: Secondary | ICD-10-CM | POA: Diagnosis not present

## 2020-10-14 DIAGNOSIS — E1065 Type 1 diabetes mellitus with hyperglycemia: Secondary | ICD-10-CM | POA: Diagnosis not present

## 2020-10-14 DIAGNOSIS — K219 Gastro-esophageal reflux disease without esophagitis: Secondary | ICD-10-CM | POA: Diagnosis not present

## 2020-10-14 DIAGNOSIS — E1042 Type 1 diabetes mellitus with diabetic polyneuropathy: Secondary | ICD-10-CM | POA: Diagnosis not present

## 2020-10-14 DIAGNOSIS — I2510 Atherosclerotic heart disease of native coronary artery without angina pectoris: Secondary | ICD-10-CM | POA: Diagnosis not present

## 2020-10-14 DIAGNOSIS — E1165 Type 2 diabetes mellitus with hyperglycemia: Secondary | ICD-10-CM | POA: Diagnosis not present

## 2020-10-14 DIAGNOSIS — I10 Essential (primary) hypertension: Secondary | ICD-10-CM | POA: Diagnosis not present

## 2020-12-02 DIAGNOSIS — E1065 Type 1 diabetes mellitus with hyperglycemia: Secondary | ICD-10-CM | POA: Diagnosis not present

## 2020-12-02 DIAGNOSIS — F17200 Nicotine dependence, unspecified, uncomplicated: Secondary | ICD-10-CM | POA: Diagnosis not present

## 2020-12-02 DIAGNOSIS — I10 Essential (primary) hypertension: Secondary | ICD-10-CM | POA: Diagnosis not present

## 2020-12-02 DIAGNOSIS — E7800 Pure hypercholesterolemia, unspecified: Secondary | ICD-10-CM | POA: Diagnosis not present

## 2020-12-02 DIAGNOSIS — I2510 Atherosclerotic heart disease of native coronary artery without angina pectoris: Secondary | ICD-10-CM | POA: Diagnosis not present

## 2020-12-02 DIAGNOSIS — L03211 Cellulitis of face: Secondary | ICD-10-CM | POA: Diagnosis not present

## 2020-12-02 DIAGNOSIS — G894 Chronic pain syndrome: Secondary | ICD-10-CM | POA: Diagnosis not present

## 2020-12-02 DIAGNOSIS — L719 Rosacea, unspecified: Secondary | ICD-10-CM | POA: Diagnosis not present

## 2021-01-20 DIAGNOSIS — M25552 Pain in left hip: Secondary | ICD-10-CM | POA: Diagnosis not present

## 2021-01-20 DIAGNOSIS — M25551 Pain in right hip: Secondary | ICD-10-CM | POA: Diagnosis not present

## 2021-02-09 DIAGNOSIS — E7800 Pure hypercholesterolemia, unspecified: Secondary | ICD-10-CM | POA: Diagnosis not present

## 2021-02-09 DIAGNOSIS — E1042 Type 1 diabetes mellitus with diabetic polyneuropathy: Secondary | ICD-10-CM | POA: Diagnosis not present

## 2021-02-09 DIAGNOSIS — I10 Essential (primary) hypertension: Secondary | ICD-10-CM | POA: Diagnosis not present

## 2021-02-09 DIAGNOSIS — I2510 Atherosclerotic heart disease of native coronary artery without angina pectoris: Secondary | ICD-10-CM | POA: Diagnosis not present

## 2021-02-09 DIAGNOSIS — E1165 Type 2 diabetes mellitus with hyperglycemia: Secondary | ICD-10-CM | POA: Diagnosis not present

## 2021-02-09 DIAGNOSIS — K219 Gastro-esophageal reflux disease without esophagitis: Secondary | ICD-10-CM | POA: Diagnosis not present

## 2021-02-09 DIAGNOSIS — E1065 Type 1 diabetes mellitus with hyperglycemia: Secondary | ICD-10-CM | POA: Diagnosis not present

## 2021-02-09 DIAGNOSIS — E109 Type 1 diabetes mellitus without complications: Secondary | ICD-10-CM | POA: Diagnosis not present

## 2021-02-12 DIAGNOSIS — E7800 Pure hypercholesterolemia, unspecified: Secondary | ICD-10-CM | POA: Diagnosis not present

## 2021-02-12 DIAGNOSIS — E109 Type 1 diabetes mellitus without complications: Secondary | ICD-10-CM | POA: Diagnosis not present

## 2021-02-12 DIAGNOSIS — G894 Chronic pain syndrome: Secondary | ICD-10-CM | POA: Diagnosis not present

## 2021-02-12 DIAGNOSIS — N3000 Acute cystitis without hematuria: Secondary | ICD-10-CM | POA: Diagnosis not present

## 2021-02-12 DIAGNOSIS — R5383 Other fatigue: Secondary | ICD-10-CM | POA: Diagnosis not present

## 2021-02-12 DIAGNOSIS — I10 Essential (primary) hypertension: Secondary | ICD-10-CM | POA: Diagnosis not present

## 2021-02-12 DIAGNOSIS — M62838 Other muscle spasm: Secondary | ICD-10-CM | POA: Diagnosis not present

## 2021-02-25 DIAGNOSIS — Z1211 Encounter for screening for malignant neoplasm of colon: Secondary | ICD-10-CM | POA: Diagnosis not present

## 2021-03-03 DIAGNOSIS — E1140 Type 2 diabetes mellitus with diabetic neuropathy, unspecified: Secondary | ICD-10-CM | POA: Diagnosis not present

## 2021-03-03 DIAGNOSIS — E041 Nontoxic single thyroid nodule: Secondary | ICD-10-CM | POA: Diagnosis not present

## 2021-03-03 DIAGNOSIS — E7800 Pure hypercholesterolemia, unspecified: Secondary | ICD-10-CM | POA: Diagnosis not present

## 2021-03-03 DIAGNOSIS — K3184 Gastroparesis: Secondary | ICD-10-CM | POA: Diagnosis not present

## 2021-03-03 DIAGNOSIS — E1065 Type 1 diabetes mellitus with hyperglycemia: Secondary | ICD-10-CM | POA: Diagnosis not present

## 2021-03-03 DIAGNOSIS — I10 Essential (primary) hypertension: Secondary | ICD-10-CM | POA: Diagnosis not present

## 2021-03-19 DIAGNOSIS — E109 Type 1 diabetes mellitus without complications: Secondary | ICD-10-CM | POA: Diagnosis not present

## 2021-03-19 DIAGNOSIS — I10 Essential (primary) hypertension: Secondary | ICD-10-CM | POA: Diagnosis not present

## 2021-03-19 DIAGNOSIS — G4710 Hypersomnia, unspecified: Secondary | ICD-10-CM | POA: Diagnosis not present

## 2021-03-25 DIAGNOSIS — G894 Chronic pain syndrome: Secondary | ICD-10-CM | POA: Diagnosis not present

## 2021-03-30 DIAGNOSIS — L439 Lichen planus, unspecified: Secondary | ICD-10-CM | POA: Diagnosis not present

## 2021-04-08 DIAGNOSIS — I10 Essential (primary) hypertension: Secondary | ICD-10-CM | POA: Diagnosis not present

## 2021-04-08 DIAGNOSIS — I2510 Atherosclerotic heart disease of native coronary artery without angina pectoris: Secondary | ICD-10-CM | POA: Diagnosis not present

## 2021-04-08 DIAGNOSIS — E1065 Type 1 diabetes mellitus with hyperglycemia: Secondary | ICD-10-CM | POA: Diagnosis not present

## 2021-04-08 DIAGNOSIS — E1042 Type 1 diabetes mellitus with diabetic polyneuropathy: Secondary | ICD-10-CM | POA: Diagnosis not present

## 2021-04-08 DIAGNOSIS — K219 Gastro-esophageal reflux disease without esophagitis: Secondary | ICD-10-CM | POA: Diagnosis not present

## 2021-04-08 DIAGNOSIS — E7800 Pure hypercholesterolemia, unspecified: Secondary | ICD-10-CM | POA: Diagnosis not present

## 2021-04-10 ENCOUNTER — Other Ambulatory Visit (HOSPITAL_BASED_OUTPATIENT_CLINIC_OR_DEPARTMENT_OTHER): Payer: Self-pay

## 2021-04-10 DIAGNOSIS — G4710 Hypersomnia, unspecified: Secondary | ICD-10-CM

## 2021-04-10 DIAGNOSIS — R0681 Apnea, not elsewhere classified: Secondary | ICD-10-CM

## 2021-04-10 DIAGNOSIS — R0683 Snoring: Secondary | ICD-10-CM

## 2021-05-18 DIAGNOSIS — J40 Bronchitis, not specified as acute or chronic: Secondary | ICD-10-CM | POA: Diagnosis not present

## 2021-05-18 DIAGNOSIS — L719 Rosacea, unspecified: Secondary | ICD-10-CM | POA: Diagnosis not present

## 2021-05-18 DIAGNOSIS — G894 Chronic pain syndrome: Secondary | ICD-10-CM | POA: Diagnosis not present

## 2021-05-18 DIAGNOSIS — E1042 Type 1 diabetes mellitus with diabetic polyneuropathy: Secondary | ICD-10-CM | POA: Diagnosis not present

## 2021-05-18 DIAGNOSIS — F17200 Nicotine dependence, unspecified, uncomplicated: Secondary | ICD-10-CM | POA: Diagnosis not present

## 2021-05-18 DIAGNOSIS — I2510 Atherosclerotic heart disease of native coronary artery without angina pectoris: Secondary | ICD-10-CM | POA: Diagnosis not present

## 2021-05-18 DIAGNOSIS — E7800 Pure hypercholesterolemia, unspecified: Secondary | ICD-10-CM | POA: Diagnosis not present

## 2021-05-18 DIAGNOSIS — I10 Essential (primary) hypertension: Secondary | ICD-10-CM | POA: Diagnosis not present

## 2021-05-26 ENCOUNTER — Encounter (HOSPITAL_COMMUNITY): Payer: Self-pay | Admitting: *Deleted

## 2021-05-26 ENCOUNTER — Emergency Department (HOSPITAL_COMMUNITY)
Admission: EM | Admit: 2021-05-26 | Discharge: 2021-05-26 | Disposition: A | Discharge status: Home / Self Care | Payer: Medicare Other | Attending: Emergency Medicine | Admitting: Emergency Medicine

## 2021-05-26 ENCOUNTER — Emergency Department (HOSPITAL_COMMUNITY): Payer: Medicare Other

## 2021-05-26 ENCOUNTER — Other Ambulatory Visit: Payer: Self-pay

## 2021-05-26 DIAGNOSIS — M47812 Spondylosis without myelopathy or radiculopathy, cervical region: Secondary | ICD-10-CM | POA: Diagnosis not present

## 2021-05-26 DIAGNOSIS — R200 Anesthesia of skin: Secondary | ICD-10-CM | POA: Diagnosis not present

## 2021-05-26 DIAGNOSIS — F17210 Nicotine dependence, cigarettes, uncomplicated: Secondary | ICD-10-CM | POA: Diagnosis not present

## 2021-05-26 DIAGNOSIS — E1065 Type 1 diabetes mellitus with hyperglycemia: Secondary | ICD-10-CM | POA: Diagnosis not present

## 2021-05-26 DIAGNOSIS — I2510 Atherosclerotic heart disease of native coronary artery without angina pectoris: Secondary | ICD-10-CM | POA: Diagnosis not present

## 2021-05-26 DIAGNOSIS — Z79899 Other long term (current) drug therapy: Secondary | ICD-10-CM | POA: Insufficient documentation

## 2021-05-26 DIAGNOSIS — I499 Cardiac arrhythmia, unspecified: Secondary | ICD-10-CM | POA: Diagnosis not present

## 2021-05-26 DIAGNOSIS — Z743 Need for continuous supervision: Secondary | ICD-10-CM | POA: Diagnosis not present

## 2021-05-26 DIAGNOSIS — E1042 Type 1 diabetes mellitus with diabetic polyneuropathy: Secondary | ICD-10-CM | POA: Diagnosis not present

## 2021-05-26 DIAGNOSIS — E109 Type 1 diabetes mellitus without complications: Secondary | ICD-10-CM | POA: Diagnosis not present

## 2021-05-26 DIAGNOSIS — Z7982 Long term (current) use of aspirin: Secondary | ICD-10-CM | POA: Insufficient documentation

## 2021-05-26 DIAGNOSIS — E119 Type 2 diabetes mellitus without complications: Secondary | ICD-10-CM | POA: Insufficient documentation

## 2021-05-26 DIAGNOSIS — Y92410 Unspecified street and highway as the place of occurrence of the external cause: Secondary | ICD-10-CM | POA: Diagnosis not present

## 2021-05-26 DIAGNOSIS — Z794 Long term (current) use of insulin: Secondary | ICD-10-CM | POA: Insufficient documentation

## 2021-05-26 DIAGNOSIS — R519 Headache, unspecified: Secondary | ICD-10-CM | POA: Insufficient documentation

## 2021-05-26 DIAGNOSIS — R739 Hyperglycemia, unspecified: Secondary | ICD-10-CM | POA: Diagnosis not present

## 2021-05-26 DIAGNOSIS — S161XXA Strain of muscle, fascia and tendon at neck level, initial encounter: Secondary | ICD-10-CM | POA: Diagnosis not present

## 2021-05-26 DIAGNOSIS — I10 Essential (primary) hypertension: Secondary | ICD-10-CM | POA: Diagnosis not present

## 2021-05-26 DIAGNOSIS — M542 Cervicalgia: Secondary | ICD-10-CM | POA: Diagnosis not present

## 2021-05-26 DIAGNOSIS — Z91040 Latex allergy status: Secondary | ICD-10-CM | POA: Diagnosis not present

## 2021-05-26 DIAGNOSIS — R110 Nausea: Secondary | ICD-10-CM | POA: Diagnosis not present

## 2021-05-26 DIAGNOSIS — E7800 Pure hypercholesterolemia, unspecified: Secondary | ICD-10-CM | POA: Diagnosis not present

## 2021-05-26 DIAGNOSIS — R6889 Other general symptoms and signs: Secondary | ICD-10-CM | POA: Diagnosis not present

## 2021-05-26 DIAGNOSIS — E1165 Type 2 diabetes mellitus with hyperglycemia: Secondary | ICD-10-CM | POA: Diagnosis not present

## 2021-05-26 DIAGNOSIS — K219 Gastro-esophageal reflux disease without esophagitis: Secondary | ICD-10-CM | POA: Diagnosis not present

## 2021-05-26 DIAGNOSIS — M2578 Osteophyte, vertebrae: Secondary | ICD-10-CM | POA: Diagnosis not present

## 2021-05-26 DIAGNOSIS — S169XXA Unspecified injury of muscle, fascia and tendon at neck level, initial encounter: Secondary | ICD-10-CM | POA: Diagnosis present

## 2021-05-26 MED ORDER — ACETAMINOPHEN 325 MG PO TABS
650.0000 mg | ORAL_TABLET | Freq: Once | ORAL | Status: AC
  Administered 2021-05-26: 650 mg via ORAL
  Filled 2021-05-26: qty 2
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
## 2021-05-26 NOTE — ED Notes (Signed)
Dc instructions reviewed with pt no questions or concerns at this time.  
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
## 2021-06-09 ENCOUNTER — Encounter: Payer: Self-pay | Admitting: Orthopaedic Surgery

## 2021-06-09 ENCOUNTER — Ambulatory Visit: Payer: Self-pay

## 2021-06-09 ENCOUNTER — Ambulatory Visit (INDEPENDENT_AMBULATORY_CARE_PROVIDER_SITE_OTHER): Payer: Medicare Other | Admitting: Orthopaedic Surgery

## 2021-06-09 ENCOUNTER — Other Ambulatory Visit: Payer: Self-pay

## 2021-06-09 DIAGNOSIS — S161XXA Strain of muscle, fascia and tendon at neck level, initial encounter: Secondary | ICD-10-CM | POA: Diagnosis not present

## 2021-06-09 DIAGNOSIS — S39012A Strain of muscle, fascia and tendon of lower back, initial encounter: Secondary | ICD-10-CM | POA: Diagnosis not present

## 2021-06-09 MED ORDER — PREDNISONE 10 MG (21) PO TBPK: ORAL_TABLET | ORAL | 0 refills | Status: DC

## 2021-06-09 MED ORDER — METHOCARBAMOL 500 MG PO TABS: 500.0000 mg | ORAL_TABLET | Freq: Two times a day (BID) | ORAL | 0 refills | Status: DC | PRN
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
## 2021-06-22 DIAGNOSIS — E1042 Type 1 diabetes mellitus with diabetic polyneuropathy: Secondary | ICD-10-CM | POA: Diagnosis not present

## 2021-06-22 DIAGNOSIS — E7800 Pure hypercholesterolemia, unspecified: Secondary | ICD-10-CM | POA: Diagnosis not present

## 2021-06-22 DIAGNOSIS — E1065 Type 1 diabetes mellitus with hyperglycemia: Secondary | ICD-10-CM | POA: Diagnosis not present

## 2021-06-22 DIAGNOSIS — E1165 Type 2 diabetes mellitus with hyperglycemia: Secondary | ICD-10-CM | POA: Diagnosis not present

## 2021-06-22 DIAGNOSIS — K219 Gastro-esophageal reflux disease without esophagitis: Secondary | ICD-10-CM | POA: Diagnosis not present

## 2021-06-22 DIAGNOSIS — I2510 Atherosclerotic heart disease of native coronary artery without angina pectoris: Secondary | ICD-10-CM | POA: Diagnosis not present

## 2021-06-22 DIAGNOSIS — I10 Essential (primary) hypertension: Secondary | ICD-10-CM | POA: Diagnosis not present

## 2021-06-25 ENCOUNTER — Ambulatory Visit: Payer: Self-pay

## 2021-06-25 ENCOUNTER — Ambulatory Visit (INDEPENDENT_AMBULATORY_CARE_PROVIDER_SITE_OTHER): Payer: Medicare Other | Admitting: Specialist

## 2021-06-25 ENCOUNTER — Other Ambulatory Visit: Payer: Self-pay

## 2021-06-25 ENCOUNTER — Encounter: Payer: Self-pay | Admitting: Specialist

## 2021-06-25 VITALS — BP 189/100 | HR 65 | Ht 65.0 in | Wt 139.5 lb

## 2021-06-25 DIAGNOSIS — M6281 Muscle weakness (generalized): Secondary | ICD-10-CM

## 2021-06-25 DIAGNOSIS — S39012A Strain of muscle, fascia and tendon of lower back, initial encounter: Secondary | ICD-10-CM | POA: Diagnosis not present

## 2021-06-25 DIAGNOSIS — M542 Cervicalgia: Secondary | ICD-10-CM

## 2021-06-25 DIAGNOSIS — M546 Pain in thoracic spine: Secondary | ICD-10-CM | POA: Diagnosis not present

## 2021-06-25 MED ORDER — GABAPENTIN 100 MG PO CAPS: 100.0000 mg | ORAL_CAPSULE | Freq: Every day | ORAL | 1 refills | Status: DC

## 2021-06-25 NOTE — Patient Instructions (Addendum)
°  Plan: Avoid frequent bending and stooping  No lifting greater than 10 lbs. May use ice or moist heat for pain. Weight loss is of benefit. Best medication for lumbar disc disease is arthritis medications like motrin, celebrex and naprosyn. Exercise is important to improve your indurance and does allow people to function better inspite of back pain. MRI of the cervical spine and thoracic spine ordered due to the numerous wedge changes seen in the mid and mid to lower thoracic spine to r/o acute compression deformity. MRI of the cervical spine due to percieved left arm weakness and persisting pain with radiation into the interscapular spine this can be seen with central disc herniations of the cervical or upper thoracic spine.  Gabapentin 100 mg po qhs
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
## 2021-07-18 ENCOUNTER — Ambulatory Visit
Admission: RE | Admit: 2021-07-18 | Discharge: 2021-07-18 | Disposition: A | Discharge status: Home / Self Care | Payer: Medicare Other | Source: Ambulatory Visit | Attending: Specialist | Admitting: Specialist

## 2021-07-18 ENCOUNTER — Other Ambulatory Visit: Payer: Self-pay

## 2021-07-18 DIAGNOSIS — M546 Pain in thoracic spine: Secondary | ICD-10-CM

## 2021-07-18 DIAGNOSIS — M6281 Muscle weakness (generalized): Secondary | ICD-10-CM

## 2021-07-18 DIAGNOSIS — M542 Cervicalgia: Secondary | ICD-10-CM

## 2021-07-18 DIAGNOSIS — R200 Anesthesia of skin: Secondary | ICD-10-CM | POA: Diagnosis not present

## 2021-07-23 DIAGNOSIS — M25552 Pain in left hip: Secondary | ICD-10-CM | POA: Diagnosis not present

## 2021-07-23 DIAGNOSIS — M25551 Pain in right hip: Secondary | ICD-10-CM | POA: Diagnosis not present

## 2021-07-27 ENCOUNTER — Telehealth: Payer: Self-pay | Admitting: Specialist

## 2021-07-27 NOTE — Telephone Encounter (Signed)
Patient called. She would like an appointment for MRI review. She would like a call back. [phone]

## 2021-07-28 NOTE — Telephone Encounter (Signed)
Lmom advised that I have her on the schedule for 08/21/21 @ 945 am, I also advised that I have put her on the cancellation list and will call if we can get her in sooner.I also advised that if this did not work for her she needs to call the office back and reschedule it with a scheduler.

## 2021-08-02 ENCOUNTER — Other Ambulatory Visit: Payer: Self-pay | Admitting: Specialist

## 2021-08-02 DIAGNOSIS — S39012A Strain of muscle, fascia and tendon of lower back, initial encounter: Secondary | ICD-10-CM

## 2021-08-02 DIAGNOSIS — M546 Pain in thoracic spine: Secondary | ICD-10-CM

## 2021-08-03 DIAGNOSIS — E7800 Pure hypercholesterolemia, unspecified: Secondary | ICD-10-CM | POA: Diagnosis not present

## 2021-08-03 DIAGNOSIS — I10 Essential (primary) hypertension: Secondary | ICD-10-CM | POA: Diagnosis not present

## 2021-08-03 DIAGNOSIS — K219 Gastro-esophageal reflux disease without esophagitis: Secondary | ICD-10-CM | POA: Diagnosis not present

## 2021-08-03 DIAGNOSIS — E1042 Type 1 diabetes mellitus with diabetic polyneuropathy: Secondary | ICD-10-CM | POA: Diagnosis not present

## 2021-08-03 DIAGNOSIS — I2510 Atherosclerotic heart disease of native coronary artery without angina pectoris: Secondary | ICD-10-CM | POA: Diagnosis not present

## 2021-08-06 ENCOUNTER — Ambulatory Visit: Payer: Medicare Other | Admitting: Specialist

## 2021-08-06 ENCOUNTER — Encounter: Payer: Self-pay | Admitting: Specialist

## 2021-08-06 ENCOUNTER — Other Ambulatory Visit: Payer: Self-pay

## 2021-08-06 VITALS — BP 199/80 | HR 58 | Ht 65.0 in | Wt 139.5 lb

## 2021-08-06 DIAGNOSIS — S39012A Strain of muscle, fascia and tendon of lower back, initial encounter: Secondary | ICD-10-CM

## 2021-08-06 DIAGNOSIS — M6281 Muscle weakness (generalized): Secondary | ICD-10-CM

## 2021-08-06 DIAGNOSIS — M546 Pain in thoracic spine: Secondary | ICD-10-CM

## 2021-08-06 DIAGNOSIS — M542 Cervicalgia: Secondary | ICD-10-CM | POA: Diagnosis not present

## 2021-08-06 DIAGNOSIS — M47814 Spondylosis without myelopathy or radiculopathy, thoracic region: Secondary | ICD-10-CM | POA: Diagnosis not present
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
## 2021-08-12 DIAGNOSIS — I10 Essential (primary) hypertension: Secondary | ICD-10-CM | POA: Diagnosis not present

## 2021-08-12 DIAGNOSIS — E1065 Type 1 diabetes mellitus with hyperglycemia: Secondary | ICD-10-CM | POA: Diagnosis not present

## 2021-08-12 DIAGNOSIS — E1140 Type 2 diabetes mellitus with diabetic neuropathy, unspecified: Secondary | ICD-10-CM | POA: Diagnosis not present

## 2021-08-12 DIAGNOSIS — K3184 Gastroparesis: Secondary | ICD-10-CM | POA: Diagnosis not present

## 2021-08-12 DIAGNOSIS — E041 Nontoxic single thyroid nodule: Secondary | ICD-10-CM | POA: Diagnosis not present

## 2021-08-12 DIAGNOSIS — E7800 Pure hypercholesterolemia, unspecified: Secondary | ICD-10-CM | POA: Diagnosis not present

## 2021-08-21 ENCOUNTER — Ambulatory Visit: Payer: Medicare Other | Admitting: Specialist

## 2021-08-24 DIAGNOSIS — F17200 Nicotine dependence, unspecified, uncomplicated: Secondary | ICD-10-CM | POA: Diagnosis not present

## 2021-08-24 DIAGNOSIS — I10 Essential (primary) hypertension: Secondary | ICD-10-CM | POA: Diagnosis not present

## 2021-08-24 DIAGNOSIS — E1065 Type 1 diabetes mellitus with hyperglycemia: Secondary | ICD-10-CM | POA: Diagnosis not present

## 2021-08-24 DIAGNOSIS — G894 Chronic pain syndrome: Secondary | ICD-10-CM | POA: Diagnosis not present

## 2021-08-24 DIAGNOSIS — J0100 Acute maxillary sinusitis, unspecified: Secondary | ICD-10-CM | POA: Diagnosis not present

## 2021-08-24 DIAGNOSIS — L650 Telogen effluvium: Secondary | ICD-10-CM | POA: Diagnosis not present

## 2021-09-01 DIAGNOSIS — I10 Essential (primary) hypertension: Secondary | ICD-10-CM | POA: Diagnosis not present

## 2021-09-01 DIAGNOSIS — E1065 Type 1 diabetes mellitus with hyperglycemia: Secondary | ICD-10-CM | POA: Diagnosis not present

## 2021-09-01 DIAGNOSIS — E7800 Pure hypercholesterolemia, unspecified: Secondary | ICD-10-CM | POA: Diagnosis not present

## 2021-10-01 DIAGNOSIS — E1042 Type 1 diabetes mellitus with diabetic polyneuropathy: Secondary | ICD-10-CM | POA: Diagnosis not present

## 2021-10-01 DIAGNOSIS — E7800 Pure hypercholesterolemia, unspecified: Secondary | ICD-10-CM | POA: Diagnosis not present

## 2021-10-01 DIAGNOSIS — I10 Essential (primary) hypertension: Secondary | ICD-10-CM | POA: Diagnosis not present

## 2021-10-27 ENCOUNTER — Other Ambulatory Visit: Payer: Self-pay | Admitting: Specialist

## 2021-10-27 DIAGNOSIS — S39012A Strain of muscle, fascia and tendon of lower back, initial encounter: Secondary | ICD-10-CM

## 2021-10-27 DIAGNOSIS — M546 Pain in thoracic spine: Secondary | ICD-10-CM

## 2021-10-30 DIAGNOSIS — E1065 Type 1 diabetes mellitus with hyperglycemia: Secondary | ICD-10-CM | POA: Diagnosis not present

## 2021-10-30 DIAGNOSIS — E7800 Pure hypercholesterolemia, unspecified: Secondary | ICD-10-CM | POA: Diagnosis not present

## 2021-10-30 DIAGNOSIS — I10 Essential (primary) hypertension: Secondary | ICD-10-CM | POA: Diagnosis not present

## 2021-11-05 ENCOUNTER — Ambulatory Visit: Payer: Medicare Other | Admitting: Specialist

## 2021-11-12 ENCOUNTER — Encounter: Payer: Self-pay | Admitting: Specialist

## 2021-11-12 ENCOUNTER — Ambulatory Visit: Payer: Medicare Other | Admitting: Specialist

## 2021-11-12 VITALS — BP 153/78 | HR 65 | Ht 65.0 in | Wt 139.5 lb

## 2021-11-12 DIAGNOSIS — M7582 Other shoulder lesions, left shoulder: Secondary | ICD-10-CM

## 2021-11-12 DIAGNOSIS — M778 Other enthesopathies, not elsewhere classified: Secondary | ICD-10-CM

## 2021-11-12 DIAGNOSIS — S161XXA Strain of muscle, fascia and tendon at neck level, initial encounter: Secondary | ICD-10-CM

## 2021-11-12 DIAGNOSIS — M47814 Spondylosis without myelopathy or radiculopathy, thoracic region: Secondary | ICD-10-CM | POA: Diagnosis not present

## 2021-11-12 DIAGNOSIS — M546 Pain in thoracic spine: Secondary | ICD-10-CM

## 2021-11-12 DIAGNOSIS — S39012A Strain of muscle, fascia and tendon of lower back, initial encounter: Secondary | ICD-10-CM

## 2021-11-12 DIAGNOSIS — M542 Cervicalgia: Secondary | ICD-10-CM | POA: Diagnosis not present

## 2021-11-12 MED ORDER — NAPROXEN 500 MG PO TABS: 500.0000 mg | ORAL_TABLET | Freq: Two times a day (BID) | ORAL | 1 refills | Status: DC
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
## 2021-11-12 NOTE — Patient Instructions (Addendum)
Avoid frequent bending and stooping  ?No lifting greater than 10 lbs. ?May use ice or moist heat for pain. ?Weight loss is of benefit. ?Best medication for lumbar disc disease is arthritis medications like motrin, celebrex and naprosyn. ?Exercise is important to improve your indurance and does allow people to function better inspite of back pain. ?Avoid overhead lifting and overhead use of the arms. ?Do not lift greater than 5-10 lbs. ?Adjust head rest in vehicle to prevent hyperextension if rear ended. ?Take extra precautions to avoid falling. ?Therapy for cervical spine and left shoulder. ?Try iontophoresis and left shoulder ROM stretching and strenghening.  ?Avoid overhead lifting and overhead use of the arms. ?Pillows to keep from sleeping directly on the shoulders ?Limited lifting to less than 10 lbs. ?Ice or heat for relief. ?NSAIDs are helpful, such as alleve or motrin, be careful not to use in excess as they place burdens on the kidney. ?Stretching exercise help and strengthening is helpful to build endurance.  ? ? ?

## 2021-11-23 DIAGNOSIS — R300 Dysuria: Secondary | ICD-10-CM | POA: Diagnosis not present

## 2021-11-23 DIAGNOSIS — G894 Chronic pain syndrome: Secondary | ICD-10-CM | POA: Diagnosis not present

## 2021-11-23 DIAGNOSIS — I700 Atherosclerosis of aorta: Secondary | ICD-10-CM | POA: Diagnosis not present

## 2021-11-23 DIAGNOSIS — N390 Urinary tract infection, site not specified: Secondary | ICD-10-CM | POA: Diagnosis not present

## 2021-11-23 DIAGNOSIS — E7800 Pure hypercholesterolemia, unspecified: Secondary | ICD-10-CM | POA: Diagnosis not present

## 2021-11-23 DIAGNOSIS — I10 Essential (primary) hypertension: Secondary | ICD-10-CM | POA: Diagnosis not present

## 2021-11-23 DIAGNOSIS — E1042 Type 1 diabetes mellitus with diabetic polyneuropathy: Secondary | ICD-10-CM | POA: Diagnosis not present

## 2021-11-23 DIAGNOSIS — I2510 Atherosclerotic heart disease of native coronary artery without angina pectoris: Secondary | ICD-10-CM | POA: Diagnosis not present

## 2021-11-23 DIAGNOSIS — K219 Gastro-esophageal reflux disease without esophagitis: Secondary | ICD-10-CM | POA: Diagnosis not present

## 2021-11-24 ENCOUNTER — Other Ambulatory Visit: Payer: Self-pay | Admitting: Family Medicine

## 2021-11-24 DIAGNOSIS — Z1231 Encounter for screening mammogram for malignant neoplasm of breast: Secondary | ICD-10-CM

## 2021-11-27 DIAGNOSIS — E1065 Type 1 diabetes mellitus with hyperglycemia: Secondary | ICD-10-CM | POA: Diagnosis not present

## 2021-11-27 DIAGNOSIS — I10 Essential (primary) hypertension: Secondary | ICD-10-CM | POA: Diagnosis not present

## 2021-12-10 DIAGNOSIS — E103292 Type 1 diabetes mellitus with mild nonproliferative diabetic retinopathy without macular edema, left eye: Secondary | ICD-10-CM | POA: Diagnosis not present

## 2021-12-14 ENCOUNTER — Ambulatory Visit: Payer: Medicare HMO | Admitting: Specialist

## 2021-12-16 NOTE — Therapy (Incomplete)
OUTPATIENT PHYSICAL THERAPY CERVICAL EVALUATION   Patient Name: Mallory Mendez MRN: [id number] DOB:[birth year], 66 y.o., female Today's Date: 12/16/2021    Past Medical History:  Diagnosis Date   Anxiety    Arthritis    Blood transfusion    Brain injury ([place])    1 1/2 YR AGO   Chronic constipation    Chronic stomach ulcer    Concussion    HAS HAD DIZZINESS/ MEMORY PROBLEMS/ SLOW SPEACH /BALANCE PROBLEMS SINCE CONCUSSION 1 1/2 YR AGO PR GOES TO THERAPY   Concussion    Depression    Diabetes mellitus    Diabetes mellitus    Dizziness    Fibromyalgia    Full dentures    GERD (gastroesophageal reflux disease)    Headache(784.0)    Hernia    incisional   History of back surgery    multiple   Hyperlipidemia    Hypertension    MRSA (methicillin resistant Staphylococcus aureus)    Nocturia    Paroxysmal vertigo    Scar    Sleep difficulties    Stroke ([place])    crica [id number]   Umbilical hernia    Wears glasses    Past Surgical History:  Procedure Laterality Date   ABDOMINAL HYSTERECTOMY     APPENDECTOMY     BACK SURGERY     SPINAL FUSION X6    CARDIAC CATHETERIZATION N/A 12/23/2015   Procedure: Left Heart Cath and Coronary Angiography;  Surgeon: Adrian Prows, MD;  Location: [facility];  Service: Cardiovascular;  Laterality: N/A;   CEREBRAL ANEURYSM REPAIR     CHOLECYSTECTOMY  1984   EYE SURGERY  2010   bilateral   HERNIA REPAIR     09/21/2010   HERNIA REPAIR  05/19/11   incisional hernia repair    INCISIONAL HERNIA REPAIR  05/19/2011   Procedure: HERNIA REPAIR INCISIONAL;  Surgeon: Willey Blade, MD;  Location: [facility];  Service: General;  Laterality: N/A;  repair incisional hernia with mesh   IR ANGIO INTRA EXTRACRAN SEL COM CAROTID INNOMINATE BILAT MOD SED  05/09/2018   IR ANGIO VERTEBRAL SEL VERTEBRAL BILAT MOD SED  05/09/2018   IR GENERIC HISTORICAL  09/30/2016   IR ANGIO VERTEBRAL SEL VERTEBRAL BILAT MOD SED 09/30/2016 Luanne Bras, MD [facility]   IR GENERIC HISTORICAL  09/30/2016   IR ANGIO INTRA EXTRACRAN SEL COM CAROTID INNOMINATE BILAT MOD SED 09/30/2016 Luanne Bras, MD [facility]   LEFT HEART CATH AND CORONARY ANGIOGRAPHY N/A 05/22/2019   Procedure: LEFT HEART CATH AND CORONARY ANGIOGRAPHY;  Surgeon: Adrian Prows, MD;  Location: [facility];  Service: Cardiovascular;  Laterality: N/A;   LEFT HEART CATHETERIZATION WITH CORONARY ANGIOGRAM N/A 11/05/2013   Procedure: LEFT HEART CATHETERIZATION WITH CORONARY ANGIOGRAM;  Surgeon: Laverda Page, MD;  Location[facility];  Service: Cardiovascular;  Laterality: N/A;   SPINAL FUSION  2006   TUBAL LIGATION     Patient Active Problem List   Diagnosis Date Noted   CAD (coronary artery disease), native coronary artery 12/23/2015   Angina pectoris ([place]) 12/22/2015   Chest pain 10/30/2013   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  Rationale for Evaluation and Treatment {HABREHAB:27488}  ONSET DATE: ***  SUBJECTIVE:                                                                                                                                                                                                         SUBJECTIVE STATEMENT: ***  PERTINENT HISTORY:  ***  PAIN:  Are you having pain? {OPRCPAIN:27236}  PRECAUTIONS: {Therapy precautions:24002}  WEIGHT BEARING RESTRICTIONS {Yes  ***/No:24003}  FALLS:  Has patient fallen in last 6 months? {fallsyesno:27318}  LIVING ENVIRONMENT: Lives with: {OPRC lives with:25569::"lives with their family"} Lives in: {Lives in:25570} Stairs: {opstairs:27293} Has following equipment at home: {Assistive devices:23999}  OCCUPATION: ***  PLOF: {PLOF:24004}  PATIENT GOALS ***  OBJECTIVE:   DIAGNOSTIC FINDINGS:  ***  PATIENT SURVEYS:  {rehab surveys:24030}   COGNITION: Overall cognitive status: {cognition:24006}   SENSATION: {sensation:27233}  POSTURE: {posture:25561}  PALPATION: ***   CERVICAL ROM:   {AROM/PROM:27142} ROM A/PROM (deg) eval  Flexion   Extension   Right lateral flexion   Left lateral flexion   Right rotation   Left rotation    (Blank rows = not tested)  UPPER EXTREMITY ROM:  {AROM/PROM:27142} ROM Right eval Left eval  Shoulder flexion    Shoulder extension    Shoulder abduction    Shoulder adduction    Shoulder extension    Shoulder internal rotation    Shoulder external rotation    Elbow flexion    Elbow extension    Wrist flexion    Wrist extension    Wrist ulnar deviation    Wrist radial deviation    Wrist pronation    Wrist supination     (Blank rows = not tested)  UPPER EXTREMITY MMT:  MMT Right eval Left eval  Shoulder flexion    Shoulder extension    Shoulder abduction    Shoulder adduction    Shoulder extension    Shoulder internal rotation    Shoulder external rotation    Middle trapezius    Lower trapezius    Elbow flexion    Elbow extension    Wrist flexion    Wrist extension    Wrist ulnar deviation    Wrist radial deviation    Wrist pronation    Wrist supination    Grip strength     (  Blank rows = not tested)  CERVICAL SPECIAL TESTS:  {Cervical special tests:25246}   FUNCTIONAL TESTS:  {Functional tests:24029}  PATIENT SURVEYS:  {rehab surveys:24030:a}  TODAY'S TREATMENT:  ***   PATIENT EDUCATION:  Education details:  *** Person educated: {Person educated:25204} Education method: {Education Method:25205} Education comprehension: {Education Comprehension:25206}   HOME EXERCISE PROGRAM: ***  ASSESSMENT:  CLINICAL IMPRESSION: Patient is a *** y.o. *** who was seen today for physical therapy evaluation and treatment for ***.    OBJECTIVE IMPAIRMENTS {opptimpairments:25111}.   ACTIVITY LIMITATIONS {activitylimitations:27494}  PARTICIPATION LIMITATIONS: {participationrestrictions:25113}  PERSONAL FACTORS {Personal factors:25162} are also affecting patient's functional outcome.   REHAB POTENTIAL: {rehabpotential:25112}  CLINICAL DECISION MAKING: {clinical decision making:25114}  EVALUATION COMPLEXITY: {Evaluation complexity:25115}   GOALS: Goals reviewed with patient? {yes/no:20286}  SHORT TERM GOALS: Target date: {follow up:25551}   *** Baseline: *** Goal status: {GOALSTATUS:25110}  2.  *** Baseline: *** Goal status: {GOALSTATUS:25110}  3.  *** Baseline: *** Goal status: {GOALSTATUS:25110}  4.  *** Baseline: *** Goal status: {GOALSTATUS:25110}  5.  *** Baseline: *** Goal status: {GOALSTATUS:25110}  6.  *** Baseline: *** Goal status: {GOALSTATUS:25110}  LONG TERM GOALS: Target date: {follow up:25551}  *** Baseline: *** Goal status: {GOALSTATUS:25110}  2.  *** Baseline: *** Goal status: {GOALSTATUS:25110}  3.  *** Baseline: *** Goal status: {GOALSTATUS:25110}  4.  *** Baseline: *** Goal status: {GOALSTATUS:25110}  5.  *** Baseline: *** Goal status: {GOALSTATUS:25110}  6.  *** Baseline: *** Goal status: {GOALSTATUS:25110}   PLAN: PT FREQUENCY: {rehab frequency:25116}  PT DURATION: {rehab duration:25117}  PLANNED INTERVENTIONS: {rehab planned interventions:25118::"Therapeutic exercises","Therapeutic activity","Neuromuscular re-education","Balance training","Gait training","Patient/Family education","Joint mobilization"}  PLAN FOR NEXT SESSION:  ***   Ardis Rowan, PT 12/16/2021, 12:13 PM

## 2021-12-17 ENCOUNTER — Ambulatory Visit (HOSPITAL_COMMUNITY): Payer: Medicare HMO | Attending: Specialist
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
## 2021-12-23 ENCOUNTER — Other Ambulatory Visit: Payer: Self-pay | Admitting: Specialist

## 2021-12-23 DIAGNOSIS — M546 Pain in thoracic spine: Secondary | ICD-10-CM

## 2021-12-23 DIAGNOSIS — E1065 Type 1 diabetes mellitus with hyperglycemia: Secondary | ICD-10-CM | POA: Diagnosis not present

## 2021-12-23 DIAGNOSIS — S39012A Strain of muscle, fascia and tendon of lower back, initial encounter: Secondary | ICD-10-CM

## 2022-01-01 ENCOUNTER — Other Ambulatory Visit: Payer: Self-pay | Admitting: Specialist

## 2022-01-01 DIAGNOSIS — E7800 Pure hypercholesterolemia, unspecified: Secondary | ICD-10-CM | POA: Diagnosis not present

## 2022-01-01 DIAGNOSIS — I10 Essential (primary) hypertension: Secondary | ICD-10-CM | POA: Diagnosis not present

## 2022-01-01 DIAGNOSIS — E1042 Type 1 diabetes mellitus with diabetic polyneuropathy: Secondary | ICD-10-CM | POA: Diagnosis not present

## 2022-01-01 DIAGNOSIS — I2510 Atherosclerotic heart disease of native coronary artery without angina pectoris: Secondary | ICD-10-CM | POA: Diagnosis not present

## 2022-01-01 MED ORDER — IBUPROFEN 800 MG PO TABS: 800.0000 mg | ORAL_TABLET | Freq: Three times a day (TID) | ORAL | 0 refills | Status: DC | PRN

## 2022-01-01 NOTE — Telephone Encounter (Signed)
Patient is requesting Ibupro[facility] it [facility] be delivered by Monday preferably, said Dr. Louanne Skye prescribed it some time about and after it ran out she tried using OTC medication but it was not as helpful, said she meant to ask when she last visited the office but forgot please advise

## 2022-01-01 NOTE — Addendum Note (Signed)
Addended by: Minda Ditto, Alyse Low N on: 01/01/2022 02:02 PM   Modules accepted: Orders

## 2022-01-23 DIAGNOSIS — E1065 Type 1 diabetes mellitus with hyperglycemia: Secondary | ICD-10-CM | POA: Diagnosis not present

## 2022-01-27 DIAGNOSIS — H524 Presbyopia: Secondary | ICD-10-CM | POA: Diagnosis not present

## 2022-01-28 DIAGNOSIS — E1042 Type 1 diabetes mellitus with diabetic polyneuropathy: Secondary | ICD-10-CM | POA: Diagnosis not present

## 2022-01-28 DIAGNOSIS — I2510 Atherosclerotic heart disease of native coronary artery without angina pectoris: Secondary | ICD-10-CM | POA: Diagnosis not present

## 2022-01-28 DIAGNOSIS — E7800 Pure hypercholesterolemia, unspecified: Secondary | ICD-10-CM | POA: Diagnosis not present

## 2022-01-28 DIAGNOSIS — I10 Essential (primary) hypertension: Secondary | ICD-10-CM | POA: Diagnosis not present

## 2022-03-02 DIAGNOSIS — E7800 Pure hypercholesterolemia, unspecified: Secondary | ICD-10-CM | POA: Diagnosis not present

## 2022-03-02 DIAGNOSIS — E1042 Type 1 diabetes mellitus with diabetic polyneuropathy: Secondary | ICD-10-CM | POA: Diagnosis not present

## 2022-03-02 DIAGNOSIS — I10 Essential (primary) hypertension: Secondary | ICD-10-CM | POA: Diagnosis not present

## 2022-03-04 DIAGNOSIS — F17200 Nicotine dependence, unspecified, uncomplicated: Secondary | ICD-10-CM | POA: Diagnosis not present

## 2022-03-04 DIAGNOSIS — I10 Essential (primary) hypertension: Secondary | ICD-10-CM | POA: Diagnosis not present

## 2022-03-04 DIAGNOSIS — E7800 Pure hypercholesterolemia, unspecified: Secondary | ICD-10-CM | POA: Diagnosis not present

## 2022-03-04 DIAGNOSIS — I2510 Atherosclerotic heart disease of native coronary artery without angina pectoris: Secondary | ICD-10-CM | POA: Diagnosis not present

## 2022-03-04 DIAGNOSIS — E1042 Type 1 diabetes mellitus with diabetic polyneuropathy: Secondary | ICD-10-CM | POA: Diagnosis not present

## 2022-03-04 DIAGNOSIS — G894 Chronic pain syndrome: Secondary | ICD-10-CM | POA: Diagnosis not present

## 2022-03-04 DIAGNOSIS — E1065 Type 1 diabetes mellitus with hyperglycemia: Secondary | ICD-10-CM | POA: Diagnosis not present

## 2022-03-04 DIAGNOSIS — R300 Dysuria: Secondary | ICD-10-CM | POA: Diagnosis not present

## 2022-03-04 DIAGNOSIS — F0781 Postconcussional syndrome: Secondary | ICD-10-CM | POA: Diagnosis not present

## 2022-03-12 DIAGNOSIS — F5104 Psychophysiologic insomnia: Secondary | ICD-10-CM | POA: Diagnosis not present

## 2022-03-12 DIAGNOSIS — G4719 Other hypersomnia: Secondary | ICD-10-CM | POA: Diagnosis not present

## 2022-03-29 ENCOUNTER — Other Ambulatory Visit: Payer: Self-pay | Admitting: Specialist

## 2022-03-29 DIAGNOSIS — M546 Pain in thoracic spine: Secondary | ICD-10-CM

## 2022-03-29 DIAGNOSIS — S39012A Strain of muscle, fascia and tendon of lower back, initial encounter: Secondary | ICD-10-CM

## 2022-03-31 NOTE — Progress Notes (Signed)
Mallory Mendez Date of Birth: October 24, 1955 MRN: [id number] Primary Care Provider:Harris, Gwyndolyn Saxon, MD Former Cardiology Providers: Jeri Lager, APRN, FNP-C Primary Cardiologist: Rex Kras, DO, [facility] (established care 05/02/2020)  Date: 04/01/22 Last Office Visit: 05/02/2020  Chief Complaint  Patient presents with   Coronary Artery Disease   Follow-up    HPI  Mallory Mendez is a 66 y.o.  female whose past medical history and cardiovascular risk factors include: Type 1 diabetes mellitus, hypertension, hyperlipidemia, history of cerebral aneurysm repair in 2012, continued tobacco abuse, moderate diffuse CAD by angiogram in 2017, postmenopausal female.  Patient was last seen in the office in October 2021 and then lost to follow-up.  Now referred back to cardiology for management of blood pressures.  In the past she was being followed by the practice for her underlying CAD.  Patient states that her home blood pressures recently has been running 160-174 mmHg.  Over the last 2 days she has not taken losartan because she thinks it is causing her to feel tired and fatigue.  She is also on a clonidine patch that she did not place prior to today's visit. In the past her blood pressures were being managed by her PCP and she was enrolled into RPM through their practice.  However due to it not being cost effective she is no longer being followed by the clinic.  She denies anginal discomfort or heart failure symptoms.  FUNCTIONAL STATUS: No structured exercise program or daily routine.  ALLERGIES: Allergies  Allergen Reactions   Cymbalta [Duloxetine Hcl] Anaphylaxis   Latex Anaphylaxis and Hives     MEDICATION LIST PRIOR TO VISIT: Current Outpatient Medications on File Prior to Visit  Medication Sig Dispense Refill   ALPRAZolam (XANAX) 1 MG tablet Take 1 mg by mouth 3 (three) times daily as needed for anxiety.      amLODipine (NORVASC) 10 MG tablet Take 1 tablet (10 mg total) by mouth  daily. 90 tablet 1   aspirin 81 MG chewable tablet Chew 81 mg by mouth daily.     atorvastatin (LIPITOR) 40 MG tablet Take 1 tablet (40 mg total) by mouth daily. 90 tablet 1   esomeprazole (NEXIUM) 40 MG capsule Take 40 mg by mouth every evening.      estradiol (ESTRACE) 1 MG tablet Take 1 mg by mouth daily.     gabapentin (NEURONTIN) 100 MG capsule TAKE ONE CAPSULE BY MOUTH EVERYDAY AT BEDTIME 30 capsule 1   HUMALOG KWIKPEN 100 UNIT/ML KwikPen SMARTSIG:10-20 Unit(s) SUB-Q 3 Times Daily     ibuprofen (ADVIL) 800 MG tablet Take 1 tablet (800 mg total) by mouth every 8 (eight) hours as needed. 60 tablet 0   insulin glargine (LANTUS) 100 UNIT/ML injection Inject 63 Units into the skin 2 (two) times daily. 50 units in the morning, and 10 units at bedtime     levocetirizine (XYZAL) 5 MG tablet Take 5 mg by mouth every evening.     losartan-hydrochlorothiazide (HYZAAR) 100-25 MG tablet Take 1 tablet by mouth every morning.     meclizine (ANTIVERT) 25 MG tablet Take 25 mg by mouth daily as needed.     methocarbamol (ROBAXIN) 500 MG tablet Take 1 tablet (500 mg total) by mouth 2 (two) times daily as needed. 20 tablet 0   metoprolol succinate (TOPROL-XL) 50 MG 24 hr tablet Take 1 tablet (50 mg total) by mouth daily. Take with or immediately following a meal. 90 tablet 1   naproxen (NAPROSYN) 500 MG tablet  Take 1 tablet (500 mg total) by mouth 2 (two) times daily with a meal. 60 tablet 1   nitroGLYCERIN (NITROSTAT) 0.4 MG SL tablet Place 0.4 mg under the tongue every 5 (five) minutes as needed for chest pain.     oxycodone (ROXICODONE) 30 MG immediate release tablet Take 30 mg by mouth every 12 (twelve) hours as needed for pain.     predniSONE (STERAPRED UNI-PAK 21 TAB) 10 MG (21) TBPK tablet Take as directed 21 tablet 0   zolpidem (AMBIEN) 10 MG tablet Take 10 mg by mouth at bedtime as needed for sleep.     isosorbide mononitrate (IMDUR) 30 MG 24 hr tablet Take 1 tablet (30 mg total) by mouth daily. 90  tablet 1   No current facility-administered medications on file prior to visit.    PAST MEDICAL HISTORY: Past Medical History:  Diagnosis Date   Anxiety    Arthritis    Blood transfusion    Brain injury ([place])    1 1/2 YR AGO   Chronic constipation    Chronic stomach ulcer    Concussion    HAS HAD DIZZINESS/ MEMORY PROBLEMS/ SLOW SPEACH /BALANCE PROBLEMS SINCE CONCUSSION 1 1/2 YR AGO PR GOES TO THERAPY   Concussion    Depression    Diabetes mellitus    Diabetes mellitus    Dizziness    Fibromyalgia    Full dentures    GERD (gastroesophageal reflux disease)    Headache(784.0)    Hernia    incisional   History of back surgery    multiple   Hyperlipidemia    Hypertension    MRSA (methicillin resistant Staphylococcus aureus)    Nocturia    Paroxysmal vertigo    Scar    Sleep difficulties    Stroke ([place])    crica [id number]   Umbilical hernia    Wears glasses     PAST SURGICAL HISTORY: Past Surgical History:  Procedure Laterality Date   ABDOMINAL HYSTERECTOMY     APPENDECTOMY     BACK SURGERY     SPINAL FUSION X6    CARDIAC CATHETERIZATION N/A 12/23/2015   Procedure: Left Heart Cath and Coronary Angiography;  Surgeon: Adrian Prows, MD;  Location: [facility];  Service: Cardiovascular;  Laterality: N/A;   CEREBRAL ANEURYSM REPAIR     CHOLECYSTECTOMY  1984   EYE SURGERY  2010   bilateral   HERNIA REPAIR     09/21/2010   HERNIA REPAIR  05/19/11   incisional hernia repair    INCISIONAL HERNIA REPAIR  05/19/2011   Procedure: HERNIA REPAIR INCISIONAL;  Surgeon: Willey Blade, MD;  Location: [facility];  Service: General;  Laterality: N/A;  repair incisional hernia with mesh   IR ANGIO INTRA EXTRACRAN SEL COM CAROTID INNOMINATE BILAT MOD SED  05/09/2018   IR ANGIO VERTEBRAL SEL VERTEBRAL BILAT MOD SED  05/09/2018   IR GENERIC HISTORICAL  09/30/2016   IR ANGIO VERTEBRAL SEL VERTEBRAL BILAT MOD SED 09/30/2016 Luanne Bras, MD [facility]   IR GENERIC HISTORICAL   09/30/2016   IR ANGIO INTRA EXTRACRAN SEL COM CAROTID INNOMINATE BILAT MOD SED 09/30/2016 Luanne Bras, MD [facility]   LEFT HEART CATH AND CORONARY ANGIOGRAPHY N/A 05/22/2019   Procedure: LEFT HEART CATH AND CORONARY ANGIOGRAPHY;  Surgeon: Adrian Prows, MD;  Location: [facility];  Service: Cardiovascular;  Laterality: N/A;   LEFT HEART CATHETERIZATION WITH CORONARY ANGIOGRAM N/A 11/05/2013   Procedure: LEFT HEART CATHETERIZATION WITH CORONARY  ANGIOGRAM;  Surgeon: Laverda Page, MD;  Location: [facility];  Service: Cardiovascular;  Laterality: N/A;   SPINAL FUSION  2006   TUBAL LIGATION      FAMILY HISTORY: The patient's family history includes Allergies in her daughter; Breast cancer in her mother; Heart disease in her father.   SOCIAL HISTORY:  The patient  reports that she has been smoking cigarettes. She has a 19.00 pack-year smoking history. She has never used smokeless tobacco. She reports that she does not drink alcohol and does not use drugs.  Review of Systems  Constitutional: Positive for malaise/fatigue.  Cardiovascular:  Negative for chest pain, claudication, dyspnea on exertion, irregular heartbeat, leg swelling, near-syncope, orthopnea, palpitations, paroxysmal nocturnal dyspnea and syncope.  Respiratory:  Negative for shortness of breath.   Hematologic/Lymphatic: Negative for bleeding problem.  Musculoskeletal:  Negative for muscle cramps and myalgias.  Neurological:  Positive for light-headedness (chronic and stable). Negative for dizziness.    PHYSICAL EXAM:    04/01/2022    1:25 PM 04/01/2022    1:17 PM 11/12/2021   10:35 AM  Vitals with BMI  Height  '5\' 5"'$  '5\' 5"'$   Weight  154 lbs 139 lbs 8 oz  BMI  09.81 19.14  Systolic 782 956 213  Diastolic 086 99 78  Pulse 66 66 65   Physical Exam  Constitutional: No distress.  Age appropriate, hemodynamically stable.   Neck: No JVD present.  Cardiovascular: Normal rate, regular rhythm, S1 normal, S2 normal,  intact distal pulses and normal pulses. Exam reveals no gallop, no S3 and no S4.  No murmur heard. Pulmonary/Chest: Effort normal and breath sounds normal. No stridor. She has no wheezes. She has no rales.  Abdominal: Soft. Bowel sounds are normal. She exhibits no distension. There is no abdominal tenderness.  Musculoskeletal:        General: No edema.     Cervical back: Neck supple.  Neurological: She is alert and oriented to person, place, and time. She has intact cranial nerves (2-12).  Skin: Skin is warm and moist.   CARDIAC DATABASE: EKG: 05/02/2020: Sinus bradycardia, 56 bpm, normal axis, without underlying ischemia or injury pattern.  04/01/22: Normal sinus rhythm, 66 bpm, without underlying ischemia or injury pattern.  Echocardiogram: 05/18/2019: Left ventricle cavity is normal in size. Mild concentric hypertrophy of the left ventricle. Normal LV systolic function with EF 68%. Normal global wall motion. Doppler evidence of grade I (impaired) diastolic dysfunction, normal LAP. Left atrial cavity is mildly dilated. Trileaflet aortic valve with mildly restricted aortic valve leaflets. Trace aortic valve stenosis. Aortic valve mean gradient of 7 mmHg, Vmax of 2.0 m/s. Calculated aortic valve area by continuity equation is 1.6  cm. No regurgitation noted. Mild (Grade I) mitral regurgitation. Mild tricuspid regurgitation. No evidence of pulmonary hypertension. Compared to previous study in 2014, trace aortic stenosis is new.   Stress Testing:  Nuclear stress test 12/05/2015:  Patient exercised on Bruce protocol for 5.0  minutes and achieved 4.64 METS. Stress test terminated due to target heart rate( 1.1% MPHR) and dyspnea.  Left ventricular cavity is noted to be enlarged on the stress study.  The TID index was 1.41.  SPECT images demonstrate homogeneous tracer distribution throughout the myocardium.   Gated SPECT images reveal normal myocardial thickening and wall motion.  LVEF  visually estimated 63%.   Blood cells this is a high  risk study in view of abnormal EKG response and transient ischemic dilatation of LV noted on stress  images.  clinical correlation recommended.  Heart Catheterization: Cardiac catheterization 05/22/2019: Normal LV systolic function, normal LVEDP.  No wall motion abnormality. Left main: Distal left main shows about a 30% stenosis. LAD mild to moderate diffusely diseased vessel, in the proximal segment diffuse 20 to 30% stenosis.  Large D1, D2 and D3, mild ostial disease. Circumflex: High origin of OM1 from the proximal segment, bifurcation and proximal segment has a 40 to 50% stenosis followed by tandem mid 40 to 50% stenosis. RCA: PDA has a 40 to 50% stenosis, large vessel.  Carotid artery duplex 03/02/2017: No hemodynamically significant arterial disease in the internal carotid artery bilaterally. Antegrade right vertebral artery flow. Antegrade left vertebral artery flow.  LABORATORY DATA: External Labs: Collected: 03/04/2022 provided by primary team. Total cholesterol 161, triglycerides 130, HDL 43, LDL 96, non-HDL 119,. BUN 13, creatinine 0.6. Sodium 139, potassium 4.7, chloride 105, bicarb 30 AST 19, ALT 19, alkaline phosphatase 104. Hemoglobin 13.4, hematocrit 40%. A1c 9. TSH 0.93  IMPRESSION:    ICD-10-CM   1. Benign hypertension  I10 cloNIDine (CATAPRES) 0.1 MG tablet    2. Atherosclerosis of native coronary artery of native heart without angina pectoris  I25.10 EKG 12-Lead    PCV ECHOCARDIOGRAM COMPLETE    3. Mixed hyperlipidemia  E78.2     4. Type 1 diabetes mellitus with hyperglycemia (HCC)  E10.65     5. Long-term insulin use (HCC)  Z79.4     6. Tobacco use disorder  F17.200     7. Bilateral carotid bruits  R09.89 PCV CAROTID DUPLEX (BILATERAL)       RECOMMENDATIONS: Mallory Mendez is a 66 y.o. female whose past medical history and cardiovascular risk factors include: Insulin-dependent diabetes mellitus  type 1, hypertension, hyperlipidemia, history of cerebral aneurysm repair in 2012, continued tobacco abuse, moderate diffuse CAD by angiogram in 2017, postmenopausal female.  Benign hypertension Office blood pressures are not well controlled.   Likely secondary to stopping losartan and not restarting her clonidine patch. Patient is asked to restart her losartan starting tomorrow 04/02/2022. Since her blood pressures are elevated we will give her 1 tablet of Edarbyclor 40/25 mg at today's office visit.  She is currently on clonidine 0.2 mg patch weekly we will change her to clonidine 0.1 mg p.o. twice daily. Ideally would like to wean her off of clonidine. We will enroll her into RPM for blood pressure monitoring. Echo will be ordered to evaluate for structural heart disease and left ventricular systolic function. Patient has no pain between the shoulder blades, no evidence of aortic regurgitation on physical examination, and pulses in the upper and lower extremities are intact. Patient is asked to go to the ER if she has uncontrolled hypertension or has symptoms as mentioned above as these could be suggestive of aortic syndromes.  She verbalizes understanding.  Atherosclerosis of native coronary artery of native heart without angina pectoris Denies angina pectoris. EKG: Nonischemic. Echo will be ordered to evaluate for structural heart disease and left ventricular systolic function. Medications reconciled.  Mixed hyperlipidemia Currently on atorvastatin.   She denies myalgia or other side effects. Most recent lipids dated 03/04/2022, independently reviewed as noted above. LDL currently not at goal.  Patient is recommended to have LDL levels <70 mg/dL at least.  Type 1 diabetes mellitus with hyperglycemia ([place]) Reemphasized importance of glycemic control. Restart losartan. Continue statin therapy Currently being managed by other provider's in her care team  Tobacco use  disorder Reemphasized importance of complete smoking cessation  Bilateral carotid bruits Check carotid duplex  Continue aspirin and statin therapy Reemphasized the importance of complete smoking cessation   FINAL MEDICATION LIST END OF ENCOUNTER: Meds ordered this encounter  Medications   cloNIDine (CATAPRES) 0.1 MG tablet    Sig: Take 1 tablet (0.1 mg total) by mouth 2 (two) times daily.    Dispense:  60 tablet    Refill:  11    Current Outpatient Medications:    ALPRAZolam (XANAX) 1 MG tablet, Take 1 mg by mouth 3 (three) times daily as needed for anxiety. , Disp: , Rfl:    amLODipine (NORVASC) 10 MG tablet, Take 1 tablet (10 mg total) by mouth daily., Disp: 90 tablet, Rfl: 1   aspirin 81 MG chewable tablet, Chew 81 mg by mouth daily., Disp: , Rfl:    atorvastatin (LIPITOR) 40 MG tablet, Take 1 tablet (40 mg total) by mouth daily., Disp: 90 tablet, Rfl: 1   cloNIDine (CATAPRES) 0.1 MG tablet, Take 1 tablet (0.1 mg total) by mouth 2 (two) times daily., Disp: 60 tablet, Rfl: 11   esomeprazole (NEXIUM) 40 MG capsule, Take 40 mg by mouth every evening. , Disp: , Rfl:    estradiol (ESTRACE) 1 MG tablet, Take 1 mg by mouth daily., Disp: , Rfl:    gabapentin (NEURONTIN) 100 MG capsule, TAKE ONE CAPSULE BY MOUTH EVERYDAY AT BEDTIME, Disp: 30 capsule, Rfl: 1   HUMALOG KWIKPEN 100 UNIT/ML KwikPen, SMARTSIG:10-20 Unit(s) SUB-Q 3 Times Daily, Disp: , Rfl:    ibuprofen (ADVIL) 800 MG tablet, Take 1 tablet (800 mg total) by mouth every 8 (eight) hours as needed., Disp: 60 tablet, Rfl: 0   insulin glargine (LANTUS) 100 UNIT/ML injection, Inject 63 Units into the skin 2 (two) times daily. 50 units in the morning, and 10 units at bedtime, Disp: , Rfl:    levocetirizine (XYZAL) 5 MG tablet, Take 5 mg by mouth every evening., Disp: , Rfl:    losartan-hydrochlorothiazide (HYZAAR) 100-25 MG tablet, Take 1 tablet by mouth every morning., Disp: , Rfl:    meclizine (ANTIVERT) 25 MG tablet, Take 25 mg by  mouth daily as needed., Disp: , Rfl:    methocarbamol (ROBAXIN) 500 MG tablet, Take 1 tablet (500 mg total) by mouth 2 (two) times daily as needed., Disp: 20 tablet, Rfl: 0   metoprolol succinate (TOPROL-XL) 50 MG 24 hr tablet, Take 1 tablet (50 mg total) by mouth daily. Take with or immediately following a meal., Disp: 90 tablet, Rfl: 1   naproxen (NAPROSYN) 500 MG tablet, Take 1 tablet (500 mg total) by mouth 2 (two) times daily with a meal., Disp: 60 tablet, Rfl: 1   nitroGLYCERIN (NITROSTAT) 0.4 MG SL tablet, Place 0.4 mg under the tongue every 5 (five) minutes as needed for chest pain., Disp: , Rfl:    oxycodone (ROXICODONE) 30 MG immediate release tablet, Take 30 mg by mouth every 12 (twelve) hours as needed for pain., Disp: , Rfl:    predniSONE (STERAPRED UNI-PAK 21 TAB) 10 MG (21) TBPK tablet, Take as directed, Disp: 21 tablet, Rfl: 0   zolpidem (AMBIEN) 10 MG tablet, Take 10 mg by mouth at bedtime as needed for sleep., Disp: , Rfl:    isosorbide mononitrate (IMDUR) 30 MG 24 hr tablet, Take 1 tablet (30 mg total) by mouth daily., Disp: 90 tablet, Rfl: 1  Orders Placed This Encounter  Procedures   EKG 12-Lead   PCV ECHOCARDIOGRAM COMPLETE   PCV CAROTID DUPLEX (BILATERAL)   --Continue cardiac  medications as reconciled in final medication list. --Return in about 4 weeks (around 04/29/2022) for Follow up, BP, Review test results. Or sooner if needed. --Continue follow-up with your primary care physician regarding the management of your other chronic comorbid conditions.  There are no Patient Instructions on file for this visit.  Patient's questions and concerns were addressed to her satisfaction. She voices understanding of the instructions provided during this encounter.   This note was created using a voice recognition software as a result there may be grammatical errors inadvertently enclosed that do not reflect the nature of this encounter. Every attempt is made to correct such  errors.  Reviewing prior records, reviewing past office notes, outside labs provided by PCP reviewed, enrolled into remote patient monitoring, administered medications to help lower blood pressure and reevaluated her prior to discharge, discussed possible ED evaluation.    Rex Kras, [place], [facility]  Pager: [phone] Office: [phone]

## 2022-04-01 ENCOUNTER — Ambulatory Visit: Payer: Medicare HMO | Admitting: Cardiology

## 2022-04-01 ENCOUNTER — Encounter: Payer: Self-pay | Admitting: Cardiology

## 2022-04-01 VITALS — BP 200/100 | HR 66 | Temp 98.0°F | Resp 16 | Ht 65.0 in | Wt 154.0 lb

## 2022-04-01 DIAGNOSIS — E782 Mixed hyperlipidemia: Secondary | ICD-10-CM | POA: Diagnosis not present

## 2022-04-01 DIAGNOSIS — R0989 Other specified symptoms and signs involving the circulatory and respiratory systems: Secondary | ICD-10-CM

## 2022-04-01 DIAGNOSIS — I2510 Atherosclerotic heart disease of native coronary artery without angina pectoris: Secondary | ICD-10-CM | POA: Diagnosis not present

## 2022-04-01 DIAGNOSIS — Z794 Long term (current) use of insulin: Secondary | ICD-10-CM

## 2022-04-01 DIAGNOSIS — E1065 Type 1 diabetes mellitus with hyperglycemia: Secondary | ICD-10-CM | POA: Diagnosis not present

## 2022-04-01 DIAGNOSIS — F17200 Nicotine dependence, unspecified, uncomplicated: Secondary | ICD-10-CM

## 2022-04-01 DIAGNOSIS — I10 Essential (primary) hypertension: Secondary | ICD-10-CM

## 2022-04-01 MED ORDER — CLONIDINE HCL 0.1 MG PO TABS: 0.1000 mg | ORAL_TABLET | Freq: Two times a day (BID) | ORAL | 11 refills | Status: DC

## 2022-04-02 DIAGNOSIS — I2510 Atherosclerotic heart disease of native coronary artery without angina pectoris: Secondary | ICD-10-CM | POA: Diagnosis not present

## 2022-04-02 DIAGNOSIS — I10 Essential (primary) hypertension: Secondary | ICD-10-CM | POA: Diagnosis not present

## 2022-04-02 DIAGNOSIS — E7800 Pure hypercholesterolemia, unspecified: Secondary | ICD-10-CM | POA: Diagnosis not present

## 2022-04-02 DIAGNOSIS — E1042 Type 1 diabetes mellitus with diabetic polyneuropathy: Secondary | ICD-10-CM | POA: Diagnosis not present

## 2022-04-09 DIAGNOSIS — I10 Essential (primary) hypertension: Secondary | ICD-10-CM | POA: Diagnosis not present

## 2022-04-12 ENCOUNTER — Telehealth: Payer: Self-pay

## 2022-04-12 NOTE — Telephone Encounter (Signed)
Follow-up on readings from new start of RPM. Patient has stopped taking the clonidine patch. [facility] sent the clonidine patches instead of the oral tablets in this last fill of medications. I spoke with Ariel at [employer] and they are going to send the clonidine tablets this afternoon. Will follow-up with patient to let her know to expect them.  Patient is experiencing rebound hypertension. Denies headaches, chest pain, palpitations, or syncope symptoms.   04/12/2022 Monday at 09:57 AM 226 / 110      04/12/2022 Monday at 09:51 AM 230 / 94      04/11/2022 'Sunday at 09:15 PM 157 / 95      04/10/2022 Saturday at 04:27 PM 148 / 80      04/09/2022 Friday at 11:43 AM 162 / 103      04/07/2022 Wednesday at 07:48 PM 153 / 85      04/06/2022 Tuesday at 05:07 PM 144 / 90      04/05/2022 Monday at 02:48 PM 124 / 70      04/05/2022 Monday at 02:44 PM 125 / 73      04/04/2022 Sunday at 04:36 PM 134 / 96      04/02/2022 Friday at 03:47 PM 124 / 72      09'$ /28/2023 Thursday at 02:18 PM 177 / 106

## 2022-04-12 NOTE — Telephone Encounter (Signed)
Yes, get her on lower dose of clonidine (so we can wean her off) and monitor for now.  Update in 1 week.   Dr. Terri Skains

## 2022-04-27 ENCOUNTER — Ambulatory Visit: Payer: Medicare HMO

## 2022-04-27 ENCOUNTER — Other Ambulatory Visit: Payer: Self-pay

## 2022-04-27 DIAGNOSIS — I2510 Atherosclerotic heart disease of native coronary artery without angina pectoris: Secondary | ICD-10-CM

## 2022-04-27 DIAGNOSIS — R0989 Other specified symptoms and signs involving the circulatory and respiratory systems: Secondary | ICD-10-CM | POA: Diagnosis not present

## 2022-04-27 DIAGNOSIS — I10 Essential (primary) hypertension: Secondary | ICD-10-CM

## 2022-04-27 MED ORDER — CLONIDINE HCL 0.1 MG PO TABS: ORAL_TABLET | ORAL | 0 refills | Status: DC

## 2022-04-27 MED ORDER — LABETALOL HCL 100 MG PO TABS: 100.0000 mg | ORAL_TABLET | Freq: Two times a day (BID) | ORAL | 2 refills | Status: DC

## 2022-04-27 NOTE — Progress Notes (Signed)
Per discussion with Dr. Terri Skains - plan is to decrease PO clonidine 0.'1mg'$  BID to daily for 1 week then stop. Toprol XL to change to labetalol '100mg'$  BID.

## 2022-05-03 DIAGNOSIS — E1042 Type 1 diabetes mellitus with diabetic polyneuropathy: Secondary | ICD-10-CM | POA: Diagnosis not present

## 2022-05-03 DIAGNOSIS — H5231 Anisometropia: Secondary | ICD-10-CM | POA: Diagnosis not present

## 2022-05-03 DIAGNOSIS — I10 Essential (primary) hypertension: Secondary | ICD-10-CM | POA: Diagnosis not present

## 2022-05-03 DIAGNOSIS — E7800 Pure hypercholesterolemia, unspecified: Secondary | ICD-10-CM | POA: Diagnosis not present

## 2022-05-03 DIAGNOSIS — Z0100 Encounter for examination of eyes and vision without abnormal findings: Secondary | ICD-10-CM | POA: Diagnosis not present

## 2022-05-10 DIAGNOSIS — I10 Essential (primary) hypertension: Secondary | ICD-10-CM | POA: Diagnosis not present

## 2022-05-11 ENCOUNTER — Ambulatory Visit: Payer: Medicare HMO | Admitting: Cardiology

## 2022-05-11 ENCOUNTER — Encounter: Payer: Self-pay | Admitting: Cardiology

## 2022-05-11 VITALS — BP 144/69 | HR 73 | Resp 16 | Ht 65.0 in | Wt 154.6 lb

## 2022-05-11 DIAGNOSIS — Z794 Long term (current) use of insulin: Secondary | ICD-10-CM | POA: Diagnosis not present

## 2022-05-11 DIAGNOSIS — E782 Mixed hyperlipidemia: Secondary | ICD-10-CM

## 2022-05-11 DIAGNOSIS — E1065 Type 1 diabetes mellitus with hyperglycemia: Secondary | ICD-10-CM | POA: Diagnosis not present

## 2022-05-11 DIAGNOSIS — F17200 Nicotine dependence, unspecified, uncomplicated: Secondary | ICD-10-CM

## 2022-05-11 DIAGNOSIS — I2510 Atherosclerotic heart disease of native coronary artery without angina pectoris: Secondary | ICD-10-CM

## 2022-05-11 DIAGNOSIS — I10 Essential (primary) hypertension: Secondary | ICD-10-CM

## 2022-05-11 MED ORDER — HYDRALAZINE HCL 25 MG PO TABS: 25.0000 mg | ORAL_TABLET | Freq: Three times a day (TID) | ORAL | 0 refills | Status: DC

## 2022-05-11 NOTE — Progress Notes (Signed)
[name] Date of Birth: 21-Apr-1956 MRN: [id number] Primary Care Provider:Harris, Gwyndolyn Saxon, MD Former Cardiology Providers: Jeri Lager, APRN, FNP-C Primary Cardiologist: Rex Kras, DO, [facility] (established care 05/02/2020)  Date: 05/11/22 Last Office Visit: 04/01/2022  Chief Complaint  Patient presents with   Hypertension   Results   Follow-up    HPI  Mallory Mendez is a 66 y.o.  female whose past medical history and cardiovascular risk factors include: Type 1 diabetes mellitus, hypertension, hyperlipidemia, history of cerebral aneurysm repair in 2012, continued tobacco abuse, moderate diffuse CAD by angiogram in 2017, postmenopausal female.  Patient is noted to have moderate diffuse CAD per angiogram in 2017 but was lost to follow-up until recently (September 2023).  She presents to the office for blood pressure management as her SBP would range between 160-174 mmHg.  At last office visit she was enrolled into principal care management for ambulatory blood pressure monitoring and was recommended to wean off of clonidine.  Her home blood pressure averages around 163/91 with a pulse of 76 bpm.  Patient has not been very compliant with checking her blood pressures regularly.  Patient denies any angina factors, heart failure symptoms, vision changes, or focal neurological deficits.  FUNCTIONAL STATUS: No structured exercise program or daily routine.  ALLERGIES: Allergies  Allergen Reactions   Cymbalta [Duloxetine Hcl] Anaphylaxis   Latex Anaphylaxis and Hives     MEDICATION LIST PRIOR TO VISIT: Current Outpatient Medications on File Prior to Visit  Medication Sig Dispense Refill   ALPRAZolam (XANAX) 1 MG tablet Take 1 mg by mouth 3 (three) times daily as needed for anxiety.      amLODipine (NORVASC) 10 MG tablet Take 1 tablet (10 mg total) by mouth daily. 90 tablet 1   aspirin 81 MG chewable tablet Chew 81 mg by mouth daily.     atorvastatin (LIPITOR) 40 MG tablet  Take 1 tablet (40 mg total) by mouth daily. 90 tablet 1   esomeprazole (NEXIUM) 40 MG capsule Take 40 mg by mouth every evening.      estradiol (ESTRACE) 1 MG tablet Take 1 mg by mouth daily.     gabapentin (NEURONTIN) 100 MG capsule TAKE ONE CAPSULE BY MOUTH EVERYDAY AT BEDTIME (Patient taking differently: Take 100 mg by mouth as needed.) 30 capsule 1   HUMALOG KWIKPEN 100 UNIT/ML KwikPen SMARTSIG:10-20 Unit(s) SUB-Q 3 Times Daily     ibuprofen (ADVIL) 800 MG tablet Take 1 tablet (800 mg total) by mouth every 8 (eight) hours as needed. 60 tablet 0   insulin glargine (LANTUS) 100 UNIT/ML injection Inject 63 Units into the skin 2 (two) times daily. 50 units in the morning, and 10 units at bedtime     isosorbide mononitrate (IMDUR) 30 MG 24 hr tablet Take 1 tablet (30 mg total) by mouth daily. 90 tablet 1   labetalol (NORMODYNE) 100 MG tablet Take 1 tablet (100 mg total) by mouth 2 (two) times daily. 60 tablet 2   levocetirizine (XYZAL) 5 MG tablet Take 5 mg by mouth every evening.     losartan-hydrochlorothiazide (HYZAAR) 100-25 MG tablet Take 1 tablet by mouth every morning.     meclizine (ANTIVERT) 25 MG tablet Take 25 mg by mouth daily as needed.     naproxen (NAPROSYN) 500 MG tablet Take 1 tablet (500 mg total) by mouth 2 (two) times daily with a meal. (Patient taking differently: Take 500 mg by mouth as needed.) 60 tablet 1   nitroGLYCERIN (NITROSTAT) 0.4 MG SL  tablet Place 0.4 mg under the tongue every 5 (five) minutes as needed for chest pain.     oxycodone (ROXICODONE) 30 MG immediate release tablet Take 30 mg by mouth every 12 (twelve) hours as needed for pain.     ramelteon (ROZEREM) 8 MG tablet Take 8 mg by mouth at bedtime as needed.     zolpidem (AMBIEN) 10 MG tablet Take 10 mg by mouth at bedtime as needed for sleep.     No current facility-administered medications on file prior to visit.    PAST MEDICAL HISTORY: Past Medical History:  Diagnosis Date   Anxiety    Arthritis     Blood transfusion    Brain injury ([place])    1 1/2 YR AGO   Chronic constipation    Chronic stomach ulcer    Concussion    HAS HAD DIZZINESS/ MEMORY PROBLEMS/ SLOW SPEACH /BALANCE PROBLEMS SINCE CONCUSSION 1 1/2 YR AGO PR GOES TO THERAPY   Concussion    Depression    Diabetes mellitus    Diabetes mellitus    Dizziness    Fibromyalgia    Full dentures    GERD (gastroesophageal reflux disease)    Headache(784.0)    Hernia    incisional   History of back surgery    multiple   Hyperlipidemia    Hypertension    MRSA (methicillin resistant Staphylococcus aureus)    Nocturia    Paroxysmal vertigo    Scar    Sleep difficulties    Stroke ([place])    crica [id number]   Umbilical hernia    Wears glasses     PAST SURGICAL HISTORY: Past Surgical History:  Procedure Laterality Date   ABDOMINAL HYSTERECTOMY     APPENDECTOMY     BACK SURGERY     SPINAL FUSION X6    CARDIAC CATHETERIZATION N/A 12/23/2015   Procedure: Left Heart Cath and Coronary Angiography;  Surgeon: Adrian Prows, MD;  Location: [place] [facility];  Service: Cardiovascular;  Laterality: N/A;   CEREBRAL ANEURYSM REPAIR     CHOLECYSTECTOMY  1984   EYE SURGERY  2010   bilateral   HERNIA REPAIR     09/21/2010   HERNIA REPAIR  05/19/11   incisional hernia repair    INCISIONAL HERNIA REPAIR  05/19/2011   Procedure: HERNIA REPAIR INCISIONAL;  Surgeon: Willey Blade, MD;  Location: [facility];  Service: General;  Laterality: N/A;  repair incisional hernia with mesh   IR ANGIO INTRA EXTRACRAN SEL COM CAROTID INNOMINATE BILAT MOD SED  05/09/2018   IR ANGIO VERTEBRAL SEL VERTEBRAL BILAT MOD SED  05/09/2018   IR GENERIC HISTORICAL  09/30/2016   IR ANGIO VERTEBRAL SEL VERTEBRAL BILAT MOD SED 09/30/2016 Luanne Bras, MD [facility]   IR GENERIC HISTORICAL  09/30/2016   IR ANGIO INTRA EXTRACRAN SEL COM CAROTID INNOMINATE BILAT MOD SED 09/30/2016 Luanne Bras, MD [facility]   LEFT HEART CATH AND CORONARY ANGIOGRAPHY N/A  05/22/2019   Procedure: LEFT HEART CATH AND CORONARY ANGIOGRAPHY;  Surgeon: Adrian Prows, MD;  Location: [place] [facility];  Service: Cardiovascular;  Laterality: N/A;   LEFT HEART CATHETERIZATION WITH CORONARY ANGIOGRAM N/A 11/05/2013   Procedure: LEFT HEART CATHETERIZATION WITH CORONARY ANGIOGRAM;  Surgeon: Laverda Page, MD;  Location: [facility];  Service: Cardiovascular;  Laterality: N/A;   SPINAL FUSION  2006   TUBAL LIGATION      FAMILY HISTORY: The patient's family history includes Allergies in her daughter; Breast cancer in  her mother; Heart disease in her father.   SOCIAL HISTORY:  The patient  reports that she has been smoking cigarettes. She has a 9.50 pack-year smoking history. She has never used smokeless tobacco. She reports that she does not drink alcohol and does not use drugs.  Review of Systems  Constitutional: Negative for malaise/fatigue.  Cardiovascular:  Negative for chest pain, claudication, dyspnea on exertion, irregular heartbeat, leg swelling, near-syncope, orthopnea, palpitations, paroxysmal nocturnal dyspnea and syncope.  Respiratory:  Negative for shortness of breath.   Hematologic/Lymphatic: Negative for bleeding problem.  Musculoskeletal:  Negative for muscle cramps and myalgias.  Neurological:  Positive for vertigo.    PHYSICAL EXAM:    05/11/2022    1:58 PM 04/01/2022    1:25 PM 04/01/2022    1:17 PM  Vitals with BMI  Height '5\' 5"'$   '5\' 5"'$   Weight 154 lbs 10 oz  154 lbs  BMI 27.03  50.09  Systolic 381 829 937  Diastolic 69 169 99  Pulse 73 66 66   Physical Exam  Constitutional: No distress.  Age appropriate, hemodynamically stable.   Neck: No JVD present.  Cardiovascular: Normal rate, regular rhythm, S1 normal, S2 normal, intact distal pulses and normal pulses. Exam reveals no gallop, no S3 and no S4.  No murmur heard. Pulmonary/Chest: Effort normal and breath sounds normal. No stridor. She has no wheezes. She has no rales.  Abdominal: Soft.  Bowel sounds are normal. She exhibits no distension. There is no abdominal tenderness.  Musculoskeletal:        General: No edema.     Cervical back: Neck supple.  Neurological: She is alert and oriented to person, place, and time. She has intact cranial nerves (2-12).  Skin: Skin is warm and moist.   CARDIAC DATABASE: EKG: 05/02/2020: Sinus bradycardia, 56 bpm, normal axis, without underlying ischemia or injury pattern.  04/01/22: Normal sinus rhythm, 66 bpm, without underlying ischemia or injury pattern.  Echocardiogram: 04/27/2022: Left ventricle cavity is normal in size. Mild concentric hypertrophy of the left ventricle. Normal global wall motion. Normal LV systolic function with EF 62%. Doppler evidence of grade I (impaired) diastolic dysfunction, normal LAP.  Trileaflet aortic valve with aortic sclerosis (Mean PG 5 mmHg). No regurgitation. No evidence of pulmonary hypertension. Unlike study in 2020, MR not appreciated.    Stress Testing:  Nuclear stress test 12/05/2015:  Patient exercised on Bruce protocol for 5.0  minutes and achieved 4.64 METS. Stress test terminated due to target heart rate( 1.1% MPHR) and dyspnea.  Left ventricular cavity is noted to be enlarged on the stress study.  The TID index was 1.41.  SPECT images demonstrate homogeneous tracer distribution throughout the myocardium.   Gated SPECT images reveal normal myocardial thickening and wall motion.  LVEF visually estimated 63%.   Blood cells this is a high  risk study in view of abnormal EKG response and transient ischemic dilatation of LV noted on stress images.  clinical correlation recommended.  Heart Catheterization: Cardiac catheterization 05/22/2019: Normal LV systolic function, normal LVEDP.  No wall motion abnormality. Left main: Distal left main shows about a 30% stenosis. LAD mild to moderate diffusely diseased vessel, in the proximal segment diffuse 20 to 30% stenosis.  Large D1, D2 and D3, mild  ostial disease. Circumflex: High origin of OM1 from the proximal segment, bifurcation and proximal segment has a 40 to 50% stenosis followed by tandem mid 40 to 50% stenosis. RCA: PDA has a 40 to 50% stenosis, large vessel.  Carotid artery duplex 04/27/2022: The bifurcation, internal, external and common carotid arteries reveal no evidence of significant stenosis, bilaterally. Antegrade right vertebral artery flow. Antegrade left vertebral artery flow. COmpared to 03/02/2017, no significant change.   LABORATORY DATA: External Labs: Collected: 03/04/2022 provided by primary team. Total cholesterol 161, triglycerides 130, HDL 43, LDL 96, non-HDL 119,. BUN 13, creatinine 0.6. Sodium 139, potassium 4.7, chloride 105, bicarb 30 AST 19, ALT 19, alkaline phosphatase 104. Hemoglobin 13.4, hematocrit 40%. A1c 9. TSH 0.93  IMPRESSION:    ICD-10-CM   1. Benign hypertension  I10 hydrALAZINE (APRESOLINE) 25 MG tablet    PCV RENAL/RENAL ARTERY DUPLEX COMPLETE    2. Atherosclerosis of native coronary artery of native heart without angina pectoris  I25.10     3. Mixed hyperlipidemia  E78.2     4. Type 1 diabetes mellitus with hyperglycemia (HCC)  E10.65     5. Long-term insulin use (HCC)  Z79.4     6. Tobacco use disorder  F17.200        RECOMMENDATIONS: Mallory Mendez is a 66 y.o. female whose past medical history and cardiovascular risk factors include: Insulin-dependent diabetes mellitus type 1, hypertension, hyperlipidemia, history of cerebral aneurysm repair in 2012, continued tobacco abuse, moderate diffuse CAD by angiogram in 2017, postmenopausal female.  Benign hypertension Improving. Reemphasized importance of checking her blood pressures regularly for the time being and that her medications are titrated. Office blood pressures have improved. Has weaned off of clonidine successfully. Start hydralazine 25 mg p.o. 3 times daily Echocardiogram results reviewed. Will check renal  duplex.  Atherosclerosis of native coronary artery of native heart without angina pectoris Denies angina pectoris. EKG: Nonischemic. Echo: Preserved LVEF, grade 1 diastolic impairment, no significant valvular heart disease.   No additional testing warranted at this time.  Mixed hyperlipidemia Currently on atorvastatin.   She denies myalgia or other side effects. Most recent lipids dated 03/04/2022, independently reviewed as noted above. LDL currently not at goal.  Given her history of diabetes recommended goal LDL of less than 70 mg/dL. Currently managed by primary care provider.  Type 1 diabetes mellitus with hyperglycemia ([place]) Reemphasized importance of glycemic control. Continue losartan, statin Currently being managed by other provider's in her care team  Tobacco use disorder Reemphasized importance of complete smoking cessation  Bilateral carotid bruits Carotid duplex from October 2023 notes no significant ICA stenosis.   Reemphasized the importance of complete smoking cessation   FINAL MEDICATION LIST END OF ENCOUNTER: Meds ordered this encounter  Medications   hydrALAZINE (APRESOLINE) 25 MG tablet    Sig: Take 1 tablet (25 mg total) by mouth 3 (three) times daily.    Dispense:  270 tablet    Refill:  0    Current Outpatient Medications:    ALPRAZolam (XANAX) 1 MG tablet, Take 1 mg by mouth 3 (three) times daily as needed for anxiety. , Disp: , Rfl:    amLODipine (NORVASC) 10 MG tablet, Take 1 tablet (10 mg total) by mouth daily., Disp: 90 tablet, Rfl: 1   aspirin 81 MG chewable tablet, Chew 81 mg by mouth daily., Disp: , Rfl:    atorvastatin (LIPITOR) 40 MG tablet, Take 1 tablet (40 mg total) by mouth daily., Disp: 90 tablet, Rfl: 1   esomeprazole (NEXIUM) 40 MG capsule, Take 40 mg by mouth every evening. , Disp: , Rfl:    estradiol (ESTRACE) 1 MG tablet, Take 1 mg by mouth daily., Disp: , Rfl:    gabapentin (NEURONTIN)  100 MG capsule, TAKE ONE CAPSULE BY MOUTH  EVERYDAY AT BEDTIME (Patient taking differently: Take 100 mg by mouth as needed.), Disp: 30 capsule, Rfl: 1   HUMALOG KWIKPEN 100 UNIT/ML KwikPen, SMARTSIG:10-20 Unit(s) SUB-Q 3 Times Daily, Disp: , Rfl:    hydrALAZINE (APRESOLINE) 25 MG tablet, Take 1 tablet (25 mg total) by mouth 3 (three) times daily., Disp: 270 tablet, Rfl: 0   ibuprofen (ADVIL) 800 MG tablet, Take 1 tablet (800 mg total) by mouth every 8 (eight) hours as needed., Disp: 60 tablet, Rfl: 0   insulin glargine (LANTUS) 100 UNIT/ML injection, Inject 63 Units into the skin 2 (two) times daily. 50 units in the morning, and 10 units at bedtime, Disp: , Rfl:    isosorbide mononitrate (IMDUR) 30 MG 24 hr tablet, Take 1 tablet (30 mg total) by mouth daily., Disp: 90 tablet, Rfl: 1   labetalol (NORMODYNE) 100 MG tablet, Take 1 tablet (100 mg total) by mouth 2 (two) times daily., Disp: 60 tablet, Rfl: 2   levocetirizine (XYZAL) 5 MG tablet, Take 5 mg by mouth every evening., Disp: , Rfl:    losartan-hydrochlorothiazide (HYZAAR) 100-25 MG tablet, Take 1 tablet by mouth every morning., Disp: , Rfl:    meclizine (ANTIVERT) 25 MG tablet, Take 25 mg by mouth daily as needed., Disp: , Rfl:    naproxen (NAPROSYN) 500 MG tablet, Take 1 tablet (500 mg total) by mouth 2 (two) times daily with a meal. (Patient taking differently: Take 500 mg by mouth as needed.), Disp: 60 tablet, Rfl: 1   nitroGLYCERIN (NITROSTAT) 0.4 MG SL tablet, Place 0.4 mg under the tongue every 5 (five) minutes as needed for chest pain., Disp: , Rfl:    oxycodone (ROXICODONE) 30 MG immediate release tablet, Take 30 mg by mouth every 12 (twelve) hours as needed for pain., Disp: , Rfl:    ramelteon (ROZEREM) 8 MG tablet, Take 8 mg by mouth at bedtime as needed., Disp: , Rfl:    zolpidem (AMBIEN) 10 MG tablet, Take 10 mg by mouth at bedtime as needed for sleep., Disp: , Rfl:   Orders Placed This Encounter  Procedures   PCV RENAL/RENAL ARTERY DUPLEX COMPLETE   --Continue cardiac  medications as reconciled in final medication list. --Return in about 8 weeks (around 07/06/2022) for Dyspnea, BP. Or sooner if needed. --Continue follow-up with your primary care physician regarding the management of your other chronic comorbid conditions.  There are no Patient Instructions on file for this visit.  Patient's questions and concerns were addressed to her satisfaction. She voices understanding of the instructions provided during this encounter.   This note was created using a voice recognition software as a result there may be grammatical errors inadvertently enclosed that do not reflect the nature of this encounter. Every attempt is made to correct such errors.  Rex Kras, [place], [facility]  Pager: [phone] Office: [phone]

## 2022-05-13 DIAGNOSIS — I10 Essential (primary) hypertension: Secondary | ICD-10-CM | POA: Diagnosis not present

## 2022-05-13 DIAGNOSIS — G894 Chronic pain syndrome: Secondary | ICD-10-CM | POA: Diagnosis not present

## 2022-05-13 DIAGNOSIS — F5101 Primary insomnia: Secondary | ICD-10-CM | POA: Diagnosis not present

## 2022-05-13 DIAGNOSIS — F330 Major depressive disorder, recurrent, mild: Secondary | ICD-10-CM | POA: Diagnosis not present

## 2022-05-13 DIAGNOSIS — F0781 Postconcussional syndrome: Secondary | ICD-10-CM | POA: Diagnosis not present

## 2022-05-13 DIAGNOSIS — F411 Generalized anxiety disorder: Secondary | ICD-10-CM | POA: Diagnosis not present

## 2022-05-13 DIAGNOSIS — I2510 Atherosclerotic heart disease of native coronary artery without angina pectoris: Secondary | ICD-10-CM | POA: Diagnosis not present

## 2022-05-13 DIAGNOSIS — E1042 Type 1 diabetes mellitus with diabetic polyneuropathy: Secondary | ICD-10-CM | POA: Diagnosis not present

## 2022-05-13 DIAGNOSIS — L719 Rosacea, unspecified: Secondary | ICD-10-CM | POA: Diagnosis not present

## 2022-05-31 DIAGNOSIS — I2510 Atherosclerotic heart disease of native coronary artery without angina pectoris: Secondary | ICD-10-CM | POA: Diagnosis not present

## 2022-05-31 DIAGNOSIS — E7800 Pure hypercholesterolemia, unspecified: Secondary | ICD-10-CM | POA: Diagnosis not present

## 2022-05-31 DIAGNOSIS — I10 Essential (primary) hypertension: Secondary | ICD-10-CM | POA: Diagnosis not present

## 2022-05-31 DIAGNOSIS — E1042 Type 1 diabetes mellitus with diabetic polyneuropathy: Secondary | ICD-10-CM | POA: Diagnosis not present

## 2022-06-03 DIAGNOSIS — R0683 Snoring: Secondary | ICD-10-CM | POA: Diagnosis not present

## 2022-06-09 DIAGNOSIS — I10 Essential (primary) hypertension: Secondary | ICD-10-CM | POA: Diagnosis not present

## 2022-06-14 ENCOUNTER — Ambulatory Visit: Payer: Medicare HMO

## 2022-06-14 DIAGNOSIS — I10 Essential (primary) hypertension: Secondary | ICD-10-CM

## 2022-06-14 DIAGNOSIS — I700 Atherosclerosis of aorta: Secondary | ICD-10-CM

## 2022-06-14 DIAGNOSIS — F17200 Nicotine dependence, unspecified, uncomplicated: Secondary | ICD-10-CM

## 2022-06-14 DIAGNOSIS — I714 Abdominal aortic aneurysm, without rupture, unspecified: Secondary | ICD-10-CM

## 2022-06-16 DIAGNOSIS — E103292 Type 1 diabetes mellitus with mild nonproliferative diabetic retinopathy without macular edema, left eye: Secondary | ICD-10-CM | POA: Diagnosis not present

## 2022-06-20 NOTE — Progress Notes (Unsigned)
Renal duplex was ordered given her uncontrolled hypertension. On that study she was noted to have a distal abdominal aorta measuring 3.3 cm. Given her elevated blood pressures, smoking history, aortic atherosclerosis we will proceed with dedicated ultrasound of the aorta for further evaluation and management.  Hayde Kilgour Luke, DO, [facility]

## 2022-06-23 ENCOUNTER — Other Ambulatory Visit: Payer: Self-pay | Admitting: Cardiology

## 2022-06-23 DIAGNOSIS — I10 Essential (primary) hypertension: Secondary | ICD-10-CM

## 2022-06-23 NOTE — Progress Notes (Signed)
Left message to call back for test results.

## 2022-06-23 NOTE — Progress Notes (Signed)
Pt aware.

## 2022-06-24 ENCOUNTER — Ambulatory Visit: Payer: Medicare HMO

## 2022-06-24 DIAGNOSIS — F17200 Nicotine dependence, unspecified, uncomplicated: Secondary | ICD-10-CM | POA: Diagnosis not present

## 2022-06-24 DIAGNOSIS — I714 Abdominal aortic aneurysm, without rupture, unspecified: Secondary | ICD-10-CM

## 2022-06-24 DIAGNOSIS — I700 Atherosclerosis of aorta: Secondary | ICD-10-CM

## 2022-06-24 DIAGNOSIS — I10 Essential (primary) hypertension: Secondary | ICD-10-CM | POA: Diagnosis not present

## 2022-06-25 DIAGNOSIS — E1042 Type 1 diabetes mellitus with diabetic polyneuropathy: Secondary | ICD-10-CM | POA: Diagnosis not present

## 2022-06-25 DIAGNOSIS — E7800 Pure hypercholesterolemia, unspecified: Secondary | ICD-10-CM | POA: Diagnosis not present

## 2022-06-25 DIAGNOSIS — I2510 Atherosclerotic heart disease of native coronary artery without angina pectoris: Secondary | ICD-10-CM | POA: Diagnosis not present

## 2022-06-25 DIAGNOSIS — I10 Essential (primary) hypertension: Secondary | ICD-10-CM | POA: Diagnosis not present

## 2022-06-30 NOTE — Progress Notes (Signed)
Pt aware.

## 2022-07-07 ENCOUNTER — Ambulatory Visit: Payer: Medicare HMO | Admitting: Cardiology

## 2022-07-09 ENCOUNTER — Ambulatory Visit: Payer: Medicare HMO | Admitting: Cardiology

## 2022-07-09 ENCOUNTER — Encounter: Payer: Self-pay | Admitting: Cardiology

## 2022-07-09 VITALS — BP 127/83 | HR 80 | Resp 16 | Ht 65.0 in | Wt 157.8 lb

## 2022-07-09 DIAGNOSIS — E1065 Type 1 diabetes mellitus with hyperglycemia: Secondary | ICD-10-CM | POA: Diagnosis not present

## 2022-07-09 DIAGNOSIS — Z794 Long term (current) use of insulin: Secondary | ICD-10-CM | POA: Diagnosis not present

## 2022-07-09 DIAGNOSIS — F17200 Nicotine dependence, unspecified, uncomplicated: Secondary | ICD-10-CM

## 2022-07-09 DIAGNOSIS — I10 Essential (primary) hypertension: Secondary | ICD-10-CM

## 2022-07-09 DIAGNOSIS — I2510 Atherosclerotic heart disease of native coronary artery without angina pectoris: Secondary | ICD-10-CM | POA: Diagnosis not present

## 2022-07-09 DIAGNOSIS — E782 Mixed hyperlipidemia: Secondary | ICD-10-CM

## 2022-07-09 DIAGNOSIS — F17210 Nicotine dependence, cigarettes, uncomplicated: Secondary | ICD-10-CM | POA: Diagnosis not present

## 2022-07-09 MED ORDER — SPIRONOLACTONE 25 MG PO TABS: 25.0000 mg | ORAL_TABLET | Freq: Every morning | ORAL | 0 refills | Status: DC

## 2022-07-09 NOTE — Progress Notes (Signed)
Mallory Mendez Date of Birth: 03-27-1956 MRN: [id number] Primary Care Provider:Harris, Gwyndolyn Saxon, MD Former Cardiology Providers: Jeri Lager, APRN, FNP-C Primary Cardiologist: Rex Kras, DO, [facility] (established care 05/02/2020)  Date: 07/09/22 Last Office Visit: 05/11/2022  Chief Complaint  Patient presents with   Shortness of Breath   Hypertension   Follow-up    8 weeks    HPI  Mallory Mendez is a 67 y.o.  female whose past medical history and cardiovascular risk factors include: Type 1 diabetes mellitus, hypertension, hyperlipidemia, history of cerebral aneurysm repair in 2012, continued tobacco abuse, moderate diffuse CAD by angiogram in 2017, postmenopausal female.  Noted to have moderate diffuse CAD per angiogram as of 2017 and thereafter was lost to follow-up.  She presented to the office in September 2023 due to uncontrolled hypertension.  She was enrolled into principal care management and her medications have been slowly titrated and her average blood pressures now has been around 142/85 with a pulse of 81 bpm.  Since last office visit she is undergone a renal duplex and aortic duplex results reviewed and noted below for further reference.  Clinically denies anginal discomfort or heart failure symptoms.  Remains emotionally labile as she is coming up to 1 year anniversary her daughter passed away.  FUNCTIONAL STATUS: No structured exercise program or daily routine.  ALLERGIES: Allergies  Allergen Reactions   Cymbalta [Duloxetine Hcl] Anaphylaxis   Latex Anaphylaxis and Hives     MEDICATION LIST PRIOR TO VISIT: Current Outpatient Medications on File Prior to Visit  Medication Sig Dispense Refill   ALPRAZolam (XANAX) 1 MG tablet Take 1 mg by mouth 3 (three) times daily as needed for anxiety.      amLODipine (NORVASC) 10 MG tablet Take 1 tablet (10 mg total) by mouth daily. 90 tablet 1   aspirin 81 MG chewable tablet Chew 81 mg by mouth daily.     atorvastatin  (LIPITOR) 40 MG tablet Take 1 tablet (40 mg total) by mouth daily. 90 tablet 1   esomeprazole (NEXIUM) 40 MG capsule Take 40 mg by mouth every evening.      estradiol (ESTRACE) 1 MG tablet Take 1 mg by mouth daily.     HUMALOG KWIKPEN 100 UNIT/ML KwikPen SMARTSIG:10-20 Unit(s) SUB-Q 3 Times Daily     ibuprofen (ADVIL) 800 MG tablet Take 1 tablet (800 mg total) by mouth every 8 (eight) hours as needed. 60 tablet 0   insulin glargine (LANTUS) 100 UNIT/ML injection Inject 63 Units into the skin 2 (two) times daily. 50 units in the morning, and 10 units at bedtime     isosorbide mononitrate (IMDUR) 30 MG 24 hr tablet Take 1 tablet (30 mg total) by mouth daily. 90 tablet 1   labetalol (NORMODYNE) 100 MG tablet TAKE ONE TABLET BY MOUTH TWICE DAILY 60 tablet 2   levocetirizine (XYZAL) 5 MG tablet Take 5 mg by mouth every evening.     losartan-hydrochlorothiazide (HYZAAR) 100-25 MG tablet Take 1 tablet by mouth every morning.     meclizine (ANTIVERT) 25 MG tablet Take 25 mg by mouth daily as needed.     nitroGLYCERIN (NITROSTAT) 0.4 MG SL tablet Place 0.4 mg under the tongue every 5 (five) minutes as needed for chest pain.     oxycodone (ROXICODONE) 30 MG immediate release tablet Take 30 mg by mouth every 12 (twelve) hours as needed for pain.     ramelteon (ROZEREM) 8 MG tablet Take 8 mg by mouth at bedtime as needed.  zolpidem (AMBIEN) 10 MG tablet Take 10 mg by mouth at bedtime as needed for sleep.     No current facility-administered medications on file prior to visit.    PAST MEDICAL HISTORY: Past Medical History:  Diagnosis Date   Anxiety    Arthritis    Blood transfusion    Brain injury ([place])    1 1/2 YR AGO   Chronic constipation    Chronic stomach ulcer    Concussion    HAS HAD DIZZINESS/ MEMORY PROBLEMS/ SLOW SPEACH /BALANCE PROBLEMS SINCE CONCUSSION 1 1/2 YR AGO PR GOES TO THERAPY   Concussion    Depression    Diabetes mellitus    Diabetes mellitus    Dizziness     Fibromyalgia    Full dentures    GERD (gastroesophageal reflux disease)    Headache(784.0)    Hernia    incisional   History of back surgery    multiple   Hyperlipidemia    Hypertension    MRSA (methicillin resistant Staphylococcus aureus)    Nocturia    Paroxysmal vertigo    Scar    Sleep difficulties    Stroke ([place])    crica [id number]   Umbilical hernia    Wears glasses     PAST SURGICAL HISTORY: Past Surgical History:  Procedure Laterality Date   ABDOMINAL HYSTERECTOMY     APPENDECTOMY     BACK SURGERY     SPINAL FUSION X6    CARDIAC CATHETERIZATION N/A 12/23/2015   Procedure: Left Heart Cath and Coronary Angiography;  Surgeon: Adrian Prows, MD;  Location: [place] [facility];  Service: Cardiovascular;  Laterality: N/A;   CEREBRAL ANEURYSM REPAIR     CHOLECYSTECTOMY  1984   EYE SURGERY  2010   bilateral   HERNIA REPAIR     09/21/2010   HERNIA REPAIR  05/19/11   incisional hernia repair    INCISIONAL HERNIA REPAIR  05/19/2011   Procedure: HERNIA REPAIR INCISIONAL;  Surgeon: Willey Blade, MD;  Location: [facility];  Service: General;  Laterality: N/A;  repair incisional hernia with mesh   IR ANGIO INTRA EXTRACRAN SEL COM CAROTID INNOMINATE BILAT MOD SED  05/09/2018   IR ANGIO VERTEBRAL SEL VERTEBRAL BILAT MOD SED  05/09/2018   IR GENERIC HISTORICAL  09/30/2016   IR ANGIO VERTEBRAL SEL VERTEBRAL BILAT MOD SED 09/30/2016 Luanne Bras, MD [facility]   IR GENERIC HISTORICAL  09/30/2016   IR ANGIO INTRA EXTRACRAN SEL COM CAROTID INNOMINATE BILAT MOD SED 09/30/2016 Luanne Bras, MD [facility]   LEFT HEART CATH AND CORONARY ANGIOGRAPHY N/A 05/22/2019   Procedure: LEFT HEART CATH AND CORONARY ANGIOGRAPHY;  Surgeon: Adrian Prows, MD;  Location: [place] [facility];  Service: Cardiovascular;  Laterality: N/A;   LEFT HEART CATHETERIZATION WITH CORONARY ANGIOGRAM N/A 11/05/2013   Procedure: LEFT HEART CATHETERIZATION WITH CORONARY ANGIOGRAM;  Surgeon: Laverda Page, MD;   Location: [facility];  Service: Cardiovascular;  Laterality: N/A;   SPINAL FUSION  2006   TUBAL LIGATION      FAMILY HISTORY: The patient's family history includes Allergies in her daughter; Breast cancer in her mother; Diabetes in her daughter and sister; Heart disease in her father; Scoliosis in her daughter.   SOCIAL HISTORY:  The patient  reports that she has been smoking cigarettes. She has a 27.00 pack-year smoking history. She has never used smokeless tobacco. She reports that she does not drink alcohol and does not use drugs.  Review of Systems  Cardiovascular:  Negative for chest pain, claudication, dyspnea on exertion, irregular heartbeat, leg swelling, near-syncope, orthopnea, palpitations, paroxysmal nocturnal dyspnea and syncope.  Respiratory:  Negative for shortness of breath.   Hematologic/Lymphatic: Negative for bleeding problem.  Musculoskeletal:  Negative for muscle cramps and myalgias.  Neurological:  Negative for dizziness and light-headedness.    PHYSICAL EXAM:    07/09/2022    2:11 PM 05/11/2022    1:58 PM 04/01/2022    1:25 PM  Vitals with BMI  Height '5\' 5"'$  '5\' 5"'$    Weight 157 lbs 13 oz 154 lbs 10 oz   BMI 40.08 67.61   Systolic 950 932 671  Diastolic 83 69 245  Pulse 80 73 66   Physical Exam  Constitutional: No distress.  Age appropriate, hemodynamically stable.   Neck: No JVD present.  Cardiovascular: Normal rate, regular rhythm, S1 normal, S2 normal, intact distal pulses and normal pulses. Exam reveals no gallop, no S3 and no S4.  No murmur heard. Pulmonary/Chest: Effort normal and breath sounds normal. No stridor. She has no wheezes. She has no rales.  Abdominal: Soft. Bowel sounds are normal. She exhibits no distension. There is no abdominal tenderness.  Musculoskeletal:        General: No edema.     Cervical back: Neck supple.  Neurological: She is alert and oriented to person, place, and time. She has intact cranial nerves (2-12).  Skin: Skin is  warm and moist.   CARDIAC DATABASE: EKG: 05/02/2020: Sinus bradycardia, 56 bpm, normal axis, without underlying ischemia or injury pattern.  04/01/22: Normal sinus rhythm, 66 bpm, without underlying ischemia or injury pattern.  Echocardiogram: 04/27/2022: Left ventricle cavity is normal in size. Mild concentric hypertrophy of the left ventricle. Normal global wall motion. Normal LV systolic function with EF 62%. Doppler evidence of grade I (impaired) diastolic dysfunction, normal LAP.  Trileaflet aortic valve with aortic sclerosis (Mean PG 5 mmHg). No regurgitation. No evidence of pulmonary hypertension. Unlike study in 2020, MR not appreciated.    Stress Testing:  Nuclear stress test 12/05/2015:  Patient exercised on Bruce protocol for 5.0  minutes and achieved 4.64 METS. Stress test terminated due to target heart rate( 1.1% MPHR) and dyspnea.  Left ventricular cavity is noted to be enlarged on the stress study.  The TID index was 1.41.  SPECT images demonstrate homogeneous tracer distribution throughout the myocardium.   Gated SPECT images reveal normal myocardial thickening and wall motion.  LVEF visually estimated 63%.   Blood cells this is a high  risk study in view of abnormal EKG response and transient ischemic dilatation of LV noted on stress images.  clinical correlation recommended.  Heart Catheterization: Cardiac catheterization 05/22/2019: Normal LV systolic function, normal LVEDP.  No wall motion abnormality. Left main: Distal left main shows about a 30% stenosis. LAD mild to moderate diffusely diseased vessel, in the proximal segment diffuse 20 to 30% stenosis.  Large D1, D2 and D3, mild ostial disease. Circumflex: High origin of OM1 from the proximal segment, bifurcation and proximal segment has a 40 to 50% stenosis followed by tandem mid 40 to 50% stenosis. RCA: PDA has a 40 to 50% stenosis, large vessel.  Carotid artery duplex 04/27/2022: The bifurcation, internal,  external and common carotid arteries reveal no evidence of significant stenosis, bilaterally. Antegrade right vertebral artery flow. Antegrade left vertebral artery flow. COmpared to 03/02/2017, no significant change.   Renal artery duplex 06/14/2022: No evidence of renal artery occlusive disease in either renal artery. 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 Renal length is within normal limits for both kidneys. 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 Diffuse plaque observed in the abdominal aorta. Normal abdominal aorta flow velocities noted. Consider dedicated aortic duplex.  Abdominal Aortic Duplex 06/24/2022: Normal abdominal aorta. No evidence of aneurysm. Mildly elevated bilateral CIA velocity not hemodynamically significant. No significant plaque noted. Normal triphasic waveform pattern noted in the iliac vessels.   LABORATORY DATA: External Labs: Collected: 03/04/2022 provided by primary team. Total cholesterol 161, triglycerides 130, HDL 43, LDL 96, non-HDL 119,. BUN 13, creatinine 0.6. Sodium 139, potassium 4.7, chloride 105, bicarb 30 AST 19, ALT 19, alkaline phosphatase 104. Hemoglobin 13.4, hematocrit 40%. A1c 9. TSH 0.93  IMPRESSION:    ICD-10-CM   1. Benign hypertension  I10 spironolactone (ALDACTONE) 25 MG tablet    Basic metabolic panel    Magnesium    2. Atherosclerosis of native coronary artery of native heart without angina pectoris  I25.10     3. Mixed hyperlipidemia  E78.2     4. Type 1 diabetes mellitus with hyperglycemia (HCC)  E10.65     5. Long-term insulin use (HCC)  Z79.4     6. Tobacco use disorder  F17.200     7. Smoker  F17.200        RECOMMENDATIONS: Mallory Mendez is a 67 y.o. female whose past medical history and cardiovascular risk factors include: Insulin-dependent diabetes mellitus type 1, hypertension, hyperlipidemia, history of cerebral aneurysm repair in 2012, continued tobacco  abuse, moderate diffuse CAD by angiogram in 2017, postmenopausal female.  Benign hypertension Much improved. Office and home blood pressures are better controlled. Renal and duplex reviewed with the patient at today's office visit Reemphasized the importance of complete smoking cessation Discontinue hydralazine 25 mg p.o. 3 times daily Start spironolactone 25 mg p.o. every morning Labs in 1 week to evaluate renal function. Reemphasized to her importance of checking her blood pressures at home.  Atherosclerosis of native coronary artery of native heart without angina pectoris Denies anginal symptoms. EKG nonischemic. Echo: Preserved LVEF, grade 1 diastolic impairment, no significant valvular heart disease Reemphasized importance of complete smoking cessation and improving her modifiable cardiovascular risk factors  Mixed hyperlipidemia Currently on atorvastatin.   She denies myalgia or other side effects. Most recent lipids dated 03/04/2022, independently reviewed as noted above. Currently managed by primary care provider.  Tobacco use disorder Tobacco cessation counseling: Currently smoking 0.5 packs/day   Patient was informed of the dangers of tobacco abuse including stroke, cancer, and MI, as well as benefits of tobacco cessation. Patient is willing to quit at this time. 8 mins were spent counseling patient cessation techniques. We discussed various methods to help quit smoking, including deciding on a date to quit, joining a support group, pharmacological agents- nicotine gum/patch/lozenges.  I will reassess her progress at the next follow-up visit  FINAL MEDICATION LIST END OF ENCOUNTER: Meds ordered this encounter  Medications   spironolactone (ALDACTONE) 25 MG tablet    Sig: Take 1 tablet (25 mg total) by mouth every morning.    Dispense:  90 tablet    Refill:  0    Current Outpatient Medications:    ALPRAZolam (XANAX) 1 MG tablet, Take 1 mg by mouth 3 (three) times  daily as needed for anxiety. , Disp: , Rfl:    amLODipine (NORVASC) 10 MG tablet, Take 1 tablet (10 mg total) by mouth daily., Disp: 90 tablet, Rfl: 1   aspirin 81 MG  chewable tablet, Chew 81 mg by mouth daily., Disp: , Rfl:    atorvastatin (LIPITOR) 40 MG tablet, Take 1 tablet (40 mg total) by mouth daily., Disp: 90 tablet, Rfl: 1   esomeprazole (NEXIUM) 40 MG capsule, Take 40 mg by mouth every evening. , Disp: , Rfl:    estradiol (ESTRACE) 1 MG tablet, Take 1 mg by mouth daily., Disp: , Rfl:    HUMALOG KWIKPEN 100 UNIT/ML KwikPen, SMARTSIG:10-20 Unit(s) SUB-Q 3 Times Daily, Disp: , Rfl:    ibuprofen (ADVIL) 800 MG tablet, Take 1 tablet (800 mg total) by mouth every 8 (eight) hours as needed., Disp: 60 tablet, Rfl: 0   insulin glargine (LANTUS) 100 UNIT/ML injection, Inject 63 Units into the skin 2 (two) times daily. 50 units in the morning, and 10 units at bedtime, Disp: , Rfl:    isosorbide mononitrate (IMDUR) 30 MG 24 hr tablet, Take 1 tablet (30 mg total) by mouth daily., Disp: 90 tablet, Rfl: 1   labetalol (NORMODYNE) 100 MG tablet, TAKE ONE TABLET BY MOUTH TWICE DAILY, Disp: 60 tablet, Rfl: 2   levocetirizine (XYZAL) 5 MG tablet, Take 5 mg by mouth every evening., Disp: , Rfl:    losartan-hydrochlorothiazide (HYZAAR) 100-25 MG tablet, Take 1 tablet by mouth every morning., Disp: , Rfl:    meclizine (ANTIVERT) 25 MG tablet, Take 25 mg by mouth daily as needed., Disp: , Rfl:    nitroGLYCERIN (NITROSTAT) 0.4 MG SL tablet, Place 0.4 mg under the tongue every 5 (five) minutes as needed for chest pain., Disp: , Rfl:    oxycodone (ROXICODONE) 30 MG immediate release tablet, Take 30 mg by mouth every 12 (twelve) hours as needed for pain., Disp: , Rfl:    ramelteon (ROZEREM) 8 MG tablet, Take 8 mg by mouth at bedtime as needed., Disp: , Rfl:    spironolactone (ALDACTONE) 25 MG tablet, Take 1 tablet (25 mg total) by mouth every morning., Disp: 90 tablet, Rfl: 0   zolpidem (AMBIEN) 10 MG tablet, Take  10 mg by mouth at bedtime as needed for sleep., Disp: , Rfl:   Orders Placed This Encounter  Procedures   Basic metabolic panel   Magnesium   --Continue cardiac medications as reconciled in final medication list. --Return in about 6 months (around 01/07/2023) for Follow up, BP. Or sooner if needed. --Continue follow-up with your primary care physician regarding the management of your other chronic comorbid conditions.  There are no Patient Instructions on file for this visit.  Patient's questions and concerns were addressed to her satisfaction. She voices understanding of the instructions provided during this encounter.   This note was created using a voice recognition software as a result there may be grammatical errors inadvertently enclosed that do not reflect the nature of this encounter. Every attempt is made to correct such errors.  Rex Kras, [place], [facility]  Pager: [phone] Office: [phone]

## 2022-07-29 DIAGNOSIS — E1042 Type 1 diabetes mellitus with diabetic polyneuropathy: Secondary | ICD-10-CM | POA: Diagnosis not present

## 2022-07-29 DIAGNOSIS — I10 Essential (primary) hypertension: Secondary | ICD-10-CM | POA: Diagnosis not present

## 2022-07-29 DIAGNOSIS — I2510 Atherosclerotic heart disease of native coronary artery without angina pectoris: Secondary | ICD-10-CM | POA: Diagnosis not present

## 2022-07-29 DIAGNOSIS — E7800 Pure hypercholesterolemia, unspecified: Secondary | ICD-10-CM | POA: Diagnosis not present

## 2022-08-10 DIAGNOSIS — I10 Essential (primary) hypertension: Secondary | ICD-10-CM | POA: Diagnosis not present

## 2022-08-13 DIAGNOSIS — E663 Overweight: Secondary | ICD-10-CM | POA: Diagnosis not present

## 2022-08-13 DIAGNOSIS — J069 Acute upper respiratory infection, unspecified: Secondary | ICD-10-CM | POA: Diagnosis not present

## 2022-08-13 DIAGNOSIS — N390 Urinary tract infection, site not specified: Secondary | ICD-10-CM | POA: Diagnosis not present

## 2022-08-13 DIAGNOSIS — I10 Essential (primary) hypertension: Secondary | ICD-10-CM | POA: Diagnosis not present

## 2022-08-13 DIAGNOSIS — Z6826 Body mass index (BMI) 26.0-26.9, adult: Secondary | ICD-10-CM | POA: Diagnosis not present

## 2022-08-13 DIAGNOSIS — B300 Keratoconjunctivitis due to adenovirus: Secondary | ICD-10-CM | POA: Diagnosis not present

## 2022-08-18 DIAGNOSIS — E782 Mixed hyperlipidemia: Secondary | ICD-10-CM | POA: Diagnosis not present

## 2022-08-18 DIAGNOSIS — E103553 Type 1 diabetes mellitus with stable proliferative diabetic retinopathy, bilateral: Secondary | ICD-10-CM | POA: Diagnosis not present

## 2022-08-18 DIAGNOSIS — E1042 Type 1 diabetes mellitus with diabetic polyneuropathy: Secondary | ICD-10-CM | POA: Diagnosis not present

## 2022-08-18 DIAGNOSIS — I10 Essential (primary) hypertension: Secondary | ICD-10-CM | POA: Diagnosis not present

## 2022-08-27 DIAGNOSIS — I10 Essential (primary) hypertension: Secondary | ICD-10-CM | POA: Diagnosis not present

## 2022-08-27 DIAGNOSIS — E1042 Type 1 diabetes mellitus with diabetic polyneuropathy: Secondary | ICD-10-CM | POA: Diagnosis not present

## 2022-08-27 DIAGNOSIS — E7800 Pure hypercholesterolemia, unspecified: Secondary | ICD-10-CM | POA: Diagnosis not present

## 2022-08-27 DIAGNOSIS — I2510 Atherosclerotic heart disease of native coronary artery without angina pectoris: Secondary | ICD-10-CM | POA: Diagnosis not present

## 2022-09-09 DIAGNOSIS — I10 Essential (primary) hypertension: Secondary | ICD-10-CM | POA: Diagnosis not present

## 2022-09-21 DIAGNOSIS — M797 Fibromyalgia: Secondary | ICD-10-CM | POA: Diagnosis not present

## 2022-09-21 DIAGNOSIS — E103292 Type 1 diabetes mellitus with mild nonproliferative diabetic retinopathy without macular edema, left eye: Secondary | ICD-10-CM | POA: Diagnosis not present

## 2022-09-21 DIAGNOSIS — E1040 Type 1 diabetes mellitus with diabetic neuropathy, unspecified: Secondary | ICD-10-CM | POA: Diagnosis not present

## 2022-09-21 DIAGNOSIS — E1065 Type 1 diabetes mellitus with hyperglycemia: Secondary | ICD-10-CM | POA: Diagnosis not present

## 2022-09-21 DIAGNOSIS — F411 Generalized anxiety disorder: Secondary | ICD-10-CM | POA: Diagnosis not present

## 2022-09-21 DIAGNOSIS — I10 Essential (primary) hypertension: Secondary | ICD-10-CM | POA: Diagnosis not present

## 2022-09-21 DIAGNOSIS — E7800 Pure hypercholesterolemia, unspecified: Secondary | ICD-10-CM | POA: Diagnosis not present

## 2022-09-21 DIAGNOSIS — R8281 Pyuria: Secondary | ICD-10-CM | POA: Diagnosis not present

## 2022-09-21 DIAGNOSIS — E1042 Type 1 diabetes mellitus with diabetic polyneuropathy: Secondary | ICD-10-CM | POA: Diagnosis not present

## 2022-09-21 DIAGNOSIS — I25118 Atherosclerotic heart disease of native coronary artery with other forms of angina pectoris: Secondary | ICD-10-CM | POA: Diagnosis not present

## 2022-09-21 DIAGNOSIS — I2510 Atherosclerotic heart disease of native coronary artery without angina pectoris: Secondary | ICD-10-CM | POA: Diagnosis not present

## 2022-09-21 DIAGNOSIS — G894 Chronic pain syndrome: Secondary | ICD-10-CM | POA: Diagnosis not present

## 2022-09-26 ENCOUNTER — Other Ambulatory Visit: Payer: Self-pay | Admitting: Cardiology

## 2022-09-26 DIAGNOSIS — I10 Essential (primary) hypertension: Secondary | ICD-10-CM

## 2022-10-09 DIAGNOSIS — I10 Essential (primary) hypertension: Secondary | ICD-10-CM | POA: Diagnosis not present

## 2022-10-25 ENCOUNTER — Other Ambulatory Visit: Payer: Self-pay | Admitting: Cardiology

## 2022-10-25 DIAGNOSIS — I10 Essential (primary) hypertension: Secondary | ICD-10-CM

## 2022-10-26 DIAGNOSIS — I10 Essential (primary) hypertension: Secondary | ICD-10-CM | POA: Diagnosis not present

## 2022-10-26 DIAGNOSIS — I2510 Atherosclerotic heart disease of native coronary artery without angina pectoris: Secondary | ICD-10-CM | POA: Diagnosis not present

## 2022-10-26 DIAGNOSIS — E7800 Pure hypercholesterolemia, unspecified: Secondary | ICD-10-CM | POA: Diagnosis not present

## 2022-10-26 DIAGNOSIS — E1042 Type 1 diabetes mellitus with diabetic polyneuropathy: Secondary | ICD-10-CM | POA: Diagnosis not present

## 2022-11-08 DIAGNOSIS — I10 Essential (primary) hypertension: Secondary | ICD-10-CM | POA: Diagnosis not present

## 2022-11-11 DIAGNOSIS — I10 Essential (primary) hypertension: Secondary | ICD-10-CM | POA: Diagnosis not present

## 2022-11-11 DIAGNOSIS — E1142 Type 2 diabetes mellitus with diabetic polyneuropathy: Secondary | ICD-10-CM | POA: Diagnosis not present

## 2022-11-11 DIAGNOSIS — E782 Mixed hyperlipidemia: Secondary | ICD-10-CM | POA: Diagnosis not present

## 2022-11-11 DIAGNOSIS — Z794 Long term (current) use of insulin: Secondary | ICD-10-CM | POA: Diagnosis not present

## 2022-11-24 DIAGNOSIS — I2510 Atherosclerotic heart disease of native coronary artery without angina pectoris: Secondary | ICD-10-CM | POA: Diagnosis not present

## 2022-11-24 DIAGNOSIS — I10 Essential (primary) hypertension: Secondary | ICD-10-CM | POA: Diagnosis not present

## 2022-11-24 DIAGNOSIS — E1042 Type 1 diabetes mellitus with diabetic polyneuropathy: Secondary | ICD-10-CM | POA: Diagnosis not present

## 2022-11-24 DIAGNOSIS — E7800 Pure hypercholesterolemia, unspecified: Secondary | ICD-10-CM | POA: Diagnosis not present

## 2022-12-08 DIAGNOSIS — I10 Essential (primary) hypertension: Secondary | ICD-10-CM | POA: Diagnosis not present

## 2022-12-22 ENCOUNTER — Other Ambulatory Visit: Payer: Self-pay | Admitting: Cardiology

## 2022-12-22 DIAGNOSIS — I10 Essential (primary) hypertension: Secondary | ICD-10-CM

## 2023-01-04 ENCOUNTER — Ambulatory Visit: Payer: Medicare HMO | Admitting: Cardiology

## 2023-01-04 ENCOUNTER — Encounter: Payer: Self-pay | Admitting: Cardiology

## 2023-01-04 VITALS — BP 137/71 | HR 81 | Resp 16 | Ht 65.0 in | Wt 160.0 lb

## 2023-01-04 DIAGNOSIS — I2510 Atherosclerotic heart disease of native coronary artery without angina pectoris: Secondary | ICD-10-CM

## 2023-01-04 DIAGNOSIS — E782 Mixed hyperlipidemia: Secondary | ICD-10-CM | POA: Diagnosis not present

## 2023-01-04 DIAGNOSIS — F17200 Nicotine dependence, unspecified, uncomplicated: Secondary | ICD-10-CM

## 2023-01-04 DIAGNOSIS — E1065 Type 1 diabetes mellitus with hyperglycemia: Secondary | ICD-10-CM | POA: Diagnosis not present

## 2023-01-04 DIAGNOSIS — Z794 Long term (current) use of insulin: Secondary | ICD-10-CM | POA: Diagnosis not present

## 2023-01-04 DIAGNOSIS — I10 Essential (primary) hypertension: Secondary | ICD-10-CM

## 2023-01-04 DIAGNOSIS — F17210 Nicotine dependence, cigarettes, uncomplicated: Secondary | ICD-10-CM | POA: Diagnosis not present

## 2023-01-04 MED ORDER — NICOTINE 7 MG/24HR TD PT24
7.0000 mg | MEDICATED_PATCH | Freq: Every day | TRANSDERMAL | 0 refills | Status: AC
Start: 2023-01-04 — End: 2023-02-03

## 2023-01-04 MED ORDER — NICOTINE POLACRILEX 2 MG MT LOZG
2.0000 mg | LOZENGE | OROMUCOSAL | 0 refills | Status: DC | PRN
Start: 2023-01-04 — End: 2023-01-31

## 2023-01-04 NOTE — Progress Notes (Signed)
Mallory Mendez Date of Birth: [birth year] MRN: [id number] Primary Care Provider:Harris, Tawni Pummel, MD Former Cardiology Providers: Altamese Friesland, APRN, FNP-C Primary Cardiologist: Tessa Lerner, DO, [facility] (established care 05/02/2020)  Date: 01/04/23 Last Office Visit: 07/09/2022  Chief Complaint  Patient presents with   Benign hypertension    HPI  Mallory Mendez is a 67 y.o.  female whose past medical history and cardiovascular risk factors include: Type 1 diabetes mellitus, hypertension, hyperlipidemia, history of cerebral aneurysm repair in 2012, continued tobacco abuse, moderate diffuse CAD by angiogram in 2017, postmenopausal female.  Noted to have moderate diffuse CAD per angiogram as of 2017 and thereafter was lost to follow-up.  She presented to the office in September 2023 due to uncontrolled hypertension.  Since then blood pressure medications have been uptitrated and her numbers are now better controlled.  At the last office visit discontinued hydralazine and started her on spironolactone 25 mg p.o. every morning.  She presents today for 64-month follow-up visit.  Denies anginal chest pain or heart failure symptoms.  Home blood pressure is also well-controlled.  She smokes approximately 0.5 packs/day.  FUNCTIONAL STATUS: No structured exercise program or daily routine.  ALLERGIES: Allergies  Allergen Reactions   Cymbalta [Duloxetine Hcl] Anaphylaxis   Latex Anaphylaxis and Hives     MEDICATION LIST PRIOR TO VISIT: Current Outpatient Medications on File Prior to Visit  Medication Sig Dispense Refill   ALPRAZolam (XANAX) 1 MG tablet Take 1 mg by mouth 3 (three) times daily as needed for anxiety.      amLODipine (NORVASC) 10 MG tablet Take 1 tablet (10 mg total) by mouth daily. 90 tablet 1   aspirin 81 MG chewable tablet Chew 81 mg by mouth daily.     atorvastatin (LIPITOR) 40 MG tablet Take 1 tablet (40 mg total) by mouth daily. 90 tablet 1   esomeprazole (NEXIUM) 40 MG  capsule Take 40 mg by mouth every evening.      estradiol (ESTRACE) 1 MG tablet Take 1 mg by mouth daily.     HUMALOG KWIKPEN 100 UNIT/ML KwikPen SMARTSIG:10-20 Unit(s) SUB-Q 3 Times Daily     ibuprofen (ADVIL) 800 MG tablet Take 1 tablet (800 mg total) by mouth every 8 (eight) hours as needed. 60 tablet 0   insulin glargine (LANTUS) 100 UNIT/ML injection Inject 63 Units into the skin 2 (two) times daily. 50 units in the morning, and 10 units at bedtime     isosorbide mononitrate (IMDUR) 30 MG 24 hr tablet Take 1 tablet (30 mg total) by mouth daily. 90 tablet 1   labetalol (NORMODYNE) 100 MG tablet TAKE ONE TABLET BY MOUTH TWICE DAILY 60 tablet 2   levocetirizine (XYZAL) 5 MG tablet Take 5 mg by mouth every evening.     losartan-hydrochlorothiazide (HYZAAR) 100-25 MG tablet Take 1 tablet by mouth every morning.     meclizine (ANTIVERT) 25 MG tablet Take 25 mg by mouth daily as needed.     metFORMIN (GLUCOPHAGE) 500 MG tablet Take 500 mg by mouth 2 (two) times daily.     nitroGLYCERIN (NITROSTAT) 0.4 MG SL tablet Place 0.4 mg under the tongue every 5 (five) minutes as needed for chest pain.     oxycodone (ROXICODONE) 30 MG immediate release tablet Take 30 mg by mouth every 12 (twelve) hours as needed for pain.     ramelteon (ROZEREM) 8 MG tablet Take 8 mg by mouth at bedtime as needed.     spironolactone (ALDACTONE) 25 MG  tablet TAKE ONE TABLET BY MOUTH EVERY MORNING 90 tablet 2   No current facility-administered medications on file prior to visit.    PAST MEDICAL HISTORY: Past Medical History:  Diagnosis Date   Anxiety    Arthritis    Blood transfusion    Brain injury (HCC)    1 1/2 YR AGO   Chronic constipation    Chronic stomach ulcer    Concussion    HAS HAD DIZZINESS/ MEMORY PROBLEMS/ SLOW SPEACH /BALANCE PROBLEMS SINCE CONCUSSION 1 1/2 YR AGO PR GOES TO THERAPY   Concussion    Depression    Diabetes mellitus    Diabetes mellitus    Dizziness    Fibromyalgia    Full dentures     GERD (gastroesophageal reflux disease)    Headache(784.0)    Hernia    incisional   History of back surgery    multiple   Hyperlipidemia    Hypertension    MRSA (methicillin resistant Staphylococcus aureus)    Nocturia    Paroxysmal vertigo    Scar    Sleep difficulties    Stroke (HCC)    crica 2002   Umbilical hernia    Wears glasses     PAST SURGICAL HISTORY: Past Surgical History:  Procedure Laterality Date   ABDOMINAL HYSTERECTOMY     APPENDECTOMY     BACK SURGERY     SPINAL FUSION X6    CARDIAC CATHETERIZATION N/A 12/23/2015   Procedure: Left Heart Cath and Coronary Angiography;  Surgeon: Yates Decamp, MD;  Location: [facility] INVASIVE CV LAB;  Service: Cardiovascular;  Laterality: N/A;   CEREBRAL ANEURYSM REPAIR     CHOLECYSTECTOMY  1984   EYE SURGERY  2010   bilateral   HERNIA REPAIR     09/21/2010   HERNIA REPAIR  05/19/11   incisional hernia repair    INCISIONAL HERNIA REPAIR  05/19/2011   Procedure: HERNIA REPAIR INCISIONAL;  Surgeon: Iona Coach, MD;  Location: [facility] ORS;  Service: General;  Laterality: N/A;  repair incisional hernia with mesh   IR ANGIO INTRA EXTRACRAN SEL COM CAROTID INNOMINATE BILAT MOD SED  05/09/2018   IR ANGIO VERTEBRAL SEL VERTEBRAL BILAT MOD SED  05/09/2018   IR GENERIC HISTORICAL  09/30/2016   IR ANGIO VERTEBRAL SEL VERTEBRAL BILAT MOD SED 09/30/2016 Julieanne Cotton, MD [facility]-INTERV RAD   IR GENERIC HISTORICAL  09/30/2016   IR ANGIO INTRA EXTRACRAN SEL COM CAROTID INNOMINATE BILAT MOD SED 09/30/2016 Julieanne Cotton, MD [facility]   LEFT HEART CATH AND CORONARY ANGIOGRAPHY N/A 05/22/2019   Procedure: LEFT HEART CATH AND CORONARY ANGIOGRAPHY;  Surgeon: Yates Decamp, MD;  Location: [facility] INVASIVE CV LAB;  Service: Cardiovascular;  Laterality: N/A;   LEFT HEART CATHETERIZATION WITH CORONARY ANGIOGRAM N/A 11/05/2013   Procedure: LEFT HEART CATHETERIZATION WITH CORONARY ANGIOGRAM;  Surgeon: [name], MD;  Location: [facility] CATH LAB;  Service:  Cardiovascular;  Laterality: N/A;   SPINAL FUSION  2006   TUBAL LIGATION      FAMILY HISTORY: The patient's family history includes Allergies in her daughter; Breast cancer in her mother; Diabetes in her daughter and sister; Heart disease in her father; Scoliosis in her daughter.   SOCIAL HISTORY:  The patient  reports that she has been smoking cigarettes. She has a 27.00 pack-year smoking history. She has never used smokeless tobacco. She reports that she does not drink alcohol and does not use drugs.  Review of Systems  Cardiovascular:  Negative for chest pain,  claudication, dyspnea on exertion, irregular heartbeat, leg swelling, near-syncope, orthopnea, palpitations, paroxysmal nocturnal dyspnea and syncope.  Respiratory:  Negative for shortness of breath.   Hematologic/Lymphatic: Negative for bleeding problem.  Musculoskeletal:  Negative for muscle cramps and myalgias.  Neurological:  Negative for dizziness and light-headedness.    PHYSICAL EXAM:    01/04/2023    1:51 PM 07/09/2022    2:11 PM 05/11/2022    1:58 PM  Vitals with BMI  Height 5\' 5"  5\' 5"  5\' 5"   Weight 160 lbs 157 lbs 13 oz 154 lbs 10 oz  BMI 26.63 26.26 25.73  Systolic 137 127 161  Diastolic 71 83 69  Pulse 81 80 73   Physical Exam  Constitutional: No distress.  Age appropriate, hemodynamically stable.   Neck: No JVD present.  Cardiovascular: Normal rate, regular rhythm, S1 normal, S2 normal, intact distal pulses and normal pulses. Exam reveals no gallop, no S3 and no S4.  No murmur heard. Pulmonary/Chest: Effort normal and breath sounds normal. No stridor. She has no wheezes. She has no rales.  Abdominal: Soft. Bowel sounds are normal. She exhibits no distension. There is no abdominal tenderness.  Musculoskeletal:        General: No edema.     Cervical back: Neck supple.  Neurological: She is alert and oriented to person, place, and time. She has intact cranial nerves (2-12).  Skin: Skin is warm and moist.    CARDIAC DATABASE: EKG: January 04, 2023: Sinus rhythm, 68 bpm, normal axis, without underlying ischemia or injury pattern.  Echocardiogram: 04/27/2022: Left ventricle cavity is normal in size. Mild concentric hypertrophy of the left ventricle. Normal global wall motion. Normal LV systolic function with EF 62%. Doppler evidence of grade I (impaired) diastolic dysfunction, normal LAP.  Trileaflet aortic valve with aortic sclerosis (Mean PG 5 mmHg). No regurgitation. No evidence of pulmonary hypertension. Unlike study in 2020, MR not appreciated.    Stress Testing:  Nuclear stress test 12/05/2015:  Patient exercised on Bruce protocol for 5.0  minutes and achieved 4.64 METS. Stress test terminated due to target heart rate( 1.1% MPHR) and dyspnea.  Left ventricular cavity is noted to be enlarged on the stress study.  The TID index was 1.41.  SPECT images demonstrate homogeneous tracer distribution throughout the myocardium.   Gated SPECT images reveal normal myocardial thickening and wall motion.  LVEF visually estimated 63%.   Blood cells this is a high  risk study in view of abnormal EKG response and transient ischemic dilatation of LV noted on stress images.  clinical correlation recommended.  Heart Catheterization: Cardiac catheterization 05/22/2019: Normal LV systolic function, normal LVEDP.  No wall motion abnormality. Left main: Distal left main shows about a 30% stenosis. LAD mild to moderate diffusely diseased vessel, in the proximal segment diffuse 20 to 30% stenosis.  Large D1, D2 and D3, mild ostial disease. Circumflex: High origin of OM1 from the proximal segment, bifurcation and proximal segment has a 40 to 50% stenosis followed by tandem mid 40 to 50% stenosis. RCA: PDA has a 40 to 50% stenosis, large vessel.  Carotid artery duplex 04/27/2022: The bifurcation, internal, external and common carotid arteries reveal no evidence of significant stenosis, bilaterally. Antegrade right  vertebral artery flow. Antegrade left vertebral artery flow. COmpared to 03/02/2017, no significant change.   Renal artery duplex 06/14/2022: No evidence of renal artery occlusive disease in either renal artery. 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 Renal length is within normal limits for both kidneys.  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 Diffuse plaque observed in the abdominal aorta. Normal abdominal aorta flow velocities noted. Consider dedicated aortic duplex.  Abdominal Aortic Duplex 06/24/2022: Normal abdominal aorta. No evidence of aneurysm. Mildly elevated bilateral CIA velocity not hemodynamically significant. No significant plaque noted. Normal triphasic waveform pattern noted in the iliac vessels.   LABORATORY DATA: External Labs: Collected: 03/04/2022 provided by primary team. Total cholesterol 161, triglycerides 130, HDL 43, LDL 96, non-HDL 119,. BUN 13, creatinine 0.6. Sodium 139, potassium 4.7, chloride 105, bicarb 30 AST 19, ALT 19, alkaline phosphatase 104. Hemoglobin 13.4, hematocrit 40%. A1c 9. TSH 0.93  IMPRESSION:    ICD-10-CM   1. Benign hypertension  I10 EKG 12-Lead    2. Atherosclerosis of native coronary artery of native heart without angina pectoris  I25.10     3. Mixed hyperlipidemia  E78.2     4. Type 1 diabetes mellitus with hyperglycemia (HCC)  E10.65     5. Long-term insulin use (HCC)  Z79.4     6. Tobacco use disorder  F17.200 nicotine (NICODERM CQ - DOSED IN MG/24 HR) 7 mg/24hr patch    nicotine polacrilex (NICOTINE MINI) 2 MG lozenge       RECOMMENDATIONS: Mallory Mendez is a 67 y.o. female whose past medical history and cardiovascular risk factors include: Insulin-dependent diabetes mellitus type 1, hypertension, hyperlipidemia, history of cerebral aneurysm repair in 2012, continued tobacco abuse, moderate diffuse CAD by angiogram in 2017, postmenopausal female.  Benign  hypertension Home and office blood pressures are well-controlled. Home SBP around 130/69. Medications reconciled. No changes warranted at this time  Atherosclerosis of native coronary artery of native heart without angina pectoris Denies anginal chest pain. EKG nonischemic. Prior ischemic workup reviewed as part of medical decision making at today's office visit. Reemphasized the importance of secondary prevention with focus on improving her modifiable cardiovascular risk factors such as glycemic control, lipid management, blood pressure control, weight loss.  Mixed hyperlipidemia Currently on atorvastatin.   She denies myalgia or other side effects. Will have labs with PCP on April 11, 2023, advised her to send [place] a copy for reference.  Currently managed by primary care provider.  Tobacco use disorder Tobacco cessation counseling: Currently smoking 0.5 packs/day   Patient was informed of the dangers of tobacco abuse including stroke, cancer, and MI, as well as benefits of tobacco cessation. Patient is willing to quit at this time.  She is requesting assistance. 8 mins were spent counseling patient cessation techniques. We discussed various methods to help quit smoking, including deciding on a date to quit, joining a support group, pharmacological agents- nicotine gum/patch/lozenges.  Will prescribe nicotine patches to use on a daily basis and lozenges for as needed basis for breakthrough cravings  FINAL MEDICATION LIST END OF ENCOUNTER: Meds ordered this encounter  Medications   nicotine (NICODERM CQ - DOSED IN MG/24 HR) 7 mg/24hr patch    Sig: Place 1 patch (7 mg total) onto the skin daily for 30 doses.    Dispense:  28 patch    Refill:  0   nicotine polacrilex (NICOTINE MINI) 2 MG lozenge    Sig: Take 1 lozenge (2 mg total) by mouth as needed for up to 90 doses for smoking cessation.    Dispense:  100 tablet    Refill:  0    Current Outpatient Medications:    ALPRAZolam  (XANAX) 1 MG tablet, Take 1 mg by mouth 3 (three) times daily as needed  for anxiety. , Disp: , Rfl:    amLODipine (NORVASC) 10 MG tablet, Take 1 tablet (10 mg total) by mouth daily., Disp: 90 tablet, Rfl: 1   aspirin 81 MG chewable tablet, Chew 81 mg by mouth daily., Disp: , Rfl:    atorvastatin (LIPITOR) 40 MG tablet, Take 1 tablet (40 mg total) by mouth daily., Disp: 90 tablet, Rfl: 1   esomeprazole (NEXIUM) 40 MG capsule, Take 40 mg by mouth every evening. , Disp: , Rfl:    estradiol (ESTRACE) 1 MG tablet, Take 1 mg by mouth daily., Disp: , Rfl:    HUMALOG KWIKPEN 100 UNIT/ML KwikPen, SMARTSIG:10-20 Unit(s) SUB-Q 3 Times Daily, Disp: , Rfl:    ibuprofen (ADVIL) 800 MG tablet, Take 1 tablet (800 mg total) by mouth every 8 (eight) hours as needed., Disp: 60 tablet, Rfl: 0   insulin glargine (LANTUS) 100 UNIT/ML injection, Inject 63 Units into the skin 2 (two) times daily. 50 units in the morning, and 10 units at bedtime, Disp: , Rfl:    isosorbide mononitrate (IMDUR) 30 MG 24 hr tablet, Take 1 tablet (30 mg total) by mouth daily., Disp: 90 tablet, Rfl: 1   labetalol (NORMODYNE) 100 MG tablet, TAKE ONE TABLET BY MOUTH TWICE DAILY, Disp: 60 tablet, Rfl: 2   levocetirizine (XYZAL) 5 MG tablet, Take 5 mg by mouth every evening., Disp: , Rfl:    losartan-hydrochlorothiazide (HYZAAR) 100-25 MG tablet, Take 1 tablet by mouth every morning., Disp: , Rfl:    meclizine (ANTIVERT) 25 MG tablet, Take 25 mg by mouth daily as needed., Disp: , Rfl:    metFORMIN (GLUCOPHAGE) 500 MG tablet, Take 500 mg by mouth 2 (two) times daily., Disp: , Rfl:    nicotine (NICODERM CQ - DOSED IN MG/24 HR) 7 mg/24hr patch, Place 1 patch (7 mg total) onto the skin daily for 30 doses., Disp: 28 patch, Rfl: 0   nicotine polacrilex (NICOTINE MINI) 2 MG lozenge, Take 1 lozenge (2 mg total) by mouth as needed for up to 90 doses for smoking cessation., Disp: 100 tablet, Rfl: 0   nitroGLYCERIN (NITROSTAT) 0.4 MG SL tablet, Place 0.4 mg  under the tongue every 5 (five) minutes as needed for chest pain., Disp: , Rfl:    oxycodone (ROXICODONE) 30 MG immediate release tablet, Take 30 mg by mouth every 12 (twelve) hours as needed for pain., Disp: , Rfl:    ramelteon (ROZEREM) 8 MG tablet, Take 8 mg by mouth at bedtime as needed., Disp: , Rfl:    spironolactone (ALDACTONE) 25 MG tablet, TAKE ONE TABLET BY MOUTH EVERY MORNING, Disp: 90 tablet, Rfl: 2  Orders Placed This Encounter  Procedures   EKG 12-Lead   --Continue cardiac medications as reconciled in final medication list. --Return in about 6 months (around 07/07/2023) for Follow up, BP. Or sooner if needed. --Continue follow-up with your primary care physician regarding the management of your other chronic comorbid conditions.  There are no Patient Instructions on file for this visit.  Patient's questions and concerns were addressed to her satisfaction. She voices understanding of the instructions provided during this encounter.   This note was created using a voice recognition software as a result there may be grammatical errors inadvertently enclosed that do not reflect the nature of this encounter. Every attempt is made to correct such errors.  Tessa Lerner, [place], [facility]  Pager:  [phone] Office: [phone]

## 2023-01-31 ENCOUNTER — Other Ambulatory Visit: Payer: Self-pay | Admitting: Cardiology

## 2023-01-31 DIAGNOSIS — F17200 Nicotine dependence, unspecified, uncomplicated: Secondary | ICD-10-CM

## 2023-03-02 DIAGNOSIS — I10 Essential (primary) hypertension: Secondary | ICD-10-CM | POA: Diagnosis not present

## 2023-03-02 DIAGNOSIS — E1142 Type 2 diabetes mellitus with diabetic polyneuropathy: Secondary | ICD-10-CM | POA: Diagnosis not present

## 2023-03-02 DIAGNOSIS — Z7984 Long term (current) use of oral hypoglycemic drugs: Secondary | ICD-10-CM | POA: Diagnosis not present

## 2023-03-02 DIAGNOSIS — Z794 Long term (current) use of insulin: Secondary | ICD-10-CM | POA: Diagnosis not present

## 2023-03-02 DIAGNOSIS — E782 Mixed hyperlipidemia: Secondary | ICD-10-CM | POA: Diagnosis not present

## 2023-04-05 DIAGNOSIS — I10 Essential (primary) hypertension: Secondary | ICD-10-CM | POA: Diagnosis not present

## 2023-04-05 DIAGNOSIS — F411 Generalized anxiety disorder: Secondary | ICD-10-CM | POA: Diagnosis not present

## 2023-04-05 DIAGNOSIS — F0781 Postconcussional syndrome: Secondary | ICD-10-CM | POA: Diagnosis not present

## 2023-04-05 DIAGNOSIS — R300 Dysuria: Secondary | ICD-10-CM | POA: Diagnosis not present

## 2023-04-05 DIAGNOSIS — E109 Type 1 diabetes mellitus without complications: Secondary | ICD-10-CM | POA: Diagnosis not present

## 2023-04-05 DIAGNOSIS — E7800 Pure hypercholesterolemia, unspecified: Secondary | ICD-10-CM | POA: Diagnosis not present

## 2023-04-05 DIAGNOSIS — I25119 Atherosclerotic heart disease of native coronary artery with unspecified angina pectoris: Secondary | ICD-10-CM | POA: Diagnosis not present

## 2023-04-05 DIAGNOSIS — E1065 Type 1 diabetes mellitus with hyperglycemia: Secondary | ICD-10-CM | POA: Diagnosis not present

## 2023-04-05 DIAGNOSIS — G894 Chronic pain syndrome: Secondary | ICD-10-CM | POA: Diagnosis not present

## 2023-04-05 DIAGNOSIS — I700 Atherosclerosis of aorta: Secondary | ICD-10-CM | POA: Diagnosis not present

## 2023-04-12 DIAGNOSIS — Z716 Tobacco abuse counseling: Secondary | ICD-10-CM | POA: Diagnosis not present

## 2023-04-12 DIAGNOSIS — F17210 Nicotine dependence, cigarettes, uncomplicated: Secondary | ICD-10-CM | POA: Diagnosis not present

## 2023-04-12 DIAGNOSIS — Z0000 Encounter for general adult medical examination without abnormal findings: Secondary | ICD-10-CM | POA: Diagnosis not present

## 2023-04-13 ENCOUNTER — Encounter: Payer: Self-pay | Admitting: Family Medicine

## 2023-04-13 DIAGNOSIS — Z1231 Encounter for screening mammogram for malignant neoplasm of breast: Secondary | ICD-10-CM

## 2023-04-15 NOTE — Progress Notes (Deleted)
   Rubin Payor, PhD, LAT, ATC acting as a scribe for Clementeen Graham, MD.  Mallory Mendez is a 67 y.o. female who presents to [facility] at [facility] today for R shoulder pain x ***. She is a pt of [facility] for chronic pain syndrome. Pt locates pain to ***  Radiates: Aggravates: Treatments tried:  Dx testing: 07/18/21 C-spine MRI  Pertinent review of systems: ***  Relevant historical information: ***   Exam:  There were no vitals taken for this visit. General: Well Developed, well nourished, and in no acute distress.   MSK: ***    Lab and Radiology Results No results found for this or any previous visit (from the past 72 hour(s)). No results found.     Assessment and Plan: 67 y.o. female with ***   PDMP not reviewed this encounter. No orders of the defined types were placed in this encounter.  No orders of the defined types were placed in this encounter.    Discussed warning signs or symptoms. Please see discharge instructions. Patient expresses understanding.   ***

## 2023-04-19 ENCOUNTER — Other Ambulatory Visit: Payer: Self-pay | Admitting: Family Medicine

## 2023-04-19 ENCOUNTER — Ambulatory Visit: Payer: Medicare HMO | Admitting: Family Medicine

## 2023-04-19 DIAGNOSIS — N644 Mastodynia: Secondary | ICD-10-CM

## 2023-04-28 ENCOUNTER — Encounter: Payer: Self-pay | Admitting: Family Medicine

## 2023-04-28 ENCOUNTER — Ambulatory Visit: Payer: Medicare HMO

## 2023-04-28 ENCOUNTER — Other Ambulatory Visit: Payer: Self-pay

## 2023-04-28 ENCOUNTER — Ambulatory Visit: Payer: Medicare HMO | Admitting: Family Medicine

## 2023-04-28 VITALS — BP 150/92 | HR 69 | Ht 65.0 in | Wt 157.0 lb

## 2023-04-28 DIAGNOSIS — M19011 Primary osteoarthritis, right shoulder: Secondary | ICD-10-CM | POA: Diagnosis not present

## 2023-04-28 DIAGNOSIS — M542 Cervicalgia: Secondary | ICD-10-CM

## 2023-04-28 DIAGNOSIS — M25511 Pain in right shoulder: Secondary | ICD-10-CM

## 2023-04-28 DIAGNOSIS — M47812 Spondylosis without myelopathy or radiculopathy, cervical region: Secondary | ICD-10-CM | POA: Diagnosis not present

## 2023-04-28 DIAGNOSIS — M438X2 Other specified deforming dorsopathies, cervical region: Secondary | ICD-10-CM | POA: Diagnosis not present

## 2023-04-28 MED ORDER — TIZANIDINE HCL 4 MG PO TABS: 4.0000 mg | ORAL_TABLET | Freq: Three times a day (TID) | ORAL | 1 refills | Status: AC | PRN

## 2023-04-28 NOTE — Progress Notes (Signed)
I, Stevenson Clinch, CMA acting as a scribe for Clementeen Graham, MD.  Mallory Mendez is a 67 y.o. female who presents to [facility] at [facility] today for R shoulder pain x 5 months, worsening over the past 2 months. She is a pt of [facility] for chronic pain syndrome. Pt locates pain to anterior aspect of the shoulder. Having pain in the upper back and periscapular region while laying down. Pain radiating into the upper arm. Denies n/t in the hands. Notes limited ROM in the 3rd and 4th fingers, dropping things, feels cold to touch. Some neck pain, also having more HA's.   Radiates: upper arm Aggravates: reaching up, reaching back Treatments tried: BioFreeze, IBU, ice/heat  Dx testing: 07/18/21 C-spine MRI  Pertinent review of systems: No fevers or chills  Relevant historical information: Diabetes.  History of head injury.   Exam:  BP (!) 150/92   Pulse 69   Ht 5\' 5"  (1.651 m)   Wt 157 lb (71.2 kg)   SpO2 98%   BMI 26.13 kg/m  General: Well Developed, well nourished, and in no acute distress.   MSK: C-spine: Normal appearing. Nontender palpation spinal midline.  Tender palpation right cervical paraspinal musculature and right trapezius. Upper extremity strength and reflexes are intact.  Right shoulder: Normal-appearing Nontender. Normal motion. Intact strength. Mildly positive Hawking's test.  Mildly positive Neer's test. Negative Yergason's and speeds test.   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  No acute fracture.  Right shoulder: AC DJD.  No severe glenohumeral DJD.  No acute fractures.  Await formal radiology review     Assessment and Plan: 67 y.o. female with pain located in the right periscapular and shoulder region.  I believe the majority of her pain is muscle dysfunction of the trapezius and  periscapular muscles.  She may have some shoulder impingement or bursitis.  I do not think there is much cervical radiculopathy but that could be a component as well.  She is a good candidate for physical therapy.  Plan to refer to PT and recheck in 1 month.  I did prescribe a muscle relaxer that she can use at bedtime as well.  If not improved consider injection or even MRI.   PDMP not reviewed this encounter. Orders Placed This Encounter  Procedures   DG Shoulder Right    Standing Status:   Future    Number of Occurrences:   1    Standing Expiration Date:   04/27/2024    Order Specific Question:   Reason for Exam (SYMPTOM  OR DIAGNOSIS REQUIRED)    Answer:   right shoulder pain    Order Specific Question:   Preferred imaging location?    Answer:   [facility]   DG Cervical Spine 2 or 3 views    Standing Status:   Future    Number of Occurrences:   1    Standing Expiration Date:   04/27/2024    Order Specific Question:   Reason for Exam (SYMPTOM  OR DIAGNOSIS REQUIRED)    Answer:   neck and right shoulder pain    Order Specific Question:   Preferred imaging location?    Answer:   [facility]   Ambulatory referral to Physical Therapy    Referral Priority:   Routine    Referral Type:   Physical Medicine  Referral Reason:   Specialty Services Required    Requested Specialty:   Physical Therapy    Number of Visits Requested:   1   Meds ordered this encounter  Medications   tiZANidine (ZANAFLEX) 4 MG tablet    Sig: Take 1 tablet (4 mg total) by mouth every 8 (eight) hours as needed.    Dispense:  30 tablet    Refill:  1     Discussed warning signs or symptoms. Please see discharge instructions. Patient expresses understanding.   The above documentation has been reviewed and is accurate and complete Clementeen Graham, M.D.

## 2023-04-28 NOTE — Patient Instructions (Addendum)
Thank you for coming in today.   I've sent a prescription for tizanidine to your pharmacy.  I've referred you to Physical Therapy.  Let us know if you don't hear from them in one week.   Check back in 1 month

## 2023-05-03 ENCOUNTER — Ambulatory Visit: Payer: Medicare HMO

## 2023-05-03 ENCOUNTER — Ambulatory Visit
Admission: RE | Admit: 2023-05-03 | Discharge: 2023-05-03 | Disposition: A | Discharge status: Home / Self Care | Payer: Medicare HMO | Source: Ambulatory Visit | Attending: Family Medicine | Admitting: Family Medicine

## 2023-05-03 DIAGNOSIS — N644 Mastodynia: Secondary | ICD-10-CM | POA: Diagnosis not present

## 2023-05-05 DIAGNOSIS — F4323 Adjustment disorder with mixed anxiety and depressed mood: Secondary | ICD-10-CM | POA: Diagnosis not present

## 2023-05-11 ENCOUNTER — Ambulatory Visit: Payer: Medicare HMO

## 2023-05-20 NOTE — Therapy (Unsigned)
OUTPATIENT PHYSICAL THERAPY SHOULDER EVALUATION   Patient Name: Mallory Mendez MRN: [id number] DOB:October 06, 1955, 67 y.o., female Today's Date: 05/20/2023  END OF SESSION:   Past Medical History:  Diagnosis Date   Anxiety    Arthritis    Blood transfusion    Brain injury (HCC)    1 1/2 YR AGO   Chronic constipation    Chronic stomach ulcer    Concussion    HAS HAD DIZZINESS/ MEMORY PROBLEMS/ SLOW SPEACH /BALANCE PROBLEMS SINCE CONCUSSION 1 1/2 YR AGO PR GOES TO THERAPY   Concussion    Depression    Diabetes mellitus    Diabetes mellitus    Dizziness    Fibromyalgia    Full dentures    GERD (gastroesophageal reflux disease)    Headache(784.0)    Hernia    incisional   History of back surgery    multiple   Hyperlipidemia    Hypertension    MRSA (methicillin resistant Staphylococcus aureus)    Nocturia    Paroxysmal vertigo    Scar    Sleep difficulties    Stroke (HCC)    crica 2002   Umbilical hernia    Wears glasses    Past Surgical History:  Procedure Laterality Date   ABDOMINAL HYSTERECTOMY     APPENDECTOMY     BACK SURGERY     SPINAL FUSION X6    CARDIAC CATHETERIZATION N/A 12/23/2015   Procedure: Left Heart Cath and Coronary Angiography;  Surgeon: Yates Decamp, MD;  Location: [facility] INVASIVE CV LAB;  Service: Cardiovascular;  Laterality: N/A;   CEREBRAL ANEURYSM REPAIR     CHOLECYSTECTOMY  1984   EYE SURGERY  2010   bilateral   HERNIA REPAIR     09/21/2010   HERNIA REPAIR  05/19/11   incisional hernia repair    INCISIONAL HERNIA REPAIR  05/19/2011   Procedure: HERNIA REPAIR INCISIONAL;  Surgeon: Iona Coach, MD;  Location: [facility];  Service: General;  Laterality: N/A;  repair incisional hernia with mesh   IR ANGIO INTRA EXTRACRAN SEL COM CAROTID INNOMINATE BILAT MOD SED  05/09/2018   IR ANGIO VERTEBRAL SEL VERTEBRAL BILAT MOD SED  05/09/2018   IR GENERIC HISTORICAL  09/30/2016   IR ANGIO VERTEBRAL SEL VERTEBRAL BILAT MOD SED 09/30/2016 Julieanne Cotton, MD [facility]   IR GENERIC HISTORICAL  09/30/2016   IR ANGIO INTRA EXTRACRAN SEL COM CAROTID INNOMINATE BILAT MOD SED 09/30/2016 Julieanne Cotton, MD [facility]   LEFT HEART CATH AND CORONARY ANGIOGRAPHY N/A 05/22/2019   Procedure: LEFT HEART CATH AND CORONARY ANGIOGRAPHY;  Surgeon: Yates Decamp, MD;  Location: [facility] INVASIVE CV LAB;  Service: Cardiovascular;  Laterality: N/A;   LEFT HEART CATHETERIZATION WITH CORONARY ANGIOGRAM N/A 11/05/2013   Procedure: LEFT HEART CATHETERIZATION WITH CORONARY ANGIOGRAM;  Surgeon: [name], MD;  Location: [facility] CATH LAB;  Service: Cardiovascular;  Laterality: N/A;   SPINAL FUSION  2006   TUBAL LIGATION     Patient Active Problem List   Diagnosis Date Noted   CAD (coronary artery disease), native coronary artery 12/23/2015   Angina pectoris (HCC) 12/22/2015   Chest pain 10/30/2013   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) 10/17/2013   Dyspnea 09/21/2013   Smoker 09/21/2013   HBP (high blood pressure) 09/21/2013   Myofascial muscle pain 08/20/2013   Basilar artery narrowing 07/28/2012   Anxiety and depression 05/30/2012   Abdominal pain 12/01/2011   Concussion    Diabetes mellitus (HCC)  Paroxysmal vertigo    Umbilical hernia 04/06/2011   Post concussive syndrome 03/29/2011   Benign paroxysmal positional vertigo 01/27/2011    PCP: Noberto Retort, MD  REFERRING PROVIDER: Rodolph Bong, MD  REFERRING DIAG:  Diagnosis  M25.511 (ICD-10-CM) - Right shoulder pain, unspecified chronicity  M54.2 (ICD-10-CM) - Cervicalgia    THERAPY DIAG:  No diagnosis found.  Rationale for Evaluation and Treatment: Rehabilitation  ONSET DATE: 04/28/23  SUBJECTIVE:                                                                                                                                                                                      SUBJECTIVE STATEMENT: Patient reports 4 month H/O R shoulder  pain, started to bother her when lifting, now also affects sleep and beginning to hurt all the time. Has some nodules and pain in R wrist which is most likely unrelated. Hand dominance: Right  PERTINENT HISTORY: Chronic pain Per referring physician note: 67 y.o. female with pain located in the right periscapular and shoulder region.  I believe the majority of her pain is muscle dysfunction of the trapezius and periscapular muscles.  She may have some shoulder impingement or bursitis.  I do not think there is much cervical radiculopathy but that could be a component as well.   PAIN:  Are you having pain? Yes: NPRS scale: 8/10 Pain location: R shoulder into back and under her arm. Pain description: Can be sharp, constant burning Aggravating factors: lying on it, lying on her back, moving it "the wrong way" Relieving factors: Bio Freeze eases pain temporarily.  PRECAUTIONS: Shoulder  RED FLAGS: None   WEIGHT BEARING RESTRICTIONS: No  FALLS:  Has patient fallen in last 6 months? No  LIVING ENVIRONMENT: Lives with: lives with their family Lives in: House/apartment Stairs: Yes: External: 3 steps; none Has following equipment at home: None  OCCUPATION: Retired  PLOF: Independent, the pain is starting to affect her ability to brush her teeth or brush hair, etc.  PATIENT GOALS:Patient would like to get some pain relief to return to her normal activities.  NEXT MD VISIT:   OBJECTIVE:  Note: Objective measures were completed at Evaluation unless otherwise noted.  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  No acute fracture.   Right shoulder: AC DJD.  No severe glenohumeral DJD.  No acute fractures.  PATIENT SURVEYS:  Quick Dash 70.5%  COGNITION: Overall cognitive status: Within functional limits for tasks  assessed     SENSATION: WFL  POSTURE: Rounded shoulders, forward head, increased thoracic kyphosis  CERVICAL ROM: 80% flex/ext, mild pain in ext, 50% B lat flex, mild pain, B rotation 60%  CERVICAL SPECIAL TESTS: Spurling's (+) B, R >L  UPPER EXTREMITY ROM: L WNL, R elbow ext WNL, but felt tightness into neck   Active ROM Right eval Left eval  Shoulder flexion 82   Shoulder extension    Shoulder abduction 58   Shoulder adduction    Shoulder internal rotation wfl   Shoulder external rotation 39   Elbow flexion    Elbow extension    Wrist flexion    Wrist extension    Wrist ulnar deviation    Wrist radial deviation    Wrist pronation    Wrist supination    (Blank rows = not tested)  UPPER EXTREMITY MMT: R deferred due to pain, L shoulder 3+/5 due to pain in R shoulder and neck   SHOULDER SPECIAL TESTS: All deferred due to inability to tolerate test positions. Impingement tests:  SLAP lesions:  Instability tests:  Rotator cuff assessment:  Biceps assessment:   JOINT MOBILITY TESTING:  Unable to accurately assess due to pain.  PALPATION:  Increased spasm throughout cervical paraspinals, UT, R scalenes, Pect B, R >L, basically entire upper quadrant on R.   TODAY'S TREATMENT:                                                                                                                                         DATE:  05/23/23 Education  PATIENT EDUCATION: Education details: POC Person educated: Patient Education method: Explanation Education comprehension: verbalized understanding  HOME EXERCISE PROGRAM:  [id number]  ASSESSMENT:  CLINICAL IMPRESSION: Patient is a 67 y.o. who was seen today for physical therapy evaluation and treatment for R shoulder pain. She tested positive for cervical impingement on R, most other assessment deferred due to inability to tolerate testing. Severe spasms noted throughout R upper quadrant as well as some on the L. Reported H/A  after eval complete. She guards severely and has great difficulty relaxing, so started with some very gentle ROM, including pendulum exercises, deep breathing, gentle self traction pulling on a door frame, and table slides. She will greatly benefit from PT as her symptoms are currently exacerbating and causing pain with any activity. She cannot sleep, is at risk of continued regression and is already showing signs of frozen shoulder.   OBJECTIVE IMPAIRMENTS: decreased activity tolerance, decreased coordination, decreased ROM, decreased strength, increased muscle spasms, impaired flexibility, impaired UE functional use, improper body mechanics, and pain.   ACTIVITY LIMITATIONS: carrying, lifting, bathing, and reach over head  PARTICIPATION LIMITATIONS: meal prep, cleaning, laundry, shopping, and community activity  PERSONAL FACTORS: Past/current experiences are also affecting patient's functional outcome.   REHAB POTENTIAL: Good  CLINICAL DECISION MAKING: Stable/uncomplicated  EVALUATION COMPLEXITY: Moderate   GOALS: Goals reviewed with patient? Yes  SHORT TERM GOALS: Target date: 06/06/23  I with initial HEP  Baseline: Goal status: INITIAL  LONG TERM GOALS: Target date: 08/01/23  I with final HEP Baseline:  Goal status: INITIAL  2.  Achieve R shoulder AROM equal to L with pain < 3/10 Baseline:  Goal status: INITIAL  3.  R shoulder strength to at least 4/5 Baseline:  Goal status: INITIAL  4.  Improve QUICK DASH score by at least 35% Baseline:  Goal status: INITIAL  5.  Patient will report the ability to complete all self care and daily tasks using her R UE normally, with pain < 3/10 at the end of the day. Baseline:  Goal status: INITIAL  PLAN:  PT FREQUENCY: 2x/week  PT DURATION: 10 weeks  PLANNED INTERVENTIONS: 97110-Therapeutic exercises, 97530- Therapeutic activity, [id number]- Neuromuscular re-education, 97535- Self Care, 40981- Manual therapy, 97014- Electrical  stimulation (unattended), 97016- Vasopneumatic device, [id number]- Traction (mechanical), [id number]- Ionotophoresis 4mg /ml Dexamethasone, Patient/Family education, Taping, Dry Needling, Joint mobilization, Spinal mobilization, Cryotherapy, and Moist heat  PLAN FOR NEXT SESSION: Treatment for acute pain relief and muscle spasms, update HEP as tolerated.   Iona Beard, DPT 05/20/2023, 9:12 AM

## 2023-05-23 ENCOUNTER — Encounter: Payer: Self-pay | Admitting: Physical Therapy

## 2023-05-23 ENCOUNTER — Ambulatory Visit: Payer: Medicare HMO | Attending: Family Medicine | Admitting: Physical Therapy

## 2023-05-23 DIAGNOSIS — R600 Localized edema: Secondary | ICD-10-CM | POA: Insufficient documentation

## 2023-05-23 DIAGNOSIS — M25611 Stiffness of right shoulder, not elsewhere classified: Secondary | ICD-10-CM | POA: Insufficient documentation

## 2023-05-23 DIAGNOSIS — M25511 Pain in right shoulder: Secondary | ICD-10-CM | POA: Insufficient documentation

## 2023-05-23 DIAGNOSIS — M542 Cervicalgia: Secondary | ICD-10-CM | POA: Insufficient documentation

## 2023-05-23 DIAGNOSIS — M6281 Muscle weakness (generalized): Secondary | ICD-10-CM | POA: Diagnosis not present

## 2023-05-23 DIAGNOSIS — G8929 Other chronic pain: Secondary | ICD-10-CM | POA: Insufficient documentation

## 2023-05-23 NOTE — Progress Notes (Signed)
Cervical spine x-ray shows mild arthritis

## 2023-05-23 NOTE — Progress Notes (Signed)
Right shoulder x-ray shows mild arthritis

## 2023-05-25 ENCOUNTER — Ambulatory Visit: Payer: Medicare HMO | Admitting: Family Medicine

## 2023-05-25 NOTE — Progress Notes (Deleted)
   Rubin Payor, PhD, LAT, ATC acting as a scribe for Clementeen Graham, MD.  Mallory Mendez is a 67 y.o. female who presents to [facility] at [facility] today for 33-month f/u R shoulder/periscapular pain. Pt was last seen by Dr. Denyse Amass on 04/28/23 and was prescribed tizanidine, and referred to PT, completing 1 visit.  Today, pt reports ***  Dx testing: 04/28/23 R shoulder & C-spine XR 07/18/21 C-spine MRI   Pertinent review of systems: ***  Relevant historical information: ***   Exam:  There were no vitals taken for this visit. General: Well Developed, well nourished, and in no acute distress.   MSK: ***    Lab and Radiology Results No results found for this or any previous visit (from the past 72 hour(s)). No results found.     Assessment and Plan: 67 y.o. female with ***   PDMP not reviewed this encounter. No orders of the defined types were placed in this encounter.  No orders of the defined types were placed in this encounter.    Discussed warning signs or symptoms. Please see discharge instructions. Patient expresses understanding.   ***

## 2023-05-30 ENCOUNTER — Other Ambulatory Visit: Payer: Self-pay

## 2023-05-30 ENCOUNTER — Encounter: Payer: Self-pay | Admitting: Family Medicine

## 2023-05-30 ENCOUNTER — Ambulatory Visit: Payer: Medicare HMO | Admitting: Family Medicine

## 2023-05-30 VITALS — BP 156/98 | HR 76 | Ht 65.0 in | Wt 153.0 lb

## 2023-05-30 DIAGNOSIS — M25511 Pain in right shoulder: Secondary | ICD-10-CM | POA: Diagnosis not present

## 2023-05-30 DIAGNOSIS — G8929 Other chronic pain: Secondary | ICD-10-CM

## 2023-05-30 NOTE — Patient Instructions (Signed)
Thank you for coming in today.   Call or go to the ER if you develop a large red swollen joint with extreme pain or oozing puss.    If this does not work well let me know. Next step is probably a MRI of the shoulder.

## 2023-05-30 NOTE — Progress Notes (Unsigned)
I, Stevenson Clinch, CMA acting as a scribe for Clementeen Graham, MD.  Mallory Mendez is a 67 y.o. female who presents to [facility] at [facility] today for 82-month f/u R shoulder/periscapular pain. Pt was last seen by Dr. Denyse Amass on 04/28/23 and was prescribed tizanidine, and referred to PT, completing 1 visit.  Today, pt reports worsening shoulder sx. Continues to have constant pain. Unable to wear a bra d/t the pain. Minimal relief with Tizanidine, felt drowsy in the mornings.   Dx testing: 04/28/23 R shoulder & C-spine XR 07/18/21 C-spine MRI   Pertinent review of systems: No fevers or chills  Relevant historical information: Diabetes   Exam:  BP (!) 156/98   Pulse 76   Ht 5\' 5"  (1.651 m)   Wt 153 lb (69.4 kg)   SpO2 99%   BMI 25.46 kg/m  General: Well Developed, well nourished, and in no acute distress.   MSK: Right shoulder normal-appearing Pain with abduction.  Intact strength    Lab and Radiology Results  Procedure: Real-time Ultrasound Guided Injection of right shoulder subacromial bursa Device: Philips Affiniti 50G/GE Logiq Images permanently stored and available for review in PACS Verbal informed consent obtained.  Discussed risks and benefits of procedure. Warned about infection, bleeding, hyperglycemia damage to structures among others. Patient expresses understanding and agreement Time-out conducted.   Noted no overlying erythema, induration, or other signs of local infection.   Skin prepped in a sterile fashion.   Local anesthesia: Topical Ethyl chloride.   With sterile technique and under real time ultrasound guidance: 40 mg of Kenalog and 2 ml of Marcaine injected into bursa. Fluid seen entering the bursa.   Completed without difficulty   Pain moderately resolved suggesting accurate placement of the medication.   Advised to call if fevers/chills, erythema, induration, drainage, or persistent bleeding.   Images permanently stored and  available for review in the ultrasound unit.  Impression: Technically successful ultrasound guided injection.    EXAM: RIGHT SHOULDER - 2+ VIEW   COMPARISON:  07/30/2009.   FINDINGS: No acute fracture or dislocation.   No aggressive osseous lesion.   Glenohumeral and acromioclavicular joints are normal in alignment and exhibit mild degenerative changes.   No soft tissue swelling.   No radiopaque foreign bodies.   IMPRESSION: *No acute osseous abnormality of the right shoulder joint. Mild degenerative joint disease.     Electronically Signed   By: Jules Schick M.D.   On: 05/22/2023 10:04   I, Clementeen Graham, personally (independently) visualized and performed the interpretation of the images attached in this note.      Assessment and Plan: 67 y.o. female with persistent right shoulder pain thought to be primarily due to subacromial bursitis and impingement.  She may have some component of radiculopathy but this is less likely.  Plan for subacromial injection today.  If not improved would recommend MRI shoulder.  She will let me know.  Continue home exercises.  If we are pursuing MRI we can pause on PT.   PDMP not reviewed this encounter. Orders Placed This Encounter  Procedures   [place] LIMITED JOINT SPACE STRUCTURES UP RIGHT(NO LINKED CHARGES)    Order Specific Question:   Reason for Exam (SYMPTOM  OR DIAGNOSIS REQUIRED)    Answer:   right shoulder pain    Order Specific Question:   Preferred imaging location?    Answer:   [facility]   No orders of the defined types were placed  in this encounter.    Discussed warning signs or symptoms. Please see discharge instructions. Patient expresses understanding.   The above documentation has been reviewed and is accurate and complete Clementeen Graham, M.D.

## 2023-06-04 DIAGNOSIS — F4323 Adjustment disorder with mixed anxiety and depressed mood: Secondary | ICD-10-CM | POA: Diagnosis not present

## 2023-06-07 ENCOUNTER — Encounter: Payer: Self-pay | Admitting: Physical Therapy

## 2023-06-07 ENCOUNTER — Ambulatory Visit: Payer: Medicare HMO | Attending: Family Medicine | Admitting: Physical Therapy

## 2023-06-07 DIAGNOSIS — M542 Cervicalgia: Secondary | ICD-10-CM | POA: Diagnosis not present

## 2023-06-07 DIAGNOSIS — M25511 Pain in right shoulder: Secondary | ICD-10-CM | POA: Diagnosis not present

## 2023-06-07 DIAGNOSIS — G8929 Other chronic pain: Secondary | ICD-10-CM

## 2023-06-07 DIAGNOSIS — M25611 Stiffness of right shoulder, not elsewhere classified: Secondary | ICD-10-CM | POA: Diagnosis not present

## 2023-06-07 DIAGNOSIS — R600 Localized edema: Secondary | ICD-10-CM

## 2023-06-07 DIAGNOSIS — M6281 Muscle weakness (generalized): Secondary | ICD-10-CM

## 2023-06-07 NOTE — Therapy (Signed)
OUTPATIENT PHYSICAL THERAPY SHOULDER EVALUATION   Patient Name: Mallory Mendez MRN: [id number] DOB:[birth year], 67 y.o., female Today's Date: 06/07/2023  END OF SESSION:  PT End of Session - 06/07/23 1431     Visit Number 2    Date for PT Re-Evaluation 08/01/23    PT Start Time 1430    PT Stop Time 1515    PT Time Calculation (min) 45 min    Activity Tolerance Patient limited by pain    Behavior During Therapy WFL for tasks assessed/performed             Past Medical History:  Diagnosis Date   Anxiety    Arthritis    Blood transfusion    Brain injury (HCC)    1 1/2 YR AGO   Chronic constipation    Chronic stomach ulcer    Concussion    HAS HAD DIZZINESS/ MEMORY PROBLEMS/ SLOW SPEACH /BALANCE PROBLEMS SINCE CONCUSSION 1 1/2 YR AGO PR GOES TO THERAPY   Concussion    Depression    Diabetes mellitus    Diabetes mellitus    Dizziness    Fibromyalgia    Full dentures    GERD (gastroesophageal reflux disease)    Headache(784.0)    Hernia    incisional   History of back surgery    multiple   Hyperlipidemia    Hypertension    MRSA (methicillin resistant Staphylococcus aureus)    Nocturia    Paroxysmal vertigo    Scar    Sleep difficulties    Stroke (HCC)    crica 2002   Umbilical hernia    Wears glasses    Past Surgical History:  Procedure Laterality Date   ABDOMINAL HYSTERECTOMY     APPENDECTOMY     BACK SURGERY     SPINAL FUSION X6    CARDIAC CATHETERIZATION N/A 12/23/2015   Procedure: Left Heart Cath and Coronary Angiography;  Surgeon: Yates Decamp, MD;  Location: [facility] INVASIVE CV LAB;  Service: Cardiovascular;  Laterality: N/A;   CEREBRAL ANEURYSM REPAIR     CHOLECYSTECTOMY  1984   EYE SURGERY  2010   bilateral   HERNIA REPAIR     09/21/2010   HERNIA REPAIR  05/19/11   incisional hernia repair    INCISIONAL HERNIA REPAIR  05/19/2011   Procedure: HERNIA REPAIR INCISIONAL;  Surgeon: Iona Coach, MD;  Location: [facility];  Service: General;   Laterality: N/A;  repair incisional hernia with mesh   IR ANGIO INTRA EXTRACRAN SEL COM CAROTID INNOMINATE BILAT MOD SED  05/09/2018   IR ANGIO VERTEBRAL SEL VERTEBRAL BILAT MOD SED  05/09/2018   IR GENERIC HISTORICAL  09/30/2016   IR ANGIO VERTEBRAL SEL VERTEBRAL BILAT MOD SED 09/30/2016 Julieanne Cotton, MD [facility]   IR GENERIC HISTORICAL  09/30/2016   IR ANGIO INTRA EXTRACRAN SEL COM CAROTID INNOMINATE BILAT MOD SED 09/30/2016 Julieanne Cotton, MD [facility]   LEFT HEART CATH AND CORONARY ANGIOGRAPHY N/A 05/22/2019   Procedure: LEFT HEART CATH AND CORONARY ANGIOGRAPHY;  Surgeon: Yates Decamp, MD;  Location: [facility] INVASIVE CV LAB;  Service: Cardiovascular;  Laterality: N/A;   LEFT HEART CATHETERIZATION WITH CORONARY ANGIOGRAM N/A 11/05/2013   Procedure: LEFT HEART CATHETERIZATION WITH CORONARY ANGIOGRAM;  Surgeon: [name], MD;  Location: [facility] CATH LAB;  Service: Cardiovascular;  Laterality: N/A;   SPINAL FUSION  2006   TUBAL LIGATION     Patient Active Problem List   Diagnosis Date Noted   CAD (coronary artery disease),  native coronary artery 12/23/2015   Angina pectoris (HCC) 12/22/2015   Chest pain 10/30/2013   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) 10/17/2013   Dyspnea 09/21/2013   Smoker 09/21/2013   HBP (high blood pressure) 09/21/2013   Myofascial muscle pain 08/20/2013   Basilar artery narrowing 07/28/2012   Anxiety and depression 05/30/2012   Abdominal pain 12/01/2011   Concussion    Diabetes mellitus (HCC)    Paroxysmal vertigo    Umbilical hernia 04/06/2011   Post concussive syndrome 03/29/2011   Benign paroxysmal positional vertigo 01/27/2011    PCP: Noberto Retort, MD  REFERRING PROVIDER: Rodolph Bong, MD  REFERRING DIAG:  Diagnosis  M25.511 (ICD-10-CM) - Right shoulder pain, unspecified chronicity  M54.2 (ICD-10-CM) - Cervicalgia    THERAPY DIAG:  Chronic right shoulder pain  Cervicalgia  Localized  edema  Muscle weakness (generalized)  Stiffness of right shoulder, not elsewhere classified  Rationale for Evaluation and Treatment: Rehabilitation  ONSET DATE: 04/28/23  SUBJECTIVE:                                                                                                                                                                                      SUBJECTIVE STATEMENT: Shoulder is doing a lot better. Pt reports receiving an injection in her shoulder last week and it has really helped. Hand dominance: Right  PERTINENT HISTORY: Chronic pain Per referring physician note: 67 y.o. female with pain located in the right periscapular and shoulder region.  I believe the majority of her pain is muscle dysfunction of the trapezius and periscapular muscles.  She may have some shoulder impingement or bursitis.  I do not think there is much cervical radiculopathy but that could be a component as well.   PAIN:  Are you having pain? Yes: NPRS scale: 0/10 Pain location: R shoulder into back and under her arm. Pain description: Can be sharp, constant burning Aggravating factors: lying on it, lying on her back, moving it "the wrong way" Relieving factors: Bio Freeze eases pain temporarily.  PRECAUTIONS: Shoulder  RED FLAGS: None   WEIGHT BEARING RESTRICTIONS: No  FALLS:  Has patient fallen in last 6 months? No  LIVING ENVIRONMENT: Lives with: lives with their family Lives in: House/apartment Stairs: Yes: External: 3 steps; none Has following equipment at home: None  OCCUPATION: Retired  PLOF: Independent, the pain is starting to affect her ability to brush her teeth or brush hair, etc.  PATIENT GOALS:Patient would like to get some pain relief to return to her normal activities.  NEXT MD VISIT:   OBJECTIVE:  Note: Objective measures were completed at Evaluation unless otherwise noted.  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  No acute fracture.   Right shoulder: AC DJD.  No severe glenohumeral DJD.  No acute fractures.  PATIENT SURVEYS:  Quick Dash 70.5%  COGNITION: Overall cognitive status: Within functional limits for tasks assessed     SENSATION: WFL  POSTURE: Rounded shoulders, forward head, increased thoracic kyphosis  CERVICAL ROM: 80% flex/ext, mild pain in ext, 50% B lat flex, mild pain, B rotation 60%  CERVICAL SPECIAL TESTS: Spurling's (+) B, R >L  UPPER EXTREMITY ROM: L WNL, R elbow ext WNL, but felt tightness into neck   Active ROM Right eval Left eval  Shoulder flexion 82   Shoulder extension    Shoulder abduction 58   Shoulder adduction    Shoulder internal rotation wfl   Shoulder external rotation 39   Elbow flexion    Elbow extension    Wrist flexion    Wrist extension    Wrist ulnar deviation    Wrist radial deviation    Wrist pronation    Wrist supination    (Blank rows = not tested)  UPPER EXTREMITY MMT: R deferred due to pain, L shoulder 3+/5 due to pain in R shoulder and neck   SHOULDER SPECIAL TESTS: All deferred due to inability to tolerate test positions. Impingement tests:  SLAP lesions:  Instability tests:  Rotator cuff assessment:  Biceps assessment:   JOINT MOBILITY TESTING:  Unable to accurately assess due to pain.  PALPATION:  Increased spasm throughout cervical paraspinals, UT, R scalenes, Pect B, R >L, basically entire upper quadrant on R.   TODAY'S TREATMENT:                                                                                                                                         DATE:  06/07/23 UBE L1.5 x 2 min each AAROM Flex, Ext, IR up back 1lb WaTE x10 AROM shoulder abduction  R shoulder PROM with end range holds  GH jt mobs grades 2-3  05/23/23 Education  PATIENT EDUCATION: Education details: POC Person educated:  Patient Education method: Explanation Education comprehension: verbalized understanding  HOME EXERCISE PROGRAM:  [id number]  ASSESSMENT:  CLINICAL IMPRESSION: Patient is a 67 y.o. who was seen today for physical therapy evaluation and treatment for R shoulder pain.Pt enters feeling well reporting decrease pain since receiving injection. She is very hesitant and guarded with motion of her RUE requiring encouragement with all activity. R shoulder and anterior delt and pec tightness present with palpation. She will greatly benefit from PT as her symptoms are currently exacerbating and causing pain with any activity, Pt is already showing signs of frozen shoulder.   OBJECTIVE IMPAIRMENTS: decreased activity tolerance, decreased coordination, decreased ROM, decreased strength, increased muscle spasms, impaired flexibility, impaired UE functional use, improper  body mechanics, and pain.   ACTIVITY LIMITATIONS: carrying, lifting, bathing, and reach over head  PARTICIPATION LIMITATIONS: meal prep, cleaning, laundry, shopping, and community activity  PERSONAL FACTORS: Past/current experiences are also affecting patient's functional outcome.   REHAB POTENTIAL: Good  CLINICAL DECISION MAKING: Stable/uncomplicated  EVALUATION COMPLEXITY: Moderate   GOALS: Goals reviewed with patient? Yes  SHORT TERM GOALS: Target date: 06/06/23  I with initial HEP Baseline: Goal status: INITIAL  LONG TERM GOALS: Target date: 08/01/23  I with final HEP Baseline:  Goal status: INITIAL  2.  Achieve R shoulder AROM equal to L with pain < 3/10 Baseline:  Goal status: INITIAL  3.  R shoulder strength to at least 4/5 Baseline:  Goal status: INITIAL  4.  Improve QUICK DASH score by at least 35% Baseline:  Goal status: INITIAL  5.  Patient will report the ability to complete all self care and daily tasks using her R UE normally, with pain < 3/10 at the end of the day. Baseline:  Goal status:  INITIAL  PLAN:  PT FREQUENCY: 2x/week  PT DURATION: 10 weeks  PLANNED INTERVENTIONS: 97110-Therapeutic exercises, 97530- Therapeutic activity, [id number]- Neuromuscular re-education, 97535- Self Care, 40981- Manual therapy, 97014- Electrical stimulation (unattended), 97016- Vasopneumatic device, [id number]- Traction (mechanical), [id number]- Ionotophoresis 4mg /ml Dexamethasone, Patient/Family education, Taping, Dry Needling, Joint mobilization, Spinal mobilization, Cryotherapy, and Moist heat  PLAN FOR NEXT SESSION: Treatment for acute pain relief and muscle spasms, update HEP as tolerated.   Iona Beard, DPT 06/07/2023, 2:31 PM

## 2023-06-09 ENCOUNTER — Ambulatory Visit: Payer: Medicare HMO | Admitting: Physical Therapy

## 2023-06-13 ENCOUNTER — Ambulatory Visit: Payer: Medicare HMO | Admitting: Physical Therapy

## 2023-06-15 ENCOUNTER — Ambulatory Visit: Payer: Medicare HMO | Admitting: Physical Therapy

## 2023-06-20 ENCOUNTER — Ambulatory Visit: Payer: Medicare HMO | Admitting: Physical Therapy

## 2023-06-22 ENCOUNTER — Ambulatory Visit: Payer: Medicare HMO | Admitting: Physical Therapy

## 2023-07-04 ENCOUNTER — Ambulatory Visit: Payer: Medicare HMO | Admitting: Physical Therapy

## 2023-07-05 DIAGNOSIS — F4323 Adjustment disorder with mixed anxiety and depressed mood: Secondary | ICD-10-CM | POA: Diagnosis not present

## 2023-07-07 ENCOUNTER — Ambulatory Visit: Payer: Medicare HMO | Admitting: Physical Therapy

## 2023-07-07 ENCOUNTER — Ambulatory Visit: Payer: Medicare HMO | Admitting: Cardiology

## 2023-08-01 ENCOUNTER — Ambulatory Visit: Payer: Medicare HMO | Attending: Cardiology | Admitting: Cardiology

## 2023-08-04 DIAGNOSIS — L739 Follicular disorder, unspecified: Secondary | ICD-10-CM | POA: Diagnosis not present

## 2023-08-04 DIAGNOSIS — R300 Dysuria: Secondary | ICD-10-CM | POA: Diagnosis not present

## 2023-08-04 DIAGNOSIS — G894 Chronic pain syndrome: Secondary | ICD-10-CM | POA: Diagnosis not present

## 2023-08-05 DIAGNOSIS — F4323 Adjustment disorder with mixed anxiety and depressed mood: Secondary | ICD-10-CM | POA: Diagnosis not present

## 2023-09-02 DIAGNOSIS — F4323 Adjustment disorder with mixed anxiety and depressed mood: Secondary | ICD-10-CM | POA: Diagnosis not present

## 2023-09-07 DIAGNOSIS — Z1330 Encounter for screening examination for mental health and behavioral disorders, unspecified: Secondary | ICD-10-CM | POA: Diagnosis not present

## 2023-09-07 DIAGNOSIS — M545 Low back pain, unspecified: Secondary | ICD-10-CM | POA: Diagnosis not present

## 2023-09-07 DIAGNOSIS — G8929 Other chronic pain: Secondary | ICD-10-CM | POA: Diagnosis not present

## 2023-09-07 DIAGNOSIS — G894 Chronic pain syndrome: Secondary | ICD-10-CM | POA: Diagnosis not present

## 2023-09-17 DIAGNOSIS — M545 Low back pain, unspecified: Secondary | ICD-10-CM | POA: Diagnosis not present

## 2023-09-17 DIAGNOSIS — M5134 Other intervertebral disc degeneration, thoracic region: Secondary | ICD-10-CM | POA: Diagnosis not present

## 2023-09-17 DIAGNOSIS — Z6827 Body mass index (BMI) 27.0-27.9, adult: Secondary | ICD-10-CM | POA: Diagnosis not present

## 2023-09-17 DIAGNOSIS — I10 Essential (primary) hypertension: Secondary | ICD-10-CM | POA: Diagnosis not present

## 2023-09-17 DIAGNOSIS — M51379 Other intervertebral disc degeneration, lumbosacral region without mention of lumbar back pain or lower extremity pain: Secondary | ICD-10-CM | POA: Diagnosis not present

## 2023-09-17 DIAGNOSIS — G8929 Other chronic pain: Secondary | ICD-10-CM | POA: Diagnosis not present

## 2023-09-17 DIAGNOSIS — E118 Type 2 diabetes mellitus with unspecified complications: Secondary | ICD-10-CM | POA: Diagnosis not present

## 2023-09-17 DIAGNOSIS — M549 Dorsalgia, unspecified: Secondary | ICD-10-CM | POA: Diagnosis not present

## 2023-09-17 DIAGNOSIS — Z9889 Other specified postprocedural states: Secondary | ICD-10-CM | POA: Diagnosis not present

## 2023-09-17 DIAGNOSIS — M129 Arthropathy, unspecified: Secondary | ICD-10-CM | POA: Diagnosis not present

## 2023-09-17 DIAGNOSIS — Z79899 Other long term (current) drug therapy: Secondary | ICD-10-CM | POA: Diagnosis not present

## 2023-09-29 DIAGNOSIS — I10 Essential (primary) hypertension: Secondary | ICD-10-CM | POA: Diagnosis not present

## 2023-09-29 DIAGNOSIS — M545 Low back pain, unspecified: Secondary | ICD-10-CM | POA: Diagnosis not present

## 2023-09-29 DIAGNOSIS — M51379 Other intervertebral disc degeneration, lumbosacral region without mention of lumbar back pain or lower extremity pain: Secondary | ICD-10-CM | POA: Diagnosis not present

## 2023-09-29 DIAGNOSIS — Z6827 Body mass index (BMI) 27.0-27.9, adult: Secondary | ICD-10-CM | POA: Diagnosis not present

## 2023-09-29 DIAGNOSIS — M5134 Other intervertebral disc degeneration, thoracic region: Secondary | ICD-10-CM | POA: Diagnosis not present

## 2023-09-29 DIAGNOSIS — M549 Dorsalgia, unspecified: Secondary | ICD-10-CM | POA: Diagnosis not present

## 2023-09-29 DIAGNOSIS — Z9889 Other specified postprocedural states: Secondary | ICD-10-CM | POA: Diagnosis not present

## 2023-09-29 DIAGNOSIS — Z79899 Other long term (current) drug therapy: Secondary | ICD-10-CM | POA: Diagnosis not present

## 2023-09-29 DIAGNOSIS — G8929 Other chronic pain: Secondary | ICD-10-CM | POA: Diagnosis not present

## 2023-10-03 DIAGNOSIS — F4323 Adjustment disorder with mixed anxiety and depressed mood: Secondary | ICD-10-CM | POA: Diagnosis not present

## 2023-10-25 DIAGNOSIS — E103292 Type 1 diabetes mellitus with mild nonproliferative diabetic retinopathy without macular edema, left eye: Secondary | ICD-10-CM | POA: Diagnosis not present

## 2023-10-25 DIAGNOSIS — L739 Follicular disorder, unspecified: Secondary | ICD-10-CM | POA: Diagnosis not present

## 2023-10-25 DIAGNOSIS — E7800 Pure hypercholesterolemia, unspecified: Secondary | ICD-10-CM | POA: Diagnosis not present

## 2023-10-25 DIAGNOSIS — F17200 Nicotine dependence, unspecified, uncomplicated: Secondary | ICD-10-CM | POA: Diagnosis not present

## 2023-10-25 DIAGNOSIS — F0781 Postconcussional syndrome: Secondary | ICD-10-CM | POA: Diagnosis not present

## 2023-10-25 DIAGNOSIS — F419 Anxiety disorder, unspecified: Secondary | ICD-10-CM | POA: Diagnosis not present

## 2023-10-25 DIAGNOSIS — K219 Gastro-esophageal reflux disease without esophagitis: Secondary | ICD-10-CM | POA: Diagnosis not present

## 2023-10-25 DIAGNOSIS — R300 Dysuria: Secondary | ICD-10-CM | POA: Diagnosis not present

## 2023-10-25 DIAGNOSIS — G894 Chronic pain syndrome: Secondary | ICD-10-CM | POA: Diagnosis not present

## 2023-10-25 DIAGNOSIS — I25118 Atherosclerotic heart disease of native coronary artery with other forms of angina pectoris: Secondary | ICD-10-CM | POA: Diagnosis not present

## 2023-10-25 DIAGNOSIS — I10 Essential (primary) hypertension: Secondary | ICD-10-CM | POA: Diagnosis not present

## 2023-10-26 DIAGNOSIS — M545 Low back pain, unspecified: Secondary | ICD-10-CM | POA: Diagnosis not present

## 2023-10-26 DIAGNOSIS — M549 Dorsalgia, unspecified: Secondary | ICD-10-CM | POA: Diagnosis not present

## 2023-10-26 DIAGNOSIS — G8929 Other chronic pain: Secondary | ICD-10-CM | POA: Diagnosis not present

## 2023-10-26 DIAGNOSIS — M51379 Other intervertebral disc degeneration, lumbosacral region without mention of lumbar back pain or lower extremity pain: Secondary | ICD-10-CM | POA: Diagnosis not present

## 2023-10-26 DIAGNOSIS — Z79899 Other long term (current) drug therapy: Secondary | ICD-10-CM | POA: Diagnosis not present

## 2023-10-26 DIAGNOSIS — Z6827 Body mass index (BMI) 27.0-27.9, adult: Secondary | ICD-10-CM | POA: Diagnosis not present

## 2023-10-26 DIAGNOSIS — M5134 Other intervertebral disc degeneration, thoracic region: Secondary | ICD-10-CM | POA: Diagnosis not present

## 2023-10-26 DIAGNOSIS — I10 Essential (primary) hypertension: Secondary | ICD-10-CM | POA: Diagnosis not present

## 2023-10-26 DIAGNOSIS — Z9889 Other specified postprocedural states: Secondary | ICD-10-CM | POA: Diagnosis not present

## 2023-11-02 DIAGNOSIS — F4323 Adjustment disorder with mixed anxiety and depressed mood: Secondary | ICD-10-CM | POA: Diagnosis not present

## 2023-11-16 DIAGNOSIS — E119 Type 2 diabetes mellitus without complications: Secondary | ICD-10-CM | POA: Diagnosis not present

## 2023-11-25 DIAGNOSIS — R030 Elevated blood-pressure reading, without diagnosis of hypertension: Secondary | ICD-10-CM | POA: Diagnosis not present

## 2023-11-25 DIAGNOSIS — G8929 Other chronic pain: Secondary | ICD-10-CM | POA: Diagnosis not present

## 2023-11-25 DIAGNOSIS — M545 Low back pain, unspecified: Secondary | ICD-10-CM | POA: Diagnosis not present

## 2023-11-25 DIAGNOSIS — M549 Dorsalgia, unspecified: Secondary | ICD-10-CM | POA: Diagnosis not present

## 2023-11-25 DIAGNOSIS — Z6826 Body mass index (BMI) 26.0-26.9, adult: Secondary | ICD-10-CM | POA: Diagnosis not present

## 2023-11-25 DIAGNOSIS — Z79899 Other long term (current) drug therapy: Secondary | ICD-10-CM | POA: Diagnosis not present

## 2023-11-25 DIAGNOSIS — I10 Essential (primary) hypertension: Secondary | ICD-10-CM | POA: Diagnosis not present

## 2023-11-25 DIAGNOSIS — Z9889 Other specified postprocedural states: Secondary | ICD-10-CM | POA: Diagnosis not present

## 2023-11-25 DIAGNOSIS — M5134 Other intervertebral disc degeneration, thoracic region: Secondary | ICD-10-CM | POA: Diagnosis not present

## 2023-11-25 DIAGNOSIS — M51379 Other intervertebral disc degeneration, lumbosacral region without mention of lumbar back pain or lower extremity pain: Secondary | ICD-10-CM | POA: Diagnosis not present

## 2023-12-31 ENCOUNTER — Encounter (HOSPITAL_COMMUNITY): Payer: Self-pay | Admitting: Interventional Radiology

## 2024-01-03 DIAGNOSIS — Z6826 Body mass index (BMI) 26.0-26.9, adult: Secondary | ICD-10-CM | POA: Diagnosis not present

## 2024-01-03 DIAGNOSIS — M5134 Other intervertebral disc degeneration, thoracic region: Secondary | ICD-10-CM | POA: Diagnosis not present

## 2024-01-03 DIAGNOSIS — M549 Dorsalgia, unspecified: Secondary | ICD-10-CM | POA: Diagnosis not present

## 2024-01-03 DIAGNOSIS — Z79899 Other long term (current) drug therapy: Secondary | ICD-10-CM | POA: Diagnosis not present

## 2024-01-03 DIAGNOSIS — Z9889 Other specified postprocedural states: Secondary | ICD-10-CM | POA: Diagnosis not present

## 2024-01-03 DIAGNOSIS — M51379 Other intervertebral disc degeneration, lumbosacral region without mention of lumbar back pain or lower extremity pain: Secondary | ICD-10-CM | POA: Diagnosis not present

## 2024-01-03 DIAGNOSIS — E118 Type 2 diabetes mellitus with unspecified complications: Secondary | ICD-10-CM | POA: Diagnosis not present

## 2024-01-03 DIAGNOSIS — I10 Essential (primary) hypertension: Secondary | ICD-10-CM | POA: Diagnosis not present

## 2024-01-03 DIAGNOSIS — M545 Low back pain, unspecified: Secondary | ICD-10-CM | POA: Diagnosis not present

## 2024-01-03 DIAGNOSIS — G8929 Other chronic pain: Secondary | ICD-10-CM | POA: Diagnosis not present

## 2024-01-09 DIAGNOSIS — Z79899 Other long term (current) drug therapy: Secondary | ICD-10-CM | POA: Diagnosis not present

## 2024-02-03 DIAGNOSIS — M549 Dorsalgia, unspecified: Secondary | ICD-10-CM | POA: Diagnosis not present

## 2024-02-03 DIAGNOSIS — G8929 Other chronic pain: Secondary | ICD-10-CM | POA: Diagnosis not present

## 2024-02-03 DIAGNOSIS — Z6826 Body mass index (BMI) 26.0-26.9, adult: Secondary | ICD-10-CM | POA: Diagnosis not present

## 2024-02-03 DIAGNOSIS — Z9889 Other specified postprocedural states: Secondary | ICD-10-CM | POA: Diagnosis not present

## 2024-02-03 DIAGNOSIS — E118 Type 2 diabetes mellitus with unspecified complications: Secondary | ICD-10-CM | POA: Diagnosis not present

## 2024-02-03 DIAGNOSIS — M545 Low back pain, unspecified: Secondary | ICD-10-CM | POA: Diagnosis not present

## 2024-02-03 DIAGNOSIS — Z79899 Other long term (current) drug therapy: Secondary | ICD-10-CM | POA: Diagnosis not present

## 2024-02-03 DIAGNOSIS — M5134 Other intervertebral disc degeneration, thoracic region: Secondary | ICD-10-CM | POA: Diagnosis not present

## 2024-02-03 DIAGNOSIS — I10 Essential (primary) hypertension: Secondary | ICD-10-CM | POA: Diagnosis not present

## 2024-02-03 DIAGNOSIS — M51379 Other intervertebral disc degeneration, lumbosacral region without mention of lumbar back pain or lower extremity pain: Secondary | ICD-10-CM | POA: Diagnosis not present

## 2024-02-08 DIAGNOSIS — Z79899 Other long term (current) drug therapy: Secondary | ICD-10-CM | POA: Diagnosis not present

## 2024-03-06 DIAGNOSIS — Z79899 Other long term (current) drug therapy: Secondary | ICD-10-CM | POA: Diagnosis not present

## 2024-03-06 DIAGNOSIS — M545 Low back pain, unspecified: Secondary | ICD-10-CM | POA: Diagnosis not present

## 2024-03-06 DIAGNOSIS — Z9889 Other specified postprocedural states: Secondary | ICD-10-CM | POA: Diagnosis not present

## 2024-03-06 DIAGNOSIS — M51379 Other intervertebral disc degeneration, lumbosacral region without mention of lumbar back pain or lower extremity pain: Secondary | ICD-10-CM | POA: Diagnosis not present

## 2024-03-06 DIAGNOSIS — M549 Dorsalgia, unspecified: Secondary | ICD-10-CM | POA: Diagnosis not present

## 2024-03-06 DIAGNOSIS — G8929 Other chronic pain: Secondary | ICD-10-CM | POA: Diagnosis not present

## 2024-03-06 DIAGNOSIS — I10 Essential (primary) hypertension: Secondary | ICD-10-CM | POA: Diagnosis not present

## 2024-03-06 DIAGNOSIS — Z6827 Body mass index (BMI) 27.0-27.9, adult: Secondary | ICD-10-CM | POA: Diagnosis not present

## 2024-03-06 DIAGNOSIS — M5134 Other intervertebral disc degeneration, thoracic region: Secondary | ICD-10-CM | POA: Diagnosis not present

## 2024-03-09 DIAGNOSIS — Z79899 Other long term (current) drug therapy: Secondary | ICD-10-CM | POA: Diagnosis not present

## 2024-04-10 DIAGNOSIS — Z6826 Body mass index (BMI) 26.0-26.9, adult: Secondary | ICD-10-CM | POA: Diagnosis not present

## 2024-04-10 DIAGNOSIS — B379 Candidiasis, unspecified: Secondary | ICD-10-CM | POA: Diagnosis not present

## 2024-04-10 DIAGNOSIS — Z79899 Other long term (current) drug therapy: Secondary | ICD-10-CM | POA: Diagnosis not present

## 2024-04-10 DIAGNOSIS — I10 Essential (primary) hypertension: Secondary | ICD-10-CM | POA: Diagnosis not present

## 2024-04-10 DIAGNOSIS — M549 Dorsalgia, unspecified: Secondary | ICD-10-CM | POA: Diagnosis not present

## 2024-04-10 DIAGNOSIS — Z9889 Other specified postprocedural states: Secondary | ICD-10-CM | POA: Diagnosis not present

## 2024-04-10 DIAGNOSIS — A499 Bacterial infection, unspecified: Secondary | ICD-10-CM | POA: Diagnosis not present

## 2024-04-10 DIAGNOSIS — E118 Type 2 diabetes mellitus with unspecified complications: Secondary | ICD-10-CM | POA: Diagnosis not present

## 2024-04-10 DIAGNOSIS — N390 Urinary tract infection, site not specified: Secondary | ICD-10-CM | POA: Diagnosis not present

## 2024-04-10 DIAGNOSIS — M5134 Other intervertebral disc degeneration, thoracic region: Secondary | ICD-10-CM | POA: Diagnosis not present

## 2024-04-17 DIAGNOSIS — N390 Urinary tract infection, site not specified: Secondary | ICD-10-CM | POA: Diagnosis not present

## 2024-04-17 DIAGNOSIS — R300 Dysuria: Secondary | ICD-10-CM | POA: Diagnosis not present

## 2024-04-17 DIAGNOSIS — A499 Bacterial infection, unspecified: Secondary | ICD-10-CM | POA: Diagnosis not present

## 2024-04-17 DIAGNOSIS — Z6826 Body mass index (BMI) 26.0-26.9, adult: Secondary | ICD-10-CM | POA: Diagnosis not present

## 2024-05-03 DIAGNOSIS — Z0000 Encounter for general adult medical examination without abnormal findings: Secondary | ICD-10-CM | POA: Diagnosis not present

## 2024-05-07 ENCOUNTER — Other Ambulatory Visit: Payer: Self-pay | Admitting: Family Medicine

## 2024-05-07 DIAGNOSIS — Z1231 Encounter for screening mammogram for malignant neoplasm of breast: Secondary | ICD-10-CM

## 2024-05-11 DIAGNOSIS — Z6826 Body mass index (BMI) 26.0-26.9, adult: Secondary | ICD-10-CM | POA: Diagnosis not present

## 2024-05-11 DIAGNOSIS — M5134 Other intervertebral disc degeneration, thoracic region: Secondary | ICD-10-CM | POA: Diagnosis not present

## 2024-05-11 DIAGNOSIS — M549 Dorsalgia, unspecified: Secondary | ICD-10-CM | POA: Diagnosis not present

## 2024-05-11 DIAGNOSIS — Z9889 Other specified postprocedural states: Secondary | ICD-10-CM | POA: Diagnosis not present

## 2024-05-11 DIAGNOSIS — R030 Elevated blood-pressure reading, without diagnosis of hypertension: Secondary | ICD-10-CM | POA: Diagnosis not present

## 2024-05-11 DIAGNOSIS — Z79899 Other long term (current) drug therapy: Secondary | ICD-10-CM | POA: Diagnosis not present

## 2024-05-16 ENCOUNTER — Ambulatory Visit: Admitting: Family Medicine

## 2024-05-16 NOTE — Progress Notes (Deleted)
   LILLETTE Ileana Collet, PhD, LAT, ATC acting as a scribe for Artist Lloyd, MD.  MAHEK SCHLESINGER is a 68 y.o. female who presents to [facility] at [facility] today for cont'd R shoulder pain. Pt was last seen by Dr. Lloyd on 05/30/23 and was given a R subacromial steroid injection  Today, pt reports ***  Dx testing: 04/28/23 R shoulder & C-spine XR 07/18/21 C-spine MRI   Pertinent review of systems: ***  Relevant historical information: ***   Exam:  There were no vitals taken for this visit. General: Well Developed, well nourished, and in no acute distress.   MSK: ***    Lab and Radiology Results No results found for this or any previous visit (from the past 72 hours). No results found.     Assessment and Plan: 68 y.o. female with ***   PDMP not reviewed this encounter. No orders of the defined types were placed in this encounter.  No orders of the defined types were placed in this encounter.    Discussed warning signs or symptoms. Please see discharge instructions. Patient expresses understanding.   ***

## 2024-05-28 ENCOUNTER — Ambulatory Visit

## 2024-05-28 ENCOUNTER — Ambulatory Visit: Admitting: Family Medicine

## 2024-05-28 ENCOUNTER — Other Ambulatory Visit: Payer: Self-pay

## 2024-05-28 VITALS — BP 122/60 | HR 75 | Ht 65.0 in | Wt 152.0 lb

## 2024-05-28 DIAGNOSIS — M25511 Pain in right shoulder: Secondary | ICD-10-CM | POA: Diagnosis not present

## 2024-05-28 DIAGNOSIS — G8929 Other chronic pain: Secondary | ICD-10-CM

## 2024-05-28 DIAGNOSIS — M25562 Pain in left knee: Secondary | ICD-10-CM | POA: Diagnosis not present

## 2024-05-28 DIAGNOSIS — M25561 Pain in right knee: Secondary | ICD-10-CM

## 2024-05-28 NOTE — Progress Notes (Signed)
 I, [name] am a scribe for Dr. Artist Lloyd, MD.  Mallory Mendez is a 68 y.o. female who presents to [facility] at [facility] today for cont'd R shoulder pain. Pt was last seen by Dr. Lloyd on 05/30/23 and was given a R subacromial steroid injection  Today, pt reports that she would like to get another injection in the shoulder. The shot lasted for a good year. When she is driving and turn the wheel she thinks it is aggravating the shoulder.   She also notes bilateral knee pain right worse than left.  She is interested in injections for that as well.  Dx testing: 04/28/23 R shoulder & C-spine XR 07/18/21 C-spine MRI   Pertinent review of systems: No fever or chills  Relevant historical information: Diabetes managed with insulin .  She notes her primary care provider checked her A1c recently and it was in the sixes.   Exam:  BP 122/60   Pulse 75   Ht 5' 5 (1.651 m)   Wt 152 lb (68.9 kg)   SpO2 98%   BMI 25.29 kg/m  General: Well Developed, well nourished, and in no acute distress.   MSK: Right shoulder normal appearing normal motion pain with abduction intact strength.  Right knee mild effusion normal-appearing otherwise normal motion.  Tender palpation medial joint line.    Lab and Radiology Results  Procedure: Real-time Ultrasound Guided Injection of right shoulder subacromial bursa Device: Philips Affiniti 50G/GE Logiq Images permanently stored and available for review in PACS Verbal informed consent obtained.  Discussed risks and benefits of procedure. Warned about infection, bleeding, hyperglycemia damage to structures among others. Patient expresses understanding and agreement Time-out conducted.   Noted no overlying erythema, induration, or other signs of local infection.   Skin prepped in a sterile fashion.   Local anesthesia: Topical Ethyl chloride.   With sterile technique and under real time ultrasound guidance: 40 mg of Kenalog  and  2 mL of Marcaine  injected into subacromial bursa. Fluid seen entering the bursa.   Completed without difficulty   Pain immediately resolved suggesting accurate placement of the medication.   Advised to call if fevers/chills, erythema, induration, drainage, or persistent bleeding.   Images permanently stored and available for review in the ultrasound unit.  Impression: Technically successful ultrasound guided injection.    Procedure: Real-time Ultrasound Guided Injection of right knee joint superior lateral patella space Device: Philips Affiniti 50G/GE Logiq Images permanently stored and available for review in PACS Verbal informed consent obtained.  Discussed risks and benefits of procedure. Warned about infection, bleeding, hyperglycemia damage to structures among others. Patient expresses understanding and agreement Time-out conducted.   Noted no overlying erythema, induration, or other signs of local infection.   Skin prepped in a sterile fashion.   Local anesthesia: Topical Ethyl chloride.   With sterile technique and under real time ultrasound guidance: 40 mg of Kenalog  and 2 mL of Marcaine  injected into knee joint. Fluid seen entering the joint capsule.   Completed without difficulty   Pain immediately resolved suggesting accurate placement of the medication.   Advised to call if fevers/chills, erythema, induration, drainage, or persistent bleeding.   Images permanently stored and available for review in the ultrasound unit.  Impression: Technically successful ultrasound guided injection.    X-ray images bilateral knees obtained today personally and independently interpreted.  Right knee: Minimal degenerative changes  Left knee: Normal degenerative changes.  Await formal radiology review   Assessment and Plan: 68  y.o. female with right shoulder pain due to rotator cuff impingement.  Patient had great response to subacromial injection last year.  Plan for repeat subacromial  injection today.  Bilateral knee pain right worse than left.  Patient does have a little bit of degenerative changes which could explain her pain.  Plan for right knee injection today.  We did talk about safety of 2 steroid injections with diabetes.  Her blood sugar is pretty well-controlled with an A1c less than 7 and she does have insulin  that she can adjust in response to blood sugar and does know how to do that.   PDMP not reviewed this encounter. Orders Placed This Encounter  Procedures   US  LIMITED JOINT SPACE STRUCTURES UP RIGHT(NO LINKED CHARGES)    Reason for Exam (SYMPTOM  OR DIAGNOSIS REQUIRED):   shoulder pain    Preferred imaging location?:   [facility] Knee AP/LAT W/Sunrise Left    Standing Status:   Future    Expiration Date:   05/28/2025    Reason for Exam (SYMPTOM  OR DIAGNOSIS REQUIRED):   bilateral knee pain    Preferred imaging location?:   [facility]   DG Knee AP/LAT W/Sunrise Right    Standing Status:   Future    Expiration Date:   06/27/2024    Reason for Exam (SYMPTOM  OR DIAGNOSIS REQUIRED):   bilateral knee pain    Preferred imaging location?:   [facility]   No orders of the defined types were placed in this encounter.    Discussed warning signs or symptoms. Please see discharge instructions. Patient expresses understanding.   The above documentation has been reviewed and is accurate and complete Artist Lloyd, M.D.

## 2024-05-28 NOTE — Patient Instructions (Addendum)
 Thank you for coming in today.   Please get an Xray today before you leave   You received an injection today. Seek immediate medical attention if the joint becomes red, extremely painful, or is oozing fluid.   OK to adjust insulin  dose as needed  Check back as needed

## 2024-06-04 ENCOUNTER — Ambulatory Visit: Payer: Self-pay | Admitting: Family Medicine

## 2024-06-04 NOTE — Progress Notes (Signed)
Left knee x-ray shows minimal arthritis

## 2024-06-04 NOTE — Progress Notes (Signed)
Right knee x-ray shows minimal arthritis.

## 2024-06-10 DIAGNOSIS — Z79899 Other long term (current) drug therapy: Secondary | ICD-10-CM | POA: Diagnosis not present

## 2024-06-10 DIAGNOSIS — F1120 Opioid dependence, uncomplicated: Secondary | ICD-10-CM | POA: Diagnosis not present

## 2024-06-10 DIAGNOSIS — Z79891 Long term (current) use of opiate analgesic: Secondary | ICD-10-CM | POA: Diagnosis not present

## 2024-06-10 DIAGNOSIS — M5134 Other intervertebral disc degeneration, thoracic region: Secondary | ICD-10-CM | POA: Diagnosis not present

## 2024-06-10 DIAGNOSIS — Z6826 Body mass index (BMI) 26.0-26.9, adult: Secondary | ICD-10-CM | POA: Diagnosis not present

## 2024-07-03 ENCOUNTER — Ambulatory Visit

## 2024-07-06 ENCOUNTER — Ambulatory Visit
Admission: RE | Admit: 2024-07-06 | Discharge: 2024-07-06 | Disposition: A | Payer: Medicare (Managed Care) | Source: Ambulatory Visit | Attending: Family Medicine | Admitting: Family Medicine

## 2024-07-06 DIAGNOSIS — Z1231 Encounter for screening mammogram for malignant neoplasm of breast: Secondary | ICD-10-CM

## 2024-08-01 ENCOUNTER — Emergency Department (HOSPITAL_COMMUNITY): Payer: Medicare (Managed Care)

## 2024-08-01 ENCOUNTER — Other Ambulatory Visit: Payer: Self-pay

## 2024-08-01 ENCOUNTER — Encounter (HOSPITAL_COMMUNITY): Payer: Self-pay

## 2024-08-01 ENCOUNTER — Emergency Department (HOSPITAL_COMMUNITY)
Admission: EM | Admit: 2024-08-01 | Discharge: 2024-08-02 | Disposition: A | Discharge status: Home / Self Care | Payer: Medicare (Managed Care) | Attending: Emergency Medicine | Admitting: Emergency Medicine

## 2024-08-01 DIAGNOSIS — I10 Essential (primary) hypertension: Secondary | ICD-10-CM | POA: Insufficient documentation

## 2024-08-01 DIAGNOSIS — S93402A Sprain of unspecified ligament of left ankle, initial encounter: Secondary | ICD-10-CM | POA: Insufficient documentation

## 2024-08-01 DIAGNOSIS — Z7982 Long term (current) use of aspirin: Secondary | ICD-10-CM | POA: Diagnosis not present

## 2024-08-01 DIAGNOSIS — Z7984 Long term (current) use of oral hypoglycemic drugs: Secondary | ICD-10-CM | POA: Insufficient documentation

## 2024-08-01 DIAGNOSIS — W000XXA Fall on same level due to ice and snow, initial encounter: Secondary | ICD-10-CM | POA: Insufficient documentation

## 2024-08-01 DIAGNOSIS — E119 Type 2 diabetes mellitus without complications: Secondary | ICD-10-CM | POA: Insufficient documentation

## 2024-08-01 DIAGNOSIS — Z79899 Other long term (current) drug therapy: Secondary | ICD-10-CM | POA: Diagnosis not present

## 2024-08-01 DIAGNOSIS — Z794 Long term (current) use of insulin: Secondary | ICD-10-CM | POA: Diagnosis not present

## 2024-08-01 DIAGNOSIS — S99912A Unspecified injury of left ankle, initial encounter: Secondary | ICD-10-CM | POA: Diagnosis present

## 2024-08-01 DIAGNOSIS — Z91040 Latex allergy status: Secondary | ICD-10-CM | POA: Diagnosis not present

## 2024-08-01 NOTE — ED Provider Notes (Signed)
 " [place] EMERGENCY DEPARTMENT AT [facility] Provider Note   CSN: [id number] Arrival date & time: 08/01/24  1916     Patient presents with: Left Ankle Injury    Mallory Mendez is a 69 y.o. female.   HPI     Mallory Mendez is a 69 y.o. female past medical history of type 2 diabetes, fibromyalgia hypertension who presents to the Emergency Department complaining of mechanical fall on the ice earlier this evening.  She is having pain to her lateral left ankle.  Difficulty with weightbearing secondary to pain.  States she has been walking on the side of my foot she denies other injuries including head injury, low back pain LOC dizziness.  Prior to Admission medications  Medication Sig Start Date End Date Taking? Authorizing Provider  ALPRAZolam  (XANAX ) 1 MG tablet Take 1 mg by mouth 3 (three) times daily as needed for anxiety.  10/01/13   [provider]  amLODipine  (NORVASC ) 10 MG tablet Take 1 tablet (10 mg total) by mouth daily. 10/25/19   Ladona Heinz, MD  aspirin  81 MG chewable tablet Chew 81 mg by mouth daily.    [provider]  atorvastatin  (LIPITOR) 40 MG tablet Take 1 tablet (40 mg total) by mouth daily. 10/25/19   Ladona Heinz, MD  esomeprazole (NEXIUM) 40 MG capsule Take 40 mg by mouth every evening.     [provider]  estradiol  (ESTRACE ) 1 MG tablet Take 1 mg by mouth daily.    [provider]  HUMALOG KWIKPEN 100 UNIT/ML KwikPen SMARTSIG:10-20 Unit(s) SUB-Q 3 Times Daily 04/23/20   [provider]  insulin  glargine (LANTUS SOLOSTAR) 100 UNIT/ML Solostar Pen INJECT 48 units in the AM, and 28units at night Subcutaneous as directed 08/18/22   [provider]  isosorbide  mononitrate (IMDUR ) 30 MG 24 hr tablet Take 1 tablet (30 mg total) by mouth daily. 10/25/19 01/04/23  Ladona Heinz, MD  labetalol  (NORMODYNE ) 100 MG tablet TAKE ONE TABLET BY MOUTH TWICE DAILY 12/22/22   Tolia, Sunit, DO  levocetirizine (XYZAL) 5 MG  tablet Take 5 mg by mouth every evening.    [provider]  losartan-hydrochlorothiazide (HYZAAR) 100-25 MG tablet Take 1 tablet by mouth every morning. 04/23/20   [provider]  meclizine  (ANTIVERT ) 25 MG tablet Take 25 mg by mouth daily as needed. 03/31/20   [provider]  metFORMIN (GLUCOPHAGE) 500 MG tablet Take 500 mg by mouth 2 (two) times daily.    [provider]  nitroGLYCERIN  (NITROSTAT ) 0.4 MG SL tablet Place 0.4 mg under the tongue every 5 (five) minutes as needed for chest pain.    [provider]  oxycodone  (ROXICODONE ) 30 MG immediate release tablet Take 30 mg by mouth every 12 (twelve) hours as needed for pain.    [provider]  OXYCONTIN  30 MG 12 hr tablet 1 tablet Orally every 12 hrs for 30 days 03/11/23   [provider]  ramelteon (ROZEREM) 8 MG tablet Take 8 mg by mouth at bedtime as needed. 04/07/22   [provider]  spironolactone  (ALDACTONE ) 25 MG tablet TAKE ONE TABLET BY MOUTH EVERY MORNING 10/25/22   Tolia, Sunit, DO  tiZANidine  (ZANAFLEX ) 4 MG tablet Take 1 tablet (4 mg total) by mouth every 8 (eight) hours as needed. 04/28/23   Joane Artist RAMAN, MD    Allergies: Cymbalta [duloxetine hcl] and Latex    Review of Systems  Musculoskeletal:  Positive for arthralgias (left ankle pain).  All other systems reviewed and are negative.   Updated Vital Signs BP (!) 177/82   Pulse 88   Temp 99.1 F (37.3 C) (Oral)   Resp 20   SpO2 95%   Physical Exam Vitals and nursing note reviewed.  Constitutional:      General: She is not in acute distress.    Appearance: Normal appearance. She is not toxic-appearing.  HENT:     Head: Atraumatic.  Cardiovascular:     Rate and Rhythm: Normal rate and regular rhythm.  Pulmonary:     Effort: Pulmonary effort is normal.  Musculoskeletal:        General: Tenderness and signs of injury present. No swelling.     Cervical back: No tenderness.     Left ankle: No  swelling, deformity or ecchymosis. Tenderness present over the lateral malleolus. Decreased range of motion. Normal pulse.     Left Achilles Tendon: Normal.     Comments: Ttp lateral left ankle.  No edema or ecchymosis.  No bony deformities.  Compartments of the lower extremity are soft  Skin:    General: Skin is warm.     Capillary Refill: Capillary refill takes less than 2 seconds.  Neurological:     General: No focal deficit present.     Mental Status: She is alert.     Sensory: No sensory deficit.     Motor: No weakness.     (all labs ordered are listed, but only abnormal results are displayed) Labs Reviewed - No data to display  EKG: None  Radiology: DG Ankle Complete Left Result Date: 08/01/2024 CLINICAL DATA:  Trauma to the left ankle. EXAM: LEFT ANKLE COMPLETE - 3+ VIEW COMPARISON:  None Available. FINDINGS: There is no evidence of fracture, dislocation, or joint effusion. There is no evidence of arthropathy or other focal bone abnormality. Soft tissues are unremarkable. IMPRESSION: Negative. Electronically Signed   By: Vanetta Chou M.D.   On: 08/01/2024 20:03     Procedures   Medications Ordered in the ED - No data to display                                  Medical Decision Making  Patient here for evaluation of mechanical fall that occurred earlier this evening, slipped on ice.  Injury to left lateral ankle.  She has been bearing weight but walking on the side of my foot.  Denies any other injuries head injury or LOC.  Does not take blood thinners.  On my exam, patient does have tenderness over the lateral malleolus.  There is no edema or obvious bony deformity on exam.  Will obtain x-ray I suspect sprain but fracture or dislocation also considered  Amount and/or Complexity of Data Reviewed Radiology: ordered.    Details: X-ray negative for acute fracture Discussion of management or test interpretation with external provider(s): Patient does have significant  tenderness to the lateral ankle on exam.  No fracture on x-ray.  Given pt's age prefer not to use crutches.  Will dispense walker and applied cam boot.  Appears appropriate for discharge home, will follow-up with local orthopedics.        Final diagnoses:  Sprain of left ankle, unspecified ligament, initial encounter    ED Discharge Orders     None          Herlinda Milling, PA-C 08/01/24 2337  "

## 2024-08-01 NOTE — ED Triage Notes (Signed)
 Pt POV c/o fall after slipping on ice tonight an d now c/o left ankle pain.

## 2024-08-01 NOTE — Discharge Instructions (Signed)
 Elevate your ankle when possible, ice packs on and off to help with swelling.  Use the walker in your cam boot when standing or walking.  You may remove boot at bedtime and for bathing.  Call Dr. Areatha office to arrange follow-up appointment.
